# Patient Record
Sex: Female | Born: 1980
Health system: Southern US, Community
[De-identification: ages and names within clinical notes are randomized; demographics above are authoritative.]

## PROBLEM LIST (undated history)

## (undated) DIAGNOSIS — F4310 Post-traumatic stress disorder, unspecified: Secondary | ICD-10-CM

## (undated) DIAGNOSIS — K047 Periapical abscess without sinus: Secondary | ICD-10-CM

## (undated) DIAGNOSIS — R002 Palpitations: Secondary | ICD-10-CM

## (undated) DIAGNOSIS — F329 Major depressive disorder, single episode, unspecified: Secondary | ICD-10-CM

## (undated) DIAGNOSIS — F419 Anxiety disorder, unspecified: Secondary | ICD-10-CM

## (undated) DIAGNOSIS — G4700 Insomnia, unspecified: Secondary | ICD-10-CM

## (undated) DIAGNOSIS — F32A Depression, unspecified: Secondary | ICD-10-CM

## (undated) DIAGNOSIS — K219 Gastro-esophageal reflux disease without esophagitis: Secondary | ICD-10-CM

## (undated) DIAGNOSIS — D649 Anemia, unspecified: Secondary | ICD-10-CM

## (undated) HISTORY — DX: Gastro-esophageal reflux disease without esophagitis: K21.9

## (undated) HISTORY — PX: BREAST REDUCTION SURGERY: SHX8

## (undated) HISTORY — DX: Depression, unspecified: F32.A

## (undated) HISTORY — PX: CHOLECYSTECTOMY: SHX55

## (undated) HISTORY — DX: Major depressive disorder, single episode, unspecified: F32.9

## (undated) HISTORY — DX: Anxiety disorder, unspecified: F41.9

## (undated) HISTORY — PX: WISDOM TOOTH EXTRACTION: SHX21

## (undated) HISTORY — DX: Palpitations: R00.2

## (undated) HISTORY — DX: Insomnia, unspecified: G47.00

## (undated) HISTORY — PX: OTHER SURGICAL HISTORY: SHX169

---
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
## 1999-06-05 ENCOUNTER — Encounter: Admission: RE | Admit: 1999-06-05 | Discharge: 1999-09-03 | Payer: Self-pay | Admitting: Pediatrics

## 1999-09-20 ENCOUNTER — Encounter: Admission: RE | Admit: 1999-09-20 | Discharge: 1999-12-19 | Payer: Self-pay | Admitting: Pediatrics

## 2005-09-28 ENCOUNTER — Emergency Department (HOSPITAL_COMMUNITY): Admission: EM | Admit: 2005-09-28 | Discharge: 2005-09-29 | Payer: Self-pay | Admitting: Emergency Medicine

## 2005-10-27 ENCOUNTER — Emergency Department (HOSPITAL_COMMUNITY): Admission: EM | Admit: 2005-10-27 | Discharge: 2005-10-27 | Payer: Self-pay | Admitting: *Deleted

## 2013-04-01 ENCOUNTER — Emergency Department (HOSPITAL_COMMUNITY)
Admission: EM | Admit: 2013-04-01 | Discharge: 2013-04-01 | Disposition: A | Discharge status: Home / Self Care | Payer: Self-pay | Attending: Emergency Medicine | Admitting: Emergency Medicine

## 2013-04-01 ENCOUNTER — Encounter (HOSPITAL_COMMUNITY): Payer: Self-pay | Admitting: Emergency Medicine

## 2013-04-01 DIAGNOSIS — R110 Nausea: Secondary | ICD-10-CM | POA: Insufficient documentation

## 2013-04-01 DIAGNOSIS — Z862 Personal history of diseases of the blood and blood-forming organs and certain disorders involving the immune mechanism: Secondary | ICD-10-CM | POA: Insufficient documentation

## 2013-04-01 DIAGNOSIS — R51 Headache: Secondary | ICD-10-CM | POA: Insufficient documentation

## 2013-04-01 DIAGNOSIS — Z87891 Personal history of nicotine dependence: Secondary | ICD-10-CM | POA: Insufficient documentation

## 2013-04-01 DIAGNOSIS — R42 Dizziness and giddiness: Secondary | ICD-10-CM | POA: Insufficient documentation

## 2013-04-01 DIAGNOSIS — H538 Other visual disturbances: Secondary | ICD-10-CM | POA: Insufficient documentation

## 2013-04-01 DIAGNOSIS — Z8639 Personal history of other endocrine, nutritional and metabolic disease: Secondary | ICD-10-CM | POA: Insufficient documentation

## 2013-04-01 HISTORY — DX: Anemia, unspecified: D64.9

## 2013-04-01 MED ORDER — ASPIRIN-ACETAMINOPHEN-CAFFEINE 250-250-65 MG PO TABS
2.0000 | ORAL_TABLET | Freq: Once | ORAL | Status: AC
  Administered 2013-04-01: 2 via ORAL
  Filled 2013-04-01: qty 2

## 2013-04-01 MED ORDER — ASPIRIN-ACETAMINOPHEN-CAFFEINE 250-250-65 MG PO TABS: 1.0000 | ORAL_TABLET | Freq: Four times a day (QID) | ORAL | Status: DC | PRN

## 2013-04-01 NOTE — ED Notes (Signed)
Pt having a lot of "head pressure" for last couple weeks. Doc out of town, Lowella Grip be back till Wednesday. "Makes me feel faint", c/o nausea, denies vomiting. Pt states she feels off balanced sometimes and a little blurred vision. States shes had headaches before, but this headache "feels different". Pt went to the ER a few weeks ago, dx with fluid in ears. No medications PTA. Pt ambulatory with steady gait to room.

## 2013-04-01 NOTE — ED Provider Notes (Signed)
CSN: [id number]     Arrival date & time 04/01/13  4098 History   First MD Initiated Contact with Patient 04/01/13 [phone]     Chief Complaint  Patient presents with  . Headache   (Consider location/radiation/quality/duration/timing/severity/associated sxs/prior Treatment) HPI Comments: Patient is a 32 yo F PMHx significant for Anemia presenting to the ED for 3 weeks of intermittent generalized headaches that she describes as pressure. She denies having a headache currently. Patient reports intermittent associated nausea w/o vomiting, lightheadness w/o LOC, and one episode of resolving blurred vision. Patient states she feels as though the lightheadedness is related to her anemia as she sometimes has gotten that before when it hasn't been controlled. Patient states she was seen recently and diagnosed with a sinus infection and placed on an antibiotic without any improvement. Patient denies any aggravating factors. She states she tried Tylenol once last night with improvement of her headache, but denies using any other OTC medications prior.    Past Medical History  Diagnosis Date  . Anemia   . High cholesterol    Past Surgical History  Procedure Laterality Date  . Breast reduction surgery    . Gallstone removal    . Wisdom tooth extraction     No family history on file. History  Substance Use Topics  . Smoking status: Former Games developer  . Smokeless tobacco: Not on file  . Alcohol Use: No   OB History   Grav Para Term Preterm Abortions TAB SAB Ect Mult Living                 Review of Systems  Constitutional: Negative for fever.  Eyes: Negative for photophobia, pain and visual disturbance.  Respiratory: Negative for shortness of breath.   Cardiovascular: Negative for chest pain.  Gastrointestinal: Positive for nausea. Negative for vomiting and abdominal pain.  Musculoskeletal: Negative for neck pain.  Neurological: Positive for headaches. Negative for syncope, weakness and numbness.   All other systems reviewed and are negative.    Allergies  Aspirin  Home Medications   Current Outpatient Rx  Name  Route  Sig  Dispense  Refill  . Multiple Vitamins-Minerals (MULTIVITAMIN WITH MINERALS) tablet   Oral   Take 1 tablet by mouth daily.         Marland Kitchen aspirin-acetaminophen-caffeine (EXCEDRIN MIGRAINE) 250-250-65 MG per tablet   Oral   Take 1-2 tablets by mouth every 6 (six) hours as needed for pain. Take with food.   30 tablet   0    BP 115/74  Pulse 72  Temp(Src) 97.9 F (36.6 C) (Oral)  Resp 18  Ht 5\' 6"  (1.676 m)  Wt 145 lb (65.772 kg)  BMI 23.41 kg/m2  SpO2 98%  LMP 04/01/2013 Physical Exam  Constitutional: She is oriented to person, place, and time. She appears well-developed and well-nourished. No distress.  Patient in room with all lights on and on her cellphone.   HENT:  Head: Normocephalic and atraumatic.  Right Ear: Tympanic membrane and external ear normal.  Left Ear: Tympanic membrane and external ear normal.  Nose: Nose normal.  Mouth/Throat: Uvula is midline, oropharynx is clear and moist and mucous membranes are normal. No oropharyngeal exudate.  Eyes: Conjunctivae and EOM are normal. Pupils are equal, round, and reactive to light.  Neck: Normal range of motion and full passive range of motion without pain. No spinous process tenderness and no muscular tenderness present. No tracheal deviation present.  Cardiovascular: Normal rate, regular rhythm, normal heart sounds and  intact distal pulses.   Pulmonary/Chest: Effort normal and breath sounds normal. No stridor. No respiratory distress.  Abdominal: Soft. Bowel sounds are normal. She exhibits no distension. There is no tenderness. There is no rebound and no guarding.  Musculoskeletal: Normal range of motion. She exhibits no tenderness.  Lymphadenopathy:    She has no cervical adenopathy.  Neurological: She is alert and oriented to person, place, and time. She has normal strength. No cranial  nerve deficit or sensory deficit. She displays a negative Romberg sign. Coordination and gait normal. GCS eye subscore is 4. GCS verbal subscore is 5. GCS motor subscore is 6.  No pronator drift. Bilateral heel-knee-shin intact. Bilateral finger-nose-finger intact.   Skin: Skin is warm and dry. She is not diaphoretic.  Psychiatric: She has a normal mood and affect.    ED Course  Procedures (including critical care time)  Medications  aspirin-acetaminophen-caffeine (EXCEDRIN MIGRAINE) per tablet 2 tablet (2 tablets Oral Given 04/01/13 1105)    Labs Review Labs Reviewed - No data to display Imaging Review No results found.   MDM   1. Headache    Afebrile, NAD, non-toxic appearing, AAOx4. Pt HA treated and improved while in ED.  Presentation is non concerning for [facility], ICH, Meningitis, or temporal arteritis. Pt is afebrile with no focal neuro deficits, nuchal rigidity, or change in vision. Pt symptoms improved and is requesting to go home now. Pt is to obtain a PCP for follow up to discuss prophylactic medication. Pt verbalizes understanding and is agreeable with plan to dc. Patient is stable at time of discharge.       Jeannetta Ellis, PA-C 04/01/13 1538  Lise Auer Ireanna Finlayson, PA-C 04/01/13 1539

## 2013-04-01 NOTE — ED Notes (Signed)
Report received, assumed care.  

## 2013-04-03 NOTE — ED Provider Notes (Signed)
Medical screening examination/treatment/procedure(s) were performed by non-physician practitioner and as supervising physician I was immediately available for consultation/collaboration.   Candyce Churn, MD 04/03/13 1539

## 2013-04-22 ENCOUNTER — Encounter: Payer: Self-pay | Admitting: Cardiovascular Disease

## 2013-04-22 ENCOUNTER — Encounter: Payer: Self-pay | Admitting: *Deleted

## 2013-04-22 DIAGNOSIS — D649 Anemia, unspecified: Secondary | ICD-10-CM | POA: Insufficient documentation

## 2013-04-22 DIAGNOSIS — K219 Gastro-esophageal reflux disease without esophagitis: Secondary | ICD-10-CM | POA: Insufficient documentation

## 2013-04-22 DIAGNOSIS — F329 Major depressive disorder, single episode, unspecified: Secondary | ICD-10-CM | POA: Insufficient documentation

## 2013-04-22 DIAGNOSIS — R002 Palpitations: Secondary | ICD-10-CM | POA: Insufficient documentation

## 2013-04-22 DIAGNOSIS — G4700 Insomnia, unspecified: Secondary | ICD-10-CM | POA: Insufficient documentation

## 2013-04-22 DIAGNOSIS — F419 Anxiety disorder, unspecified: Secondary | ICD-10-CM | POA: Insufficient documentation

## 2013-04-22 DIAGNOSIS — E7800 Pure hypercholesterolemia, unspecified: Secondary | ICD-10-CM | POA: Insufficient documentation

## 2013-04-23 ENCOUNTER — Ambulatory Visit (INDEPENDENT_AMBULATORY_CARE_PROVIDER_SITE_OTHER): Payer: No Typology Code available for payment source | Admitting: Cardiovascular Disease

## 2013-04-23 ENCOUNTER — Encounter: Payer: Self-pay | Admitting: Cardiovascular Disease

## 2013-04-23 VITALS — BP 119/78 | HR 86 | Ht 66.0 in | Wt 143.0 lb

## 2013-04-23 DIAGNOSIS — R079 Chest pain, unspecified: Secondary | ICD-10-CM | POA: Insufficient documentation

## 2013-04-23 DIAGNOSIS — R002 Palpitations: Secondary | ICD-10-CM

## 2013-04-23 DIAGNOSIS — E7800 Pure hypercholesterolemia, unspecified: Secondary | ICD-10-CM

## 2013-04-23 NOTE — Assessment & Plan Note (Signed)
Atypical normal ECG Given mothers sudden MI at age 34 and patient anxiety over heart disease will order ETT

## 2013-04-23 NOTE — Progress Notes (Signed)
Patient ID: Patricia Wilcox, female   DOB: May 18, 1981, 32 y.o.   MRN: [id number] 32 yo referred by [place] with [facility]. Having palpitations and chest pain.  Mother died of MI at age of 40.  Known anxiety disorder.  Has numbness in arms, palpitations not related to exertion Can last hours.  Thyroid function and Hct normal on recent labs reviewed.  Has been seen by mental health but not helping ECG 10/15 at primary office normal.  Chest pain intermitant Lasts minutes. Not related to exertion.  Has been going on for over a year.    LDL 124  Seen in ER 10/9 with headache and anemia  Hct 35.8  Has some GI symptoms and GERD  Using excedrine migraine   ROS: Denies fever, malais, weight loss, blurry vision, decreased visual acuity, cough, sputum, SOB, hemoptysis, pleuritic pain, palpitaitons, heartburn, abdominal pain, melena, lower extremity edema, claudication, or rash.  All other systems reviewed and negative   General: Affect appropriate Healthy:  appears stated age HEENT: normal Neck supple with no adenopathy JVP normal no bruits no thyromegaly Lungs clear with no wheezing and good diaphragmatic motion Heart:  S1/S2 no murmur,rub, gallop or click PMI normal Abdomen: benighn, BS positve, no tenderness, no AAA no bruit.  No HSM or HJR Distal pulses intact with no bruits No edema Neuro non-focal Skin warm and dry No muscular weakness  Medications Current Outpatient Prescriptions  Medication Sig Dispense Refill  . aspirin-acetaminophen-caffeine (EXCEDRIN MIGRAINE) 250-250-65 MG per tablet Take 1-2 tablets by mouth every 6 (six) hours as needed for pain. Take with food.  30 tablet  0  . Multiple Vitamins-Minerals (MULTIVITAMIN WITH MINERALS) tablet Take 1 tablet by mouth daily.       No current facility-administered medications for this visit.    Allergies Aspirin  Family History: No family history on file.  Social History: History   Social History  .  Marital Status: Single    Spouse Name: N/A    Number of Children: N/A  . Years of Education: N/A   Occupational History  . Not on file.   Social History Main Topics  . Smoking status: Former Games developer  . Smokeless tobacco: Not on file  . Alcohol Use: No  . Drug Use: No  . Sexual Activity: Not on file   Other Topics Concern  . Not on file   Social History Narrative  . No narrative on file    Electrocardiogram:  NSR normal ECG 10/15 primary office   Assessment and Plan

## 2013-04-23 NOTE — Assessment & Plan Note (Signed)
Cholesterol is at goal.  Continue current dose of statin and diet Rx.  No myalgias or side effects.  F/U  LFT's in 6 months. No results found for this basename: LDLCALC             

## 2013-04-23 NOTE — Patient Instructions (Signed)

## 2013-04-23 NOTE — Assessment & Plan Note (Signed)
Benign related to anxiety no need for monitor

## 2013-05-07 ENCOUNTER — Ambulatory Visit (HOSPITAL_COMMUNITY)
Admission: RE | Admit: 2013-05-07 | Discharge: 2013-05-07 | Disposition: A | Discharge status: Home / Self Care | Payer: No Typology Code available for payment source | Source: Ambulatory Visit | Attending: Cardiovascular Disease | Admitting: Cardiovascular Disease

## 2013-05-07 ENCOUNTER — Encounter: Payer: Self-pay | Admitting: Physician Assistant

## 2013-05-07 DIAGNOSIS — R079 Chest pain, unspecified: Secondary | ICD-10-CM | POA: Insufficient documentation

## 2013-05-07 DIAGNOSIS — R5381 Other malaise: Secondary | ICD-10-CM | POA: Insufficient documentation

## 2013-05-07 DIAGNOSIS — R0609 Other forms of dyspnea: Secondary | ICD-10-CM | POA: Insufficient documentation

## 2013-05-07 DIAGNOSIS — F17200 Nicotine dependence, unspecified, uncomplicated: Secondary | ICD-10-CM | POA: Insufficient documentation

## 2013-05-07 DIAGNOSIS — R002 Palpitations: Secondary | ICD-10-CM | POA: Insufficient documentation

## 2013-05-07 DIAGNOSIS — R0989 Other specified symptoms and signs involving the circulatory and respiratory systems: Secondary | ICD-10-CM | POA: Insufficient documentation

## 2013-05-07 NOTE — Progress Notes (Signed)
Outpatient ETT performed in the hospital. Pt exercised 11:21 on Bruce protocol without acute EKG changes. Excellent exercise tolerance. No CP. Reached target HR (test terminated due to dyspnea). Tracings sent to EKG reader for final review and population of report in Epic. Oneka Parada PA-C

## 2013-08-03 ENCOUNTER — Other Ambulatory Visit: Payer: Self-pay | Admitting: *Deleted

## 2013-08-03 DIAGNOSIS — N949 Unspecified condition associated with female genital organs and menstrual cycle: Secondary | ICD-10-CM

## 2013-08-03 DIAGNOSIS — R1084 Generalized abdominal pain: Secondary | ICD-10-CM

## 2013-08-05 ENCOUNTER — Encounter: Payer: Self-pay | Admitting: Obstetrics & Gynecology

## 2013-08-05 ENCOUNTER — Encounter: Payer: Self-pay | Admitting: Obstetrics and Gynecology

## 2013-08-11 ENCOUNTER — Encounter: Payer: Self-pay | Admitting: *Deleted

## 2013-08-18 ENCOUNTER — Ambulatory Visit (HOSPITAL_COMMUNITY): Admission: RE | Admit: 2013-08-18 | Payer: No Typology Code available for payment source | Source: Ambulatory Visit

## 2013-08-18 ENCOUNTER — Ambulatory Visit (HOSPITAL_COMMUNITY): Payer: No Typology Code available for payment source | Attending: Obstetrics & Gynecology

## 2013-09-02 ENCOUNTER — Ambulatory Visit (HOSPITAL_COMMUNITY): Admission: RE | Admit: 2013-09-02 | Payer: No Typology Code available for payment source | Source: Ambulatory Visit

## 2013-09-15 ENCOUNTER — Encounter: Payer: No Typology Code available for payment source | Admitting: Obstetrics & Gynecology

## 2013-11-26 ENCOUNTER — Encounter (HOSPITAL_COMMUNITY): Payer: Self-pay | Admitting: Emergency Medicine

## 2013-11-26 ENCOUNTER — Emergency Department (HOSPITAL_COMMUNITY): Payer: Self-pay

## 2013-11-26 ENCOUNTER — Emergency Department (HOSPITAL_COMMUNITY)
Admission: EM | Admit: 2013-11-26 | Discharge: 2013-11-26 | Disposition: A | Discharge status: Home / Self Care | Payer: Self-pay | Attending: Emergency Medicine | Admitting: Emergency Medicine

## 2013-11-26 DIAGNOSIS — D649 Anemia, unspecified: Secondary | ICD-10-CM | POA: Insufficient documentation

## 2013-11-26 DIAGNOSIS — Z8639 Personal history of other endocrine, nutritional and metabolic disease: Secondary | ICD-10-CM | POA: Insufficient documentation

## 2013-11-26 DIAGNOSIS — R071 Chest pain on breathing: Secondary | ICD-10-CM | POA: Insufficient documentation

## 2013-11-26 DIAGNOSIS — Z8659 Personal history of other mental and behavioral disorders: Secondary | ICD-10-CM | POA: Insufficient documentation

## 2013-11-26 DIAGNOSIS — Z791 Long term (current) use of non-steroidal anti-inflammatories (NSAID): Secondary | ICD-10-CM | POA: Insufficient documentation

## 2013-11-26 DIAGNOSIS — Z8719 Personal history of other diseases of the digestive system: Secondary | ICD-10-CM | POA: Insufficient documentation

## 2013-11-26 DIAGNOSIS — Z9889 Other specified postprocedural states: Secondary | ICD-10-CM | POA: Insufficient documentation

## 2013-11-26 DIAGNOSIS — G4700 Insomnia, unspecified: Secondary | ICD-10-CM | POA: Insufficient documentation

## 2013-11-26 DIAGNOSIS — Z87891 Personal history of nicotine dependence: Secondary | ICD-10-CM | POA: Insufficient documentation

## 2013-11-26 DIAGNOSIS — Z862 Personal history of diseases of the blood and blood-forming organs and certain disorders involving the immune mechanism: Secondary | ICD-10-CM | POA: Insufficient documentation

## 2013-11-26 DIAGNOSIS — R079 Chest pain, unspecified: Secondary | ICD-10-CM

## 2013-11-26 MED ORDER — ACETAMINOPHEN 325 MG PO TABS
650.0000 mg | ORAL_TABLET | Freq: Once | ORAL | Status: AC
  Administered 2013-11-26: 650 mg via ORAL
  Filled 2013-11-26: qty 2

## 2013-11-26 MED ORDER — KETOROLAC TROMETHAMINE 30 MG/ML IJ SOLN
30.0000 mg | Freq: Once | INTRAMUSCULAR | Status: DC
  Filled 2013-11-26: qty 1

## 2013-11-26 NOTE — ED Notes (Signed)
Pt Md Nanavati, new ST depression noted. Acuity changed per MD.

## 2013-11-26 NOTE — ED Provider Notes (Signed)
CSN: [id number]     Arrival date & time 11/26/13  1736 History   First MD Initiated Contact with Patient 11/26/13 2012     Chief Complaint  Patient presents with  . Chest Pain     (Consider location/radiation/quality/duration/timing/severity/associated sxs/prior Treatment) Patient is a 33 y.o. female presenting with general illness. The history is provided by the patient.  Illness Severity:  Moderate Onset quality:  Sudden Timing:  Constant Progression:  Resolved Chronicity:  New Associated symptoms: chest pain   Associated symptoms: no abdominal pain, no congestion, no cough, no diarrhea, no fever, no headaches, no nausea, no rash, no rhinorrhea, no shortness of breath, no vomiting and no wheezing     33 yo F with difficulty taking big breath. Onset today while walking. Felt difficult to take big breath. Denies actual SOB. Also with left lateral chest pain. Intermittent. Multiple months. No change today.  Also with central chest discomfort intermittently (last time 3 weeks ago). Sometimes burning with radiation to throat. Known h/o reflux. Feels similar. Reports negative stress test few months ago. Mother died of MI in mid 52's.  Cough couple weeks ago. Was on abx for pna/bronchitis. Cough improved. No fevers.  No urinary complaints.     Past Medical History  Diagnosis Date  . Anemia   . High cholesterol   . Anxiety   . Depression   . Insomnia   . Palpitation   . GERD (gastroesophageal reflux disease)    Past Surgical History  Procedure Laterality Date  . Breast reduction surgery    . Gallstone removal    . Wisdom tooth extraction     No family history on file. History  Substance Use Topics  . Smoking status: Former Games developer  . Smokeless tobacco: Not on file  . Alcohol Use: No   OB History   Grav Para Term Preterm Abortions TAB SAB Ect Mult Living                 Review of Systems  Constitutional: Negative for fever and chills.  HENT: Negative for congestion  and rhinorrhea.   Respiratory: Negative for cough, shortness of breath and wheezing.   Cardiovascular: Positive for chest pain. Negative for palpitations and leg swelling.  Gastrointestinal: Negative for nausea, vomiting, abdominal pain and diarrhea.  Genitourinary: Negative for dysuria, hematuria, flank pain and difficulty urinating.  Musculoskeletal: Negative for back pain.  Skin: Negative for color change and rash.  Neurological: Negative for dizziness and headaches.  All other systems reviewed and are negative.     Allergies  Aspirin  Home Medications   Prior to Admission medications   Medication Sig Start Date End Date Taking? Authorizing Provider  diphenhydrAMINE (SOMINEX) 25 MG tablet Take 25 mg by mouth at bedtime as needed for sleep.   Yes Historical Provider, MD  IRON PO Take 1 tablet by mouth See admin instructions. Take three days out of the week   Yes Historical Provider, MD  naproxen sodium (ANAPROX) 220 MG tablet Take 220 mg by mouth 2 (two) times daily with a meal.   Yes Historical Provider, MD   BP 130/84  Pulse 79  Temp(Src) 97.7 F (36.5 C) (Oral)  Resp 18  Ht 5\' 5"  (1.651 m)  Wt 148 lb 6.4 oz (67.314 kg)  BMI 24.70 kg/m2  SpO2 100%  LMP 11/12/2013 Physical Exam  Nursing note and vitals reviewed. Constitutional: She is oriented to person, place, and time. She appears well-developed and well-nourished. No distress.  HENT:  Head: Normocephalic and atraumatic.  Eyes: Conjunctivae are normal. Right eye exhibits no discharge. Left eye exhibits no discharge.  Neck: No tracheal deviation present.  Cardiovascular: Normal heart sounds and intact distal pulses.   Pulmonary/Chest: Effort normal and breath sounds normal. No stridor. No respiratory distress. She has no wheezes. She has no rales. She exhibits tenderness (left lateral chest wall. no rash).  Abdominal: Soft. She exhibits no distension. There is no tenderness. There is no guarding.  Musculoskeletal: She  exhibits no edema and no tenderness.  Neurological: She is alert and oriented to person, place, and time.  Skin: Skin is warm and dry.  Psychiatric: She has a normal mood and affect. Her behavior is normal.    ED Course  Procedures (including critical care time) Labs Review Labs Reviewed - No data to display  Imaging Review Dg Chest 2 View  11/26/2013   CLINICAL DATA:  Chest pain with history of bronchitis; recently discontinued smoking  EXAM: CHEST  2 VIEW  COMPARISON:  Chest x-ray dated Nov 14, 2013  FINDINGS: The lungs are well-expanded and clear. The heart and mediastinal structures are normal. The bony thorax is unremarkable.  IMPRESSION: There is no active cardiopulmonary disease.   Electronically Signed   By: David  [place]   On: 11/26/2013 18:28     EKG Interpretation   Date/Time:  Friday November 26 2013 17:49:29 EDT Ventricular Rate:  72 PR Interval:  166 QRS Duration: 86 QT Interval:  380 QTC Calculation: 416 R Axis:   81 Text Interpretation:  Normal sinus rhythm Normal ECG Confirmed by  Rhunette CroftNANAVATI, MD, Janey GentaANKIT ([id number]) on 11/26/2013 9:36:22 PM      MDM   Final diagnoses:  Chest pain    33 yo F with difficulty taking full breath in. Resolved.  HDS, af. NAD. Speaks in full sentences. Sitting up in bed. Smiling.  CXR clear. Without evidence of pna, ptx or other emergent pathology.  PERC negative. Do not suspect PE.  EKG reassuring. Very recent negative stress test. Low risk for ACS. Do not believe troponins or further cardiac w/u indicated at this time.  Patient remained well appearing with normal hemodynamics during ED course.   Patient discharged home. Return precautions given. To follow up with pcp. patient in agreement with plan.  Labs and imaging reviewed by myself and considered in medical decision making if ordered. Imaging interpreted by radiology.   Discussed case with Dr. Rhunette CroftNanavati who is in agreement with assessment and plan.      Stevie Kernyan Lexxus Underhill,  MD 11/27/13 [phone]

## 2013-11-26 NOTE — Discharge Instructions (Signed)
Chest Pain (Nonspecific) °It is often hard to give a specific diagnosis for the cause of chest pain. There is always a chance that your pain could be related to something serious, such as a heart attack or a blood clot in the lungs. You need to follow up with your caregiver for further evaluation. °CAUSES  °· Heartburn. °· Pneumonia or bronchitis. °· Anxiety or stress. °· Inflammation around your heart (pericarditis) or lung (pleuritis or pleurisy). °· A blood clot in the lung. °· A collapsed lung (pneumothorax). It can develop suddenly on its own (spontaneous pneumothorax) or from injury (trauma) to the chest. °· Shingles infection (herpes zoster virus). °The chest wall is composed of bones, muscles, and cartilage. Any of these can be the source of the pain. °· The bones can be bruised by injury. °· The muscles or cartilage can be strained by coughing or overwork. °· The cartilage can be affected by inflammation and become sore (costochondritis). °DIAGNOSIS  °Lab tests or other studies, such as X-rays, electrocardiography, stress testing, or cardiac imaging, may be needed to find the cause of your pain.  °TREATMENT  °· Treatment depends on what may be causing your chest pain. Treatment may include: °· Acid blockers for heartburn. °· Anti-inflammatory medicine. °· Pain medicine for inflammatory conditions. °· Antibiotics if an infection is present. °· You may be advised to change lifestyle habits. This includes stopping smoking and avoiding alcohol, caffeine, and chocolate. °· You may be advised to keep your head raised (elevated) when sleeping. This reduces the chance of acid going backward from your stomach into your esophagus. °· Most of the time, nonspecific chest pain will improve within 2 to 3 days with rest and mild pain medicine. °HOME CARE INSTRUCTIONS  °· If antibiotics were prescribed, take your antibiotics as directed. Finish them even if you start to feel better. °· For the next few days, avoid physical  activities that bring on chest pain. Continue physical activities as directed. °· Do not smoke. °· Avoid drinking alcohol. °· Only take over-the-counter or prescription medicine for pain, discomfort, or fever as directed by your caregiver. °· Follow your caregiver's suggestions for further testing if your chest pain does not go away. °· Keep any follow-up appointments you made. If you do not go to an appointment, you could develop lasting (chronic) problems with pain. If there is any problem keeping an appointment, you must call to reschedule. °SEEK MEDICAL CARE IF:  °· You think you are having problems from the medicine you are taking. Read your medicine instructions carefully. °· Your chest pain does not go away, even after treatment. °· You develop a rash with blisters on your chest. °SEEK IMMEDIATE MEDICAL CARE IF:  °· You have increased chest pain or pain that spreads to your arm, neck, jaw, back, or abdomen. °· You develop shortness of breath, an increasing cough, or you are coughing up blood. °· You have severe back or abdominal pain, feel nauseous, or vomit. °· You develop severe weakness, fainting, or chills. °· You have a fever. °THIS IS AN EMERGENCY. Do not wait to see if the pain will go away. Get medical help at once. Call your local emergency services (911 in [place] Do not drive yourself to the hospital. °MAKE SURE YOU:  °· Understand these instructions. °· Will watch your condition. °· Will get help right away if you are not doing well or get worse. °Document Released: 03/20/2005 Document Revised: 09/02/2011 Document Reviewed: 01/14/2008 °ExitCare® Patient Information ©2014 [employer],   LLC. ° ° ° °Emergency Department Resource Guide °1) Find a Doctor and Pay Out of Pocket °Although you won't have to find out who is covered by your insurance plan, it is a good idea to ask around and get recommendations. You will then need to call the office and see if the doctor you have chosen will accept you as a new  patient and what types of options they offer for patients who are self-pay. Some doctors offer discounts or will set up payment plans for their patients who do not have insurance, but you will need to ask so you aren't surprised when you get to your appointment. ° °2) Contact Your Local Health Department °Not all health departments have doctors that can see patients for sick visits, but many do, so it is worth a call to see if yours does. If you don't know where your local health department is, you can check in your phone book. The CDC also has a tool to help you locate your state's health department, and many state websites also have listings of all of their local health departments. ° °3) Find a Walk-in Clinic °If your illness is not likely to be very severe or complicated, you may want to try a walk in clinic. These are popping up all over the country in pharmacies, drugstores, and shopping centers. They're usually staffed by nurse practitioners or physician assistants that have been trained to treat common illnesses and complaints. They're usually fairly quick and inexpensive. However, if you have serious medical issues or chronic medical problems, these are probably not your best option. ° °No Primary Care Doctor: °- Call [facility] at  832-8000 - they can help you locate a primary care doctor that  accepts your insurance, provides certain services, etc. °- Physician Referral Service- 1-800-533-3463 ° °Chronic Pain Problems: °Organization         Address  Phone   Notes  °[facility]  (336) 297-2271 Patients need to be referred by their primary care doctor.  ° °Medication Assistance: °Organization         Address  Phone   Notes  °[place] [facility] [address] °[place], [place] [address] (336) 641-8030 --Must be a resident of [place] °-- Must have NO insurance coverage whatsoever (no Medicaid/ Medicare, etc.) °-- The pt. MUST have a primary  care doctor that directs their care regularly and follows them in the community °  °[facility]  (866) 331-1348   °[employer]  (888) 892-1162   ° °Agencies that provide inexpensive medical care: °Organization         Address  Phone   Notes  °[facility]  (336) 832-8035   °[facility]    (336) 832-7272   °[facility] [address] °[place], [place] [address] (336) 832-4777   °[facility] [address], °[place] (336) 271-4999   °[facility]    (336) 373-0678   °[facility]    (336) 272-1050   °[facility] ° [address], [place] Phone:  (336) 832-4444, Fax:  (336) 832-4440 Hours of Operation:  9 am - 6 pm, M-F.  Also accepts Medicaid/Medicare and self-pay.  °[facility] ° [address],  Phone: (336) 832-3150, Fax: (336) 832-3151. Hours of Operation:  8:30 am - 5:30 pm, M-F.  Also accepts Medicaid and self-pay.  °  [facility] [place] [address], [place] Phone: (336) 878-6027   °[facility] [address], [place], [place] (336)723-1848, Ext. 123 Mondays & Thursdays: 7-9 AM.  First 15 patients are seen on a first come, first serve basis. °  ° °Medicaid-accepting [place] Providers: ° °Organization         Address  Phone   Notes  °[facility] [address], [place] (336) 641-2100 Also accepts self-pay patients.  °[facility] [address], [place] ° (336) 856-9996   °[facility] [address], [place] (336) 288-8857   °[facility] [address] [place] [address], [place] (336) 299-7000   °Veita Bland [address], [place]  ° (336) 373-1557 Only accepts [place] Access Medicaid patients after they have their name applied to their card.  ° °Self-Pay (no insurance) in [place]: ° °Organization         Address  Phone   Notes  °Sickle Cell Patients, [facility] [address], [place] (336) 832-1970   °[facility] [address], [place] (336) 832-4400   ° [facility] ° [address], [place] (336) 992-4800   °[facility]/Dr. Osei-Bonsu ° [address] [place] [address], [place] or [address], [place] (336) [phone] Phone number for both [place] and [place] locations is the same.  °[facility] [address], [place] (336) 299-0000   °[facility] [address] [place] [address], [place] or [address] (336) 852-7530 °(336) 878-2260   °[facility] [address], [place] (336) 350-1642, phone; (336) 294-5005, fax Sees patients 1st and 3rd Saturday of every month.  Must not qualify for public or private insurance (i.e. Medicaid, Medicare, [place] Health Choice, Veterans' Benefits) • Household income should be no more than 200% of the poverty level •The clinic cannot treat you if you are pregnant or think you are pregnant • Sexually transmitted diseases are not treated at the clinic.  ° ° °Dental Care: °Organization         Address  Phone  Notes  °[facility] [address], [place] (336) 641-6152 Accepts children up to age 21 who are enrolled in Medicaid or [place] Health Choice; pregnant women with a Medicaid card; and children who have applied for Medicaid or [place] Health Choice, but were declined, whose parents can pay a reduced fee at time of service.  °[facility] [place]  [address], [place] (336) 641-7733 Accepts children up to age 21 who are enrolled in Medicaid or [place] Health Choice; pregnant women with a Medicaid card; and children who have applied for Medicaid or [place] Health Choice, but were declined, whose parents can  pay a reduced fee at time of service.  °[facility] ° [address], [place] (336) 641-4533 Patients are seen by appointment only. Walk-ins are not accepted. [facility] will see patients 18 years of age and older. °Monday - Tuesday (8am-5pm) °Most Wednesdays (8:30-5pm) °$30 per visit, cash only  °[facility] ° [address], [place] (336) 641-4533 Patients are seen by appointment only. Walk-ins are not accepted. [facility] will see patients 18 years of   age and older. °One Wednesday Evening (Monthly: Volunteer Based).  $30 per visit, cash only  °[facility]  (919) 537-3737 for adults; Children under age 4, call Graduate Pediatric Dentistry at (919) [phone]. Children aged 4-14, please call (919) [phone] to request a pediatric application. ° Dental services are provided in all areas of dental care including fillings, crowns and bridges, complete and partial dentures, implants, gum treatment, root canals, and extractions. Preventive care is also provided. Treatment is provided to both adults and children. °Patients are selected via a lottery and there is often a waiting list. °  °[facility] [address], °[place] ° (336) 763-8833 [email or web address] °  °[facility] [address], [place], [place] (336)723-1848, Ext. 123 Second and Fourth Thursday of each month, opens at 6:30 AM; Clinic ends at 9 AM.  Patients are seen on a first-come first-served basis, and a limited number are seen during each clinic.  ° °[facility] ° [address], [place], [place] (336) 723-7904   Eligibility Requirements °You must have lived in [place], [place], or [place] counties for at least the last three months. °  You cannot be eligible for state or federal sponsored healthcare insurance, including Veterans Administration, Medicaid, or Medicare. °  You generally cannot be eligible for healthcare  insurance through your employer.  °  How to apply: °Eligibility screenings are held every Tuesday and Wednesday afternoon from 1:00 pm until 4:00 pm. You do not need an appointment for the interview!  °[facility] [address], [place], [place] 336-631-2330   °[facility]  336-342-8273   °[facility]  336-703-3100   °[facility]  336-570-6415   ° °Behavioral Health Resources in the Community: °Intensive Outpatient Programs °Organization         Address  Phone  Notes  °[facility] [address], [place], [place] 336-878-6098   °[facility] [address], [place], [place] 336-832-9800   °[facility] [address], [place], [place] ° 336-882-2125   °[facility] [address],  °[place], [place] 1-800-853-5163 or 336-641-4981   °Substance Abuse Resources °Organization         Address  Phone  Notes  °[facility]  336-882-2125   °[facility]  336-784-9470   °[facility]  336-285-9073   °[facility]  336-845-3988   °Residential & Outpatient Substance Abuse Program  1-800-659-3381   °Psychological Services °Organization         Address  Phone  Notes  ° Health  336- 832-9600   °[facility]  336- 378-7881   °[facility] [address], [place] 1-800-853-5163 or 336-641-4981   ° °Mobile Crisis Teams °Organization         Address  Phone  Notes  °[facility], Mobile Crisis Care Unit  1-877-626-1772   °Assertive °[facility] ° [address] [place], [place] 336-834-9664   °Sharon DeEsch [address] °[place] 336-554-5454   ° °Self-Help/Support Groups °Organization         Address  Phone             Notes  °[facility] - variety of support groups  336- 373-1402 Call for more information  °Narcotics Anonymous (NA),  [facility] [address], °[place]  2   meetings at this location  ° °Residential Treatment Programs °Organization         Address  Phone  Notes  °[facility] [address],    °[place]  1-866-801-8205   °[facility] ° [address], [place], [place] 704-293-8524   °[facility] [address], [place] 336-845-3988 Admissions: 8am-3pm M-F  °[facility] [address]    °[place], [place] 336-841-1104   °[facility] [address], [place], [place] 336-379-7146   °[facility] [address]  °[place], [place] 336-285-9073   °[facility] Intensive Outpatient [address], [place], [place] 336-852-3033   °[facility] [address]  °[place], [place] 1-877-615-2722 or 336-784-9470   °[facility] [address] [place], [place] 336-227-7417 Accepts Medicaid  °[facility] [address]  °[place] 1-800-659-3381 Substance Abuse/Addiction Treatment  ° °[place] Behavioral Health Resources °Organization         Address  Phone  Notes  °[facility]  (888) 581-9988   °Julie Brannon, PhD [address] [place], [place]   (336) 349-5553 or (336) 951-0000   °[facility]   [address] °[place], [place] (336) 349-4454   °[facility] [address], [place], [place] (336) 342-8316 Insurance/Medicaid/sponsorship through [facility]  °[facility] [address]                                    [place], [place] (336) 342-8316 Therapy/tele-psych/case  °[facility] [address]  ° [place], [place] (336) 349-2233    °Dr. Arfeen  (336) 349-4544   °[facility] [place]  [employer] [place] [facility] 1) [address], [place] °2) [address], [place] °3)  [address], [place] (336) 349-3220 °(336) 342-7768 ° °(336) 342-8140    °[place] [facility] (336) 342-1394 or (336) 342-3537 (After Hours)    ° ° ° °

## 2013-11-26 NOTE — ED Notes (Signed)
The pt is c/o lt sided chest pain for 2 days with sob.  No sob now.  No cardiac history.  Hx pneumonia 2 weeks ago

## 2013-11-26 NOTE — ED Notes (Signed)
Back from xray

## 2013-11-26 NOTE — ED Notes (Signed)
Pt reassessed. PT denies any chest pain but does reports some continued tightness.

## 2013-11-26 NOTE — ED Notes (Signed)
Going to xray 

## 2013-11-27 NOTE — ED Provider Notes (Signed)
I performed a history and physical examination of  Patricia Wilcox and discussed her management with Dr. Roxy Cedar. I agree with the history, physical, assessment, and plan of care, with the following exceptions: None I was present for the following procedures: None  Time Spent in Critical Care of the patient: None  Time spent in discussions with the patient and family: 5 min  Bryley Kovacevic  Pt comes in with cc of dib. Neg stress recently non focal cardio-vascular exam. Symptoms free. Discussed with resident physician, and agree with the plan. Stable for dc  Derwood Kaplan, MD 11/27/13 1021

## 2014-07-13 ENCOUNTER — Emergency Department (HOSPITAL_BASED_OUTPATIENT_CLINIC_OR_DEPARTMENT_OTHER): Payer: Self-pay

## 2014-07-13 ENCOUNTER — Emergency Department (HOSPITAL_BASED_OUTPATIENT_CLINIC_OR_DEPARTMENT_OTHER)
Admission: EM | Admit: 2014-07-13 | Discharge: 2014-07-13 | Disposition: A | Discharge status: Home / Self Care | Payer: Self-pay | Attending: Emergency Medicine | Admitting: Emergency Medicine

## 2014-07-13 ENCOUNTER — Encounter (HOSPITAL_BASED_OUTPATIENT_CLINIC_OR_DEPARTMENT_OTHER): Payer: Self-pay | Admitting: *Deleted

## 2014-07-13 DIAGNOSIS — Z8639 Personal history of other endocrine, nutritional and metabolic disease: Secondary | ICD-10-CM | POA: Insufficient documentation

## 2014-07-13 DIAGNOSIS — D649 Anemia, unspecified: Secondary | ICD-10-CM | POA: Insufficient documentation

## 2014-07-13 DIAGNOSIS — K219 Gastro-esophageal reflux disease without esophagitis: Secondary | ICD-10-CM | POA: Insufficient documentation

## 2014-07-13 DIAGNOSIS — Z8659 Personal history of other mental and behavioral disorders: Secondary | ICD-10-CM | POA: Insufficient documentation

## 2014-07-13 DIAGNOSIS — Z79899 Other long term (current) drug therapy: Secondary | ICD-10-CM | POA: Insufficient documentation

## 2014-07-13 DIAGNOSIS — Z8669 Personal history of other diseases of the nervous system and sense organs: Secondary | ICD-10-CM | POA: Insufficient documentation

## 2014-07-13 DIAGNOSIS — Z791 Long term (current) use of non-steroidal anti-inflammatories (NSAID): Secondary | ICD-10-CM | POA: Insufficient documentation

## 2014-07-13 DIAGNOSIS — Z3202 Encounter for pregnancy test, result negative: Secondary | ICD-10-CM | POA: Insufficient documentation

## 2014-07-13 DIAGNOSIS — Z87891 Personal history of nicotine dependence: Secondary | ICD-10-CM | POA: Insufficient documentation

## 2014-07-13 LAB — COMPREHENSIVE METABOLIC PANEL
ALT: 16 U/L (ref 0–35)
AST: 15 U/L (ref 0–37)
Albumin: 3.8 g/dL (ref 3.5–5.2)
Alkaline Phosphatase: 52 U/L (ref 39–117)
Anion gap: 6 (ref 5–15)
BUN: 15 mg/dL (ref 6–23)
CALCIUM: 8.7 mg/dL (ref 8.4–10.5)
CO2: 24 mmol/L (ref 19–32)
CREATININE: 0.73 mg/dL (ref 0.50–1.10)
Chloride: 105 mEq/L (ref 96–112)
GFR calc Af Amer: >90 mL/min (ref >90)
GFR calc non Af Amer: >90 mL/min (ref >90)
Glucose, Bld: 77 mg/dL (ref 70–99)
Potassium: 3.7 mmol/L (ref 3.5–5.1)
SODIUM: 135 mmol/L (ref 135–145)
TOTAL PROTEIN: 6.7 g/dL (ref 6.0–8.3)
Total Bilirubin: 0.9 mg/dL (ref 0.3–1.2)

## 2014-07-13 LAB — URINALYSIS, ROUTINE W REFLEX MICROSCOPIC
Bilirubin Urine: NEGATIVE
Glucose, UA: NEGATIVE mg/dL
HGB URINE DIPSTICK: NEGATIVE
Ketones, ur: NEGATIVE mg/dL
LEUKOCYTES UA: NEGATIVE
Nitrite: NEGATIVE
PROTEIN: NEGATIVE mg/dL
Specific Gravity, Urine: 1.027 (ref 1.005–1.030)
UROBILINOGEN UA: 1 mg/dL (ref 0.0–1.0)
pH: 6.5 (ref 5.0–8.0)

## 2014-07-13 LAB — CBC
HCT: 33.3 % — ABNORMAL LOW (ref 36.0–46.0)
Hemoglobin: 10.6 g/dL — ABNORMAL LOW (ref 12.0–15.0)
MCH: 23.9 pg — ABNORMAL LOW (ref 26.0–34.0)
MCHC: 31.8 g/dL (ref 30.0–36.0)
MCV: 75.2 fL — AB (ref 78.0–100.0)
PLATELETS: 297 10*3/uL (ref 150–400)
RBC: 4.43 MIL/uL (ref 3.87–5.11)
RDW: 15 % (ref 11.5–15.5)
WBC: 7 10*3/uL (ref 4.0–10.5)

## 2014-07-13 LAB — PREGNANCY, URINE: PREG TEST UR: NEGATIVE

## 2014-07-13 LAB — TROPONIN I: Troponin I: <0.03 ng/mL (ref <0.031)

## 2014-07-13 MED ORDER — SODIUM CHLORIDE 0.9 % IV SOLN
1000.0000 mL | INTRAVENOUS | Status: DC
  Administered 2014-07-13: 1000 mL via INTRAVENOUS

## 2014-07-13 MED ORDER — OMEPRAZOLE 20 MG PO CPDR: 20.0000 mg | DELAYED_RELEASE_CAPSULE | Freq: Every day | ORAL | Status: DC

## 2014-07-13 NOTE — ED Provider Notes (Signed)
CSN: [id number]     Arrival date & time 07/13/14  1636 History   First MD Initiated Contact with Patient 07/13/14 1714     Chief Complaint  Patient presents with  . Abdominal Pain   HPI  She developed discomfort in the epigastric area last night.  She felt a tightness in her chest.  She felt discomfort in her back as well.  That has been happening off and on today, a couple of hours at a time.  No nausea vomiting.  She has noticed some burning in her chest.  Later in the day she felt something fluttering and skipping in her chest.   [facility]  She was checked out but she wanted a second opinion.  They did not check her heart out. Family history of heart disease in mom.  Pt has had negative stress test in before.  No history of PE. Past Medical History  Diagnosis Date  . Anemia   . High cholesterol   . Anxiety   . Depression   . Insomnia   . Palpitation   . GERD (gastroesophageal reflux disease)    Past Surgical History  Procedure Laterality Date  . Breast reduction surgery    . Gallstone removal    . Wisdom tooth extraction     History reviewed. No pertinent family history. History  Substance Use Topics  . Smoking status: Former Games developermoker  . Smokeless tobacco: Not on file  . Alcohol Use: No   OB History    No data available     Review of Systems  Constitutional: Negative for fever.  Respiratory: Negative for cough.   Cardiovascular: Negative for chest pain.  Gastrointestinal: Negative for abdominal pain and abdominal distention.  Genitourinary: Positive for frequency.  Musculoskeletal: Negative for joint swelling.      Allergies  Aspirin  Home Medications   Prior to Admission medications   Medication Sig Start Date End Date Taking? Authorizing Provider  diphenhydrAMINE (SOMINEX) 25 MG tablet Take 25 mg by mouth at bedtime as needed for sleep.    Historical Provider, MD  IRON PO Take 1 tablet by mouth See admin  instructions. Take three days out of the week    Historical Provider, MD  naproxen sodium (ANAPROX) 220 MG tablet Take 220 mg by mouth 2 (two) times daily with a meal.    Historical Provider, MD  omeprazole (PRILOSEC) 20 MG capsule Take 1 capsule (20 mg total) by mouth daily. 07/13/14   Linwood DibblesJon Tarique Loveall, MD   BP 113/72 mmHg  Pulse 64  Temp(Src) 98.6 F (37 C) (Oral)  Resp 16  SpO2 100%  LMP 06/25/2014 Physical Exam  ED Course  Procedures (including critical care time) Labs Review Labs Reviewed  CBC - Abnormal; Notable for the following:    Hemoglobin 10.6 (*)    HCT 33.3 (*)    MCV 75.2 (*)    MCH 23.9 (*)    All other components within normal limits  URINALYSIS, ROUTINE W REFLEX MICROSCOPIC  PREGNANCY, URINE  COMPREHENSIVE METABOLIC PANEL  TROPONIN I    Imaging Review Dg Chest 2 View  07/13/2014   CLINICAL DATA:  Mid chest tightness. Palpitations for 1 day. Former smoker.  EXAM: CHEST  2 VIEW  COMPARISON:  05/13/2014  FINDINGS: Normal heart, mediastinum hila. Clear lungs. No pleural effusion or pneumothorax. Normal bony thorax.  IMPRESSION: Normal chest radiographs.   Electronically Signed   By: Amie Portlandavid  Ormond  M.D.   On: 07/13/2014 18:20     EKG Interpretation   Date/Time:  Wednesday July 13 2014 17:46:17 EST Ventricular Rate:  66 PR Interval:  162 QRS Duration: 88 QT Interval:  394 QTC Calculation: 413 R Axis:   64 Text Interpretation:  Normal sinus rhythm Normal ECG No significant change  since last tracing Confirmed by Saivion Goettel  MD-J, Emmalie Haigh ([id number]) on 07/13/2014  6:09:02 PM      MDM   Final diagnoses:  Gastroesophageal reflux disease without esophagitis    Patient has no abdominal tenderness on exam. She is low risk for PE. PERC negative.  I doubt acute coronary syndrome.  Symptoms may be related to GERD. Discharge home with Rx for Prilosec    Linwood Dibbles, MD 07/13/14 1905

## 2014-07-13 NOTE — Discharge Instructions (Signed)
Gastroesophageal Reflux Disease, Adult Gastroesophageal reflux disease (GERD) happens when acid from your stomach flows up into the esophagus. When acid comes in contact with the esophagus, the acid causes soreness (inflammation) in the esophagus. Over time, GERD may create small holes (ulcers) in the lining of the esophagus. CAUSES   Increased body weight. This puts pressure on the stomach, making acid rise from the stomach into the esophagus.  Smoking. This increases acid production in the stomach.  Drinking alcohol. This causes decreased pressure in the lower esophageal sphincter (valve or ring of muscle between the esophagus and stomach), allowing acid from the stomach into the esophagus.  Late evening meals and a full stomach. This increases pressure and acid production in the stomach.  A malformed lower esophageal sphincter. Sometimes, no cause is found. SYMPTOMS   Burning pain in the lower part of the mid-chest behind the breastbone and in the mid-stomach area. This may occur twice a week or more often.  Trouble swallowing.  Sore throat.  Dry cough.  Asthma-like symptoms including chest tightness, shortness of breath, or wheezing. DIAGNOSIS  Your caregiver may be able to diagnose GERD based on your symptoms. In some cases, X-rays and other tests may be done to check for complications or to check the condition of your stomach and esophagus. TREATMENT  Your caregiver may recommend over-the-counter or prescription medicines to help decrease acid production. Ask your caregiver before starting or adding any new medicines.  HOME CARE INSTRUCTIONS   Change the factors that you can control. Ask your caregiver for guidance concerning weight loss, quitting smoking, and alcohol consumption.  Avoid foods and drinks that make your symptoms worse, such as:  Caffeine or alcoholic drinks.  Chocolate.  Peppermint or mint flavorings.  Garlic and onions.  Spicy foods.  Citrus fruits,  such as oranges, lemons, or limes.  Tomato-based foods such as sauce, chili, salsa, and pizza.  Fried and fatty foods.  Avoid lying down for the 3 hours prior to your bedtime or prior to taking a nap.  Eat small, frequent meals instead of large meals.  Wear loose-fitting clothing. Do not wear anything tight around your waist that causes pressure on your stomach.  Raise the head of your bed 6 to 8 inches with wood blocks to help you sleep. Extra pillows will not help.  Only take over-the-counter or prescription medicines for pain, discomfort, or fever as directed by your caregiver.  Do not take aspirin, ibuprofen, or other nonsteroidal anti-inflammatory drugs (NSAIDs). SEEK IMMEDIATE MEDICAL CARE IF:   You have pain in your arms, neck, jaw, teeth, or back.  Your pain increases or changes in intensity or duration.  You develop nausea, vomiting, or sweating (diaphoresis).  You develop shortness of breath, or you faint.  Your vomit is green, yellow, black, or looks like coffee grounds or blood.  Your stool is red, bloody, or black. These symptoms could be signs of other problems, such as heart disease, gastric bleeding, or esophageal bleeding. MAKE SURE YOU:   Understand these instructions.  Will watch your condition.  Will get help right away if you are not doing well or get worse. Document Released: 03/20/2005 Document Revised: 09/02/2011 Document Reviewed: 12/28/2010 ExitCare Patient Information 2015 [employer]. This information is not intended to replace advice given to you by your health care provider. Make sure you discuss any questions you have with your health care provider.  

## 2014-07-13 NOTE — ED Notes (Addendum)
Pt c/o epigastric abd pain x 1 day denies n/v/d .  Pt also c/o urinary freq

## 2014-07-13 NOTE — ED Notes (Signed)
Patient transported to X-ray 

## 2014-08-18 ENCOUNTER — Encounter (HOSPITAL_BASED_OUTPATIENT_CLINIC_OR_DEPARTMENT_OTHER): Payer: Self-pay | Admitting: *Deleted

## 2014-08-18 ENCOUNTER — Emergency Department (HOSPITAL_BASED_OUTPATIENT_CLINIC_OR_DEPARTMENT_OTHER)
Admission: EM | Admit: 2014-08-18 | Discharge: 2014-08-18 | Disposition: A | Discharge status: Home / Self Care | Payer: Self-pay | Attending: Emergency Medicine | Admitting: Emergency Medicine

## 2014-08-18 DIAGNOSIS — R6883 Chills (without fever): Secondary | ICD-10-CM | POA: Insufficient documentation

## 2014-08-18 DIAGNOSIS — R112 Nausea with vomiting, unspecified: Secondary | ICD-10-CM | POA: Insufficient documentation

## 2014-08-18 DIAGNOSIS — Z3202 Encounter for pregnancy test, result negative: Secondary | ICD-10-CM | POA: Insufficient documentation

## 2014-08-18 DIAGNOSIS — R1013 Epigastric pain: Secondary | ICD-10-CM | POA: Insufficient documentation

## 2014-08-18 DIAGNOSIS — Z8639 Personal history of other endocrine, nutritional and metabolic disease: Secondary | ICD-10-CM | POA: Insufficient documentation

## 2014-08-18 DIAGNOSIS — Z79899 Other long term (current) drug therapy: Secondary | ICD-10-CM | POA: Insufficient documentation

## 2014-08-18 DIAGNOSIS — D649 Anemia, unspecified: Secondary | ICD-10-CM | POA: Insufficient documentation

## 2014-08-18 DIAGNOSIS — K219 Gastro-esophageal reflux disease without esophagitis: Secondary | ICD-10-CM | POA: Insufficient documentation

## 2014-08-18 DIAGNOSIS — Z8669 Personal history of other diseases of the nervous system and sense organs: Secondary | ICD-10-CM | POA: Insufficient documentation

## 2014-08-18 DIAGNOSIS — Z791 Long term (current) use of non-steroidal anti-inflammatories (NSAID): Secondary | ICD-10-CM | POA: Insufficient documentation

## 2014-08-18 LAB — PREGNANCY, URINE: PREG TEST UR: NEGATIVE

## 2014-08-18 LAB — COMPREHENSIVE METABOLIC PANEL
ALT: 59 U/L — ABNORMAL HIGH (ref 0–35)
ANION GAP: 2 — AB (ref 5–15)
AST: 25 U/L (ref 0–37)
Albumin: 4.1 g/dL (ref 3.5–5.2)
Alkaline Phosphatase: 85 U/L (ref 39–117)
BUN: 14 mg/dL (ref 6–23)
CALCIUM: 8.5 mg/dL (ref 8.4–10.5)
CO2: 28 mmol/L (ref 19–32)
CREATININE: 0.72 mg/dL (ref 0.50–1.10)
Chloride: 105 mmol/L (ref 96–112)
GFR calc Af Amer: >90 mL/min (ref >90)
GFR calc non Af Amer: >90 mL/min (ref >90)
Glucose, Bld: 85 mg/dL (ref 70–99)
Potassium: 3.6 mmol/L (ref 3.5–5.1)
Sodium: 135 mmol/L (ref 135–145)
TOTAL PROTEIN: 7 g/dL (ref 6.0–8.3)
Total Bilirubin: 0.6 mg/dL (ref 0.3–1.2)

## 2014-08-18 LAB — URINALYSIS, ROUTINE W REFLEX MICROSCOPIC
Bilirubin Urine: NEGATIVE
Glucose, UA: NEGATIVE mg/dL
Hgb urine dipstick: NEGATIVE
Ketones, ur: NEGATIVE mg/dL
Leukocytes, UA: NEGATIVE
NITRITE: NEGATIVE
Protein, ur: NEGATIVE mg/dL
Specific Gravity, Urine: 1.025 (ref 1.005–1.030)
UROBILINOGEN UA: 1 mg/dL (ref 0.0–1.0)
pH: 7 (ref 5.0–8.0)

## 2014-08-18 LAB — CBC
HCT: 34.3 % — ABNORMAL LOW (ref 36.0–46.0)
Hemoglobin: 11 g/dL — ABNORMAL LOW (ref 12.0–15.0)
MCH: 24.4 pg — ABNORMAL LOW (ref 26.0–34.0)
MCHC: 32.1 g/dL (ref 30.0–36.0)
MCV: 76.1 fL — ABNORMAL LOW (ref 78.0–100.0)
PLATELETS: 300 10*3/uL (ref 150–400)
RBC: 4.51 MIL/uL (ref 3.87–5.11)
RDW: 14.9 % (ref 11.5–15.5)
WBC: 6.1 10*3/uL (ref 4.0–10.5)

## 2014-08-18 LAB — LIPASE, BLOOD: Lipase: 43 U/L (ref 11–59)

## 2014-08-18 MED ORDER — ONDANSETRON HCL 4 MG/2ML IJ SOLN
4.0000 mg | Freq: Once | INTRAMUSCULAR | Status: AC
  Administered 2014-08-18: 4 mg via INTRAVENOUS
  Filled 2014-08-18: qty 2

## 2014-08-18 MED ORDER — PANTOPRAZOLE SODIUM 40 MG IV SOLR
40.0000 mg | Freq: Once | INTRAVENOUS | Status: AC
  Administered 2014-08-18: 40 mg via INTRAVENOUS
  Filled 2014-08-18: qty 40

## 2014-08-18 MED ORDER — ONDANSETRON 4 MG PO TBDP: ORAL_TABLET | ORAL | Status: DC

## 2014-08-18 MED ORDER — PANTOPRAZOLE SODIUM 20 MG PO TBEC: 20.0000 mg | DELAYED_RELEASE_TABLET | Freq: Every day | ORAL | Status: DC

## 2014-08-18 MED ORDER — SODIUM CHLORIDE 0.9 % IV BOLUS (SEPSIS)
1000.0000 mL | Freq: Once | INTRAVENOUS | Status: AC
  Administered 2014-08-18: 1000 mL via INTRAVENOUS

## 2014-08-18 NOTE — Discharge Instructions (Signed)
Take protonix as directed and zofran as needed for nausea. Follow up with your primary care doctor.  Nausea and Vomiting Nausea is a sick feeling that often comes before throwing up (vomiting). Vomiting is a reflex where stomach contents come out of your mouth. Vomiting can cause severe loss of body fluids (dehydration). Children and elderly adults can become dehydrated quickly, especially if they also have diarrhea. Nausea and vomiting are symptoms of a condition or disease. It is important to find the cause of your symptoms. CAUSES   Direct irritation of the stomach lining. This irritation can result from increased acid production (gastroesophageal reflux disease), infection, food poisoning, taking certain medicines (such as nonsteroidal anti-inflammatory drugs), alcohol use, or tobacco use.  Signals from the brain.These signals could be caused by a headache, heat exposure, an inner ear disturbance, increased pressure in the brain from injury, infection, a tumor, or a concussion, pain, emotional stimulus, or metabolic problems.  An obstruction in the gastrointestinal tract (bowel obstruction).  Illnesses such as diabetes, hepatitis, gallbladder problems, appendicitis, kidney problems, cancer, sepsis, atypical symptoms of a heart attack, or eating disorders.  Medical treatments such as chemotherapy and radiation.  Receiving medicine that makes you sleep (general anesthetic) during surgery. DIAGNOSIS Your caregiver may ask for tests to be done if the problems do not improve after a few days. Tests may also be done if symptoms are severe or if the reason for the nausea and vomiting is not clear. Tests may include:  Urine tests.  Blood tests.  Stool tests.  Cultures (to look for evidence of infection).  X-rays or other imaging studies. Test results can help your caregiver make decisions about treatment or the need for additional tests. TREATMENT You need to stay well hydrated. Drink  frequently but in small amounts.You may wish to drink water, sports drinks, clear broth, or eat frozen ice pops or gelatin dessert to help stay hydrated.When you eat, eating slowly may help prevent nausea.There are also some antinausea medicines that may help prevent nausea. HOME CARE INSTRUCTIONS   Take all medicine as directed by your caregiver.  If you do not have an appetite, do not force yourself to eat. However, you must continue to drink fluids.  If you have an appetite, eat a normal diet unless your caregiver tells you differently.  Eat a variety of complex carbohydrates (rice, wheat, potatoes, bread), lean meats, yogurt, fruits, and vegetables.  Avoid high-fat foods because they are more difficult to digest.  Drink enough water and fluids to keep your urine clear or pale yellow.  If you are dehydrated, ask your caregiver for specific rehydration instructions. Signs of dehydration may include:  Severe thirst.  Dry lips and mouth.  Dizziness.  Dark urine.  Decreasing urine frequency and amount.  Confusion.  Rapid breathing or pulse. SEEK IMMEDIATE MEDICAL CARE IF:   You have blood or brown flecks (like coffee grounds) in your vomit.  You have black or bloody stools.  You have a severe headache or stiff neck.  You are confused.  You have severe abdominal pain.  You have chest pain or trouble breathing.  You do not urinate at least once every 8 hours.  You develop cold or clammy skin.  You continue to vomit for longer than 24 to 48 hours.  You have a fever. MAKE SURE YOU:   Understand these instructions.  Will watch your condition.  Will get help right away if you are not doing well or get worse. Document Released:  06/10/2005 Document Revised: 09/02/2011 Document Reviewed: 11/07/2010 ExitCare Patient Information 2015 [employer] [place]. This information is not intended to replace advice given to you by your health care provider. Make sure you discuss  any questions you have with your health care provider. Gastritis, Adult Gastritis is soreness and swelling (inflammation) of the lining of the stomach. Gastritis can develop as a sudden onset (acute) or long-term (chronic) condition. If gastritis is not treated, it can lead to stomach bleeding and ulcers. CAUSES  Gastritis occurs when the stomach lining is weak or damaged. Digestive juices from the stomach then inflame the weakened stomach lining. The stomach lining may be weak or damaged due to viral or bacterial infections. One common bacterial infection is the Helicobacter pylori infection. Gastritis can also result from excessive alcohol consumption, taking certain medicines, or having too much acid in the stomach.  SYMPTOMS  In some cases, there are no symptoms. When symptoms are present, they may include:  Pain or a burning sensation in the upper abdomen.  Nausea.  Vomiting.  An uncomfortable feeling of fullness after eating. DIAGNOSIS  Your caregiver may suspect you have gastritis based on your symptoms and a physical exam. To determine the cause of your gastritis, your caregiver may perform the following:  Blood or stool tests to check for the H pylori bacterium.  Gastroscopy. A thin, flexible tube (endoscope) is passed down the esophagus and into the stomach. The endoscope has a light and camera on the end. Your caregiver uses the endoscope to view the inside of the stomach.  Taking a tissue sample (biopsy) from the stomach to examine under a microscope. TREATMENT  Depending on the cause of your gastritis, medicines may be prescribed. If you have a bacterial infection, such as an H pylori infection, antibiotics may be given. If your gastritis is caused by too much acid in the stomach, H2 blockers or antacids may be given. Your caregiver may recommend that you stop taking aspirin, ibuprofen, or other nonsteroidal anti-inflammatory drugs (NSAIDs). HOME CARE INSTRUCTIONS  Only take  over-the-counter or prescription medicines as directed by your caregiver.  If you were given antibiotic medicines, take them as directed. Finish them even if you start to feel better.  Drink enough fluids to keep your urine clear or pale yellow.  Avoid foods and drinks that make your symptoms worse, such as:  Caffeine or alcoholic drinks.  Chocolate.  Peppermint or mint flavorings.  Garlic and onions.  Spicy foods.  Citrus fruits, such as oranges, lemons, or limes.  Tomato-based foods such as sauce, chili, salsa, and pizza.  Fried and fatty foods.  Eat small, frequent meals instead of large meals. SEEK IMMEDIATE MEDICAL CARE IF:   You have black or dark red stools.  You vomit blood or material that looks like coffee grounds.  You are unable to keep fluids down.  Your abdominal pain gets worse.  You have a fever.  You do not feel better after 1 week.  You have any other questions or concerns. MAKE SURE YOU:  Understand these instructions.  Will watch your condition.  Will get help right away if you are not doing well or get worse. Document Released: 06/04/2001 Document Revised: 12/10/2011 Document Reviewed: 07/24/2011 [facility] Patient Information 2015 [employer], [place]. This information is not intended to replace advice given to you by your health care provider. Make sure you discuss any questions you have with your health care provider.

## 2014-08-18 NOTE — ED Provider Notes (Signed)
CSN: [id number]     Arrival date & time 08/18/14  1752 History   First MD Initiated Contact with Patient 08/18/14 1758     Chief Complaint  Patient presents with  . Emesis     (Consider location/radiation/quality/duration/timing/severity/associated sxs/prior Treatment) HPI Comments: 34 year old female presenting with nausea and vomiting 1 day. Patient reports one week ago she was seen at [facility] for nausea and vomiting, had blood work and an abdominal ultrasound and was told she was just dehydrated. After leaving the ED, she felt completely better, until this morning when she woke up she started to feel queasy. States the nausea is worse with certain smells or around certain foods. She had 2 episodes of nonbloody, nonbilious emesis earlier today, she was able to keep some crackers down around 3:00 PM. Admits to chills without fever. States her abdomen just feels "tender". She is currently on her menstrual cycle. Denies any urinary symptoms. She had an episode of diarrhea "a large episode of diarrhea" this morning. No sick contacts.  Patient is a 34 y.o. female presenting with vomiting. The history is provided by the patient.  Emesis Associated symptoms: abdominal pain and chills     Past Medical History  Diagnosis Date  . Anemia   . High cholesterol   . Anxiety   . Depression   . Insomnia   . Palpitation   . GERD (gastroesophageal reflux disease)    Past Surgical History  Procedure Laterality Date  . Breast reduction surgery    . Gallstone removal    . Wisdom tooth extraction     No family history on file. History  Substance Use Topics  . Smoking status: Former Games developer  . Smokeless tobacco: Not on file  . Alcohol Use: No   OB History    No data available     Review of Systems  Constitutional: Positive for chills.  Gastrointestinal: Positive for nausea, vomiting and abdominal pain.  All other systems reviewed and are negative.     Allergies   Aspirin  Home Medications   Prior to Admission medications   Medication Sig Start Date End Date Taking? Authorizing Provider  diphenhydrAMINE (SOMINEX) 25 MG tablet Take 25 mg by mouth at bedtime as needed for sleep.    Historical Provider, MD  IRON PO Take 1 tablet by mouth See admin instructions. Take three days out of the week    Historical Provider, MD  naproxen sodium (ANAPROX) 220 MG tablet Take 220 mg by mouth 2 (two) times daily with a meal.    Historical Provider, MD  omeprazole (PRILOSEC) 20 MG capsule Take 1 capsule (20 mg total) by mouth daily. 07/13/14   Linwood Dibbles, MD  ondansetron (ZOFRAN ODT) 4 MG disintegrating tablet  ODT q4 hours prn nausea/vomit 08/18/14   Deeandra Jerry M Sabriah Hobbins, PA-C  pantoprazole (PROTONIX) 20 MG tablet Take 1 tablet (20 mg total) by mouth daily. 08/18/14   Jalin Erpelding M Siaosi Alter, PA-C   BP 123/71 mmHg  Pulse 62  Temp(Src) 98.3 F (36.8 C) (Oral)  Resp 18  Ht  (1.651 m)  Wt 148 lb (67.132 kg)  BMI 24.63 kg/m2  SpO2 100%  LMP 08/15/2014 Physical Exam  Constitutional: She is oriented to person, place, and time. She appears well-developed and well-nourished. No distress.  HENT:  Head: Normocephalic and atraumatic.  Mouth/Throat: Oropharynx is clear and moist.  Eyes: Conjunctivae are normal.  Neck: Normal range of motion. Neck supple.  Cardiovascular: Normal rate, regular rhythm and  normal heart sounds.   Pulmonary/Chest: Effort normal and breath sounds normal.  Abdominal: Soft. Bowel sounds are normal. There is no rebound and no guarding.  Epigastric tenderness. No peritoneal signs.  Musculoskeletal: Normal range of motion. She exhibits no edema.  Neurological: She is alert and oriented to person, place, and time.  Skin: Skin is warm and dry. She is not diaphoretic.  Psychiatric: She has a normal mood and affect. Her behavior is normal.  Nursing note and vitals reviewed.   ED Course  Procedures (including critical care time) Labs Review Labs Reviewed   CBC - Abnormal; Notable for the following:    Hemoglobin 11.0 (*)    HCT 34.3 (*)    MCV 76.1 (*)    MCH 24.4 (*)    All other components within normal limits  COMPREHENSIVE METABOLIC PANEL - Abnormal; Notable for the following:    ALT 59 (*)    Anion gap 2 (*)    All other components within normal limits  URINALYSIS, ROUTINE W REFLEX MICROSCOPIC  PREGNANCY, URINE  LIPASE, BLOOD    Imaging Review No results found.   EKG Interpretation None      MDM   Final diagnoses:  Non-intractable vomiting with nausea, vomiting of unspecified type  Epigastric abdominal pain   Nontoxic appearing and in no apparent distress. AFVSS. Abdomen soft with epigastric tenderness, no peritoneal signs. Records obtained from [facility] ED visit, no acute findings. Negative abdominal ultrasound at that time. Labs here without any acute finding. Patient reports she is feeling completely better after receiving IV fluids, protonic since Zofran. Tolerating PO. Abdomen soft and non-tender on repeat exam. Stable for d/c. Will d/c with zofran and protonix. Return precautions given. Patient states understanding of treatment care plan and is agreeable.   Kathrynn SpeedRobyn M Audine Mangione, PA-C 08/18/14 1946  Ethelda ChickMartha K Linker, MD 08/18/14 [phone]

## 2014-08-18 NOTE — ED Notes (Signed)
Nausea for a week. Vomiting.

## 2014-08-26 ENCOUNTER — Emergency Department (HOSPITAL_BASED_OUTPATIENT_CLINIC_OR_DEPARTMENT_OTHER): Payer: Self-pay

## 2014-08-26 ENCOUNTER — Emergency Department (HOSPITAL_BASED_OUTPATIENT_CLINIC_OR_DEPARTMENT_OTHER)
Admission: EM | Admit: 2014-08-26 | Discharge: 2014-08-26 | Disposition: A | Discharge status: Home / Self Care | Payer: Self-pay | Attending: Emergency Medicine | Admitting: Emergency Medicine

## 2014-08-26 ENCOUNTER — Encounter (HOSPITAL_BASED_OUTPATIENT_CLINIC_OR_DEPARTMENT_OTHER): Payer: Self-pay | Admitting: *Deleted

## 2014-08-26 DIAGNOSIS — D649 Anemia, unspecified: Secondary | ICD-10-CM | POA: Insufficient documentation

## 2014-08-26 DIAGNOSIS — W2102XA Struck by soccer ball, initial encounter: Secondary | ICD-10-CM | POA: Insufficient documentation

## 2014-08-26 DIAGNOSIS — R0789 Other chest pain: Secondary | ICD-10-CM

## 2014-08-26 DIAGNOSIS — Z8639 Personal history of other endocrine, nutritional and metabolic disease: Secondary | ICD-10-CM | POA: Insufficient documentation

## 2014-08-26 DIAGNOSIS — Y998 Other external cause status: Secondary | ICD-10-CM | POA: Insufficient documentation

## 2014-08-26 DIAGNOSIS — Z3202 Encounter for pregnancy test, result negative: Secondary | ICD-10-CM | POA: Insufficient documentation

## 2014-08-26 DIAGNOSIS — Z791 Long term (current) use of non-steroidal anti-inflammatories (NSAID): Secondary | ICD-10-CM | POA: Insufficient documentation

## 2014-08-26 DIAGNOSIS — Z8669 Personal history of other diseases of the nervous system and sense organs: Secondary | ICD-10-CM | POA: Insufficient documentation

## 2014-08-26 DIAGNOSIS — Z720 Tobacco use: Secondary | ICD-10-CM | POA: Insufficient documentation

## 2014-08-26 DIAGNOSIS — Y92322 Soccer field as the place of occurrence of the external cause: Secondary | ICD-10-CM | POA: Insufficient documentation

## 2014-08-26 DIAGNOSIS — S29001A Unspecified injury of muscle and tendon of front wall of thorax, initial encounter: Secondary | ICD-10-CM | POA: Insufficient documentation

## 2014-08-26 DIAGNOSIS — Y9366 Activity, soccer: Secondary | ICD-10-CM | POA: Insufficient documentation

## 2014-08-26 DIAGNOSIS — Z79899 Other long term (current) drug therapy: Secondary | ICD-10-CM | POA: Insufficient documentation

## 2014-08-26 DIAGNOSIS — K219 Gastro-esophageal reflux disease without esophagitis: Secondary | ICD-10-CM | POA: Insufficient documentation

## 2014-08-26 DIAGNOSIS — Z8659 Personal history of other mental and behavioral disorders: Secondary | ICD-10-CM | POA: Insufficient documentation

## 2014-08-26 DIAGNOSIS — M940 Chondrocostal junction syndrome [Tietze]: Secondary | ICD-10-CM

## 2014-08-26 HISTORY — DX: Post-traumatic stress disorder, unspecified: F43.10

## 2014-08-26 LAB — URINALYSIS, ROUTINE W REFLEX MICROSCOPIC
Bilirubin Urine: NEGATIVE
Glucose, UA: NEGATIVE mg/dL
Hgb urine dipstick: NEGATIVE
Ketones, ur: NEGATIVE mg/dL
LEUKOCYTES UA: NEGATIVE
NITRITE: NEGATIVE
Protein, ur: NEGATIVE mg/dL
SPECIFIC GRAVITY, URINE: 1.027 (ref 1.005–1.030)
Urobilinogen, UA: 0.2 mg/dL (ref 0.0–1.0)
pH: 6.5 (ref 5.0–8.0)

## 2014-08-26 LAB — BASIC METABOLIC PANEL
ANION GAP: 0 — AB (ref 5–15)
BUN: 10 mg/dL (ref 6–23)
CO2: 28 mmol/L (ref 19–32)
Calcium: 8.5 mg/dL (ref 8.4–10.5)
Chloride: 105 mmol/L (ref 96–112)
Creatinine, Ser: 0.69 mg/dL (ref 0.50–1.10)
GFR calc Af Amer: >90 mL/min (ref >90)
Glucose, Bld: 88 mg/dL (ref 70–99)
POTASSIUM: 3.7 mmol/L (ref 3.5–5.1)
Sodium: 133 mmol/L — ABNORMAL LOW (ref 135–145)

## 2014-08-26 LAB — CBC WITH DIFFERENTIAL/PLATELET
BASOS ABS: 0 10*3/uL (ref 0.0–0.1)
BASOS PCT: 0 % (ref 0–1)
Eosinophils Absolute: 0.1 10*3/uL (ref 0.0–0.7)
Eosinophils Relative: 1 % (ref 0–5)
HCT: 35.1 % — ABNORMAL LOW (ref 36.0–46.0)
HEMOGLOBIN: 11.4 g/dL — AB (ref 12.0–15.0)
LYMPHS PCT: 38 % (ref 12–46)
Lymphs Abs: 2.6 10*3/uL (ref 0.7–4.0)
MCH: 24.4 pg — ABNORMAL LOW (ref 26.0–34.0)
MCHC: 32.5 g/dL (ref 30.0–36.0)
MCV: 75.2 fL — ABNORMAL LOW (ref 78.0–100.0)
MONOS PCT: 12 % (ref 3–12)
Monocytes Absolute: 0.8 10*3/uL (ref 0.1–1.0)
NEUTROS ABS: 3.3 10*3/uL (ref 1.7–7.7)
NEUTROS PCT: 49 % (ref 43–77)
Platelets: 275 10*3/uL (ref 150–400)
RBC: 4.67 MIL/uL (ref 3.87–5.11)
RDW: 14.6 % (ref 11.5–15.5)
WBC: 6.9 10*3/uL (ref 4.0–10.5)

## 2014-08-26 LAB — TROPONIN I

## 2014-08-26 LAB — PREGNANCY, URINE: Preg Test, Ur: NEGATIVE

## 2014-08-26 MED ORDER — ACETAMINOPHEN 500 MG PO TABS
1000.0000 mg | ORAL_TABLET | Freq: Once | ORAL | Status: AC
  Administered 2014-08-26: 1000 mg via ORAL
  Filled 2014-08-26: qty 2

## 2014-08-26 NOTE — ED Notes (Signed)
Pt texting on cell phone, refuses to put her phone away for initial triage, states "I have got to finish texting with my daughter first!"

## 2014-08-26 NOTE — ED Notes (Signed)
Pt states has had cough, chest tightness worse w inspiration dn palpation chills, nausea, fatigue, onset this am  Worse after getting hit in chest w soccer ball this pm

## 2014-08-26 NOTE — ED Provider Notes (Signed)
CSN: [id number]     Arrival date & time 08/26/14  1844 History   First MD Initiated Contact with Patient 08/26/14 1943     Chief Complaint  Patient presents with  . hit in chest with soccer ball      (Consider location/radiation/quality/duration/timing/severity/associated sxs/prior Treatment) HPI Patricia Wilcox is a 34 year old female with past medical history of anemia, anxiety, depression, insomnia, GERD who presents the ER complaining of chest discomfort since this morning. Patient states having gradual onset of midsternal chest discomfort this morning, which has been intermittent throughout the day. Patient states the pain is worse with inspiration, worse with coughing. Patient reports having a mild, nonproductive cough for the past several days. Patient states this evening she was hit in the chest with a soccer ball, and noticed her pain was worse after this episode. Patient denies shortness of breath, sore throat, palpitations, nausea, vomiting, abdominal pain, headache, blurred vision, dizziness.  Past Medical History  Diagnosis Date  . Anemia   . High cholesterol   . Anxiety   . Depression   . Insomnia   . Palpitation   . GERD (gastroesophageal reflux disease)   . PTSD (post-traumatic stress disorder)   . Anxiety    Past Surgical History  Procedure Laterality Date  . Breast reduction surgery    . Gallstone removal    . Wisdom tooth extraction     History reviewed. No pertinent family history. History  Substance Use Topics  . Smoking status: Current Every Day Smoker  . Smokeless tobacco: Not on file  . Alcohol Use: No   OB History    No data available     Review of Systems  Constitutional: Negative for fever.  HENT: Negative for trouble swallowing.   Eyes: Negative for visual disturbance.  Respiratory: Negative for shortness of breath.   Cardiovascular: Negative for chest pain.  Gastrointestinal: Negative for nausea, vomiting and abdominal pain.  Genitourinary:  Negative for dysuria.  Musculoskeletal: Negative for neck pain.       Chest wall pain  Skin: Negative for rash.  Neurological: Negative for dizziness, weakness and numbness.  Psychiatric/Behavioral: Negative.       Allergies  Aspirin  Home Medications   Prior to Admission medications   Medication Sig Start Date End Date Taking? Authorizing Provider  diphenhydrAMINE (SOMINEX) 25 MG tablet Take 25 mg by mouth at bedtime as needed for sleep.    Historical Provider, MD  IRON PO Take 1 tablet by mouth See admin instructions. Take three days out of the week    Historical Provider, MD  naproxen sodium (ANAPROX) 220 MG tablet Take 220 mg by mouth 2 (two) times daily with a meal.    Historical Provider, MD  omeprazole (PRILOSEC) 20 MG capsule Take 1 capsule (20 mg total) by mouth daily. 07/13/14   Linwood DibblesJon Knapp, MD  ondansetron (ZOFRAN ODT) 4 MG disintegrating tablet 4mg  ODT q4 hours prn nausea/vomit 08/18/14   Robyn M Hess, PA-C  pantoprazole (PROTONIX) 20 MG tablet Take 1 tablet (20 mg total) by mouth daily. 08/18/14   Robyn M Hess, PA-C   BP 131/75 mmHg  Pulse 62  Resp 16  Ht 5\' 5"  (1.651 m)  Wt 148 lb (67.132 kg)  BMI 24.63 kg/m2  SpO2 100%  LMP 08/15/2014 Physical Exam  Constitutional: She is oriented to person, place, and time. She appears well-developed and well-nourished. No distress.  HENT:  Head: Normocephalic and atraumatic.  Mouth/Throat: Oropharynx is clear and moist. No oropharyngeal exudate.  Eyes: Right eye exhibits no discharge. Left eye exhibits no discharge. No scleral icterus.  Neck: Normal range of motion.  Cardiovascular: Normal rate, regular rhythm and normal heart sounds.   No murmur heard. Pulmonary/Chest: Effort normal and breath sounds normal. No accessory muscle usage. No tachypnea. No respiratory distress.    Abdominal: Soft. There is no tenderness.  Musculoskeletal: Normal range of motion. She exhibits no edema or tenderness.  Neurological: She is alert and  oriented to person, place, and time. No cranial nerve deficit. Coordination normal.  Skin: Skin is warm and dry. No rash noted. She is not diaphoretic.  Psychiatric: She has a normal mood and affect.  Nursing note and vitals reviewed.   ED Course  Procedures (including critical care time) Labs Review Labs Reviewed  CBC WITH DIFFERENTIAL/PLATELET - Abnormal; Notable for the following:    Hemoglobin 11.4 (*)    HCT 35.1 (*)    MCV 75.2 (*)    MCH 24.4 (*)    All other components within normal limits  BASIC METABOLIC PANEL - Abnormal; Notable for the following:    Sodium 133 (*)    Anion gap 0 (*)    All other components within normal limits  TROPONIN I  URINALYSIS, ROUTINE W REFLEX MICROSCOPIC  PREGNANCY, URINE    Imaging Review Dg Chest 2 View  08/26/2014   CLINICAL DATA:  Acute onset of cough and chest tightness, worse with inspiration. Palpitations, chills, nausea and fatigue. Initial encounter.  EXAM: CHEST  2 VIEW  COMPARISON:  Chest radiograph performed 07/13/2014  FINDINGS: The lungs are well-aerated and clear. There is no evidence of focal opacification, pleural effusion or pneumothorax.  The heart is normal in size; the mediastinal contour is within normal limits. No acute osseous abnormalities are seen.  IMPRESSION: No acute cardiopulmonary process seen.   Electronically Signed   By: Roanna Raider M.D.   On: 08/26/2014 20:41     EKG Interpretation   Date/Time:  Friday August 26 2014 19:00:12 EST Ventricular Rate:  71 PR Interval:  174 QRS Duration: 88 QT Interval:  374 QTC Calculation: 406 R Axis:   65 Text Interpretation:  Normal sinus rhythm Normal ECG since last tracing no  significant change Confirmed by BELFI  MD, MELANIE ([id number]) on 08/26/2014  8:22:39 PM      MDM   Final diagnoses:  Costochondritis  Chest wall pain    Chest pain is not likely of cardiac or pulmonary etiology d/t presentation, perc negative.  Throughout ER stay, patient is afebrile,  non-tachycardic, nontachypneic, non-hypoxic, well-appearing and in no acute distress. No tracheal deviation, no JVD or new murmur, RRR, breath sounds equal bilaterally, EKG without acute abnormalities, negative troponin, and negative CXR. Patient's pain pleuritic in nature, no concern for ACS, no concern for pneumonia, no concern for PE. Likely chest wall pain, may possibly be costochondritis in the setting of recent viral illness. Pt has been advised return to the ED is CP becomes exertional, associated with diaphoresis or nausea, radiates to left jaw/arm, worsens or becomes concerning in any way.  I discussed return precautions with patient, and strongly encouraged patient to follow with her primary care provider. Patient verbalizes understanding and agreement this plan. I encouraged patient to call or to the ER for any worsening of symptoms or should she have any questions or concerns.  BP 131/75 mmHg  Pulse 62  Resp 16  Ht  (1.651 m)  Wt 148 lb (67.132 kg)  BMI 24.63 kg/m2  SpO2 100%  LMP 08/15/2014  Signed,  Ladona Mow, PA-C 10:22 PM  Patient discussed with Dr. Rolan Bucco, MD.        Monte Fantasia, PA-C 08/26/14 [id number]  Rolan Bucco, MD 08/27/14 [phone]

## 2014-08-26 NOTE — ED Notes (Signed)
Pt reports being hit in mid chest with soccer ball at niece's soccer game at 1630. Pt states "it still hurts really bad." mid chest is tender to touch. ekg performed while pt being triaged.

## 2014-08-26 NOTE — Discharge Instructions (Signed)
Chest Wall Pain °Chest wall pain is pain in or around the bones and muscles of your chest. It may take up to 6 weeks to get better. It may take longer if you must stay physically active in your work and activities.  °CAUSES  °Chest wall pain may happen on its own. However, it may be caused by: °· A viral illness like the flu. °· Injury. °· Coughing. °· Exercise. °· Arthritis. °· Fibromyalgia. °· Shingles. °HOME CARE INSTRUCTIONS  °· Avoid overtiring physical activity. Try not to strain or perform activities that cause pain. This includes any activities using your chest or your abdominal and side muscles, especially if heavy weights are used. °· Put ice on the sore area. °¨ Put ice in a plastic bag. °¨ Place a towel between your skin and the bag. °¨ Leave the ice on for 15-20 minutes per hour while awake for the first 2 days. °· Only take over-the-counter or prescription medicines for pain, discomfort, or fever as directed by your caregiver. °SEEK IMMEDIATE MEDICAL CARE IF:  °· Your pain increases, or you are very uncomfortable. °· You have a fever. °· Your chest pain becomes worse. °· You have new, unexplained symptoms. °· You have nausea or vomiting. °· You feel sweaty or lightheaded. °· You have a cough with phlegm (sputum), or you cough up blood. °MAKE SURE YOU:  °· Understand these instructions. °· Will watch your condition. °· Will get help right away if you are not doing well or get worse. °Document Released: 06/10/2005 Document Revised: 09/02/2011 Document Reviewed: 02/04/2011 °ExitCare® Patient Information ©2015 [employer]. This information is not intended to replace advice given to you by your health care provider. Make sure you discuss any questions you have with your health care provider. ° °Costochondritis °Costochondritis, sometimes called Tietze syndrome, is a swelling and irritation (inflammation) of the tissue (cartilage) that connects your ribs with your breastbone (sternum). It causes pain in  the chest and rib area. Costochondritis usually goes away on its own over time. It can take up to 6 weeks or longer to get better, especially if you are unable to limit your activities. °CAUSES  °Some cases of costochondritis have no known cause. Possible causes include: °· Injury (trauma). °· Exercise or activity such as lifting. °· Severe coughing. °SIGNS AND SYMPTOMS °· Pain and tenderness in the chest and rib area. °· Pain that gets worse when coughing or taking deep breaths. °· Pain that gets worse with specific movements. °DIAGNOSIS  °Your health care provider will do a physical exam and ask about your symptoms. Chest X-rays or other tests may be done to rule out other problems. °TREATMENT  °Costochondritis usually goes away on its own over time. Your health care provider may prescribe medicine to help relieve pain. °HOME CARE INSTRUCTIONS  °· Avoid exhausting physical activity. Try not to strain your ribs during normal activity. This would include any activities using chest, abdominal, and side muscles, especially if heavy weights are used. °· Apply ice to the affected area for the first 2 days after the pain begins. °¨ Put ice in a plastic bag. °¨ Place a towel between your skin and the bag. °¨ Leave the ice on for 20 minutes, 2-3 times a day. °· Only take over-the-counter or prescription medicines as directed by your health care provider. °SEEK MEDICAL CARE IF: °· You have redness or swelling at the rib joints. These are signs of infection. °· Your pain does not go away despite rest   or medicine. °SEEK IMMEDIATE MEDICAL CARE IF:  °· Your pain increases or you are very uncomfortable. °· You have shortness of breath or difficulty breathing. °· You cough up blood. °· You have worse chest pains, sweating, or vomiting. °· You have a fever or persistent symptoms for more than 2-3 days. °· You have a fever and your symptoms suddenly get worse. °MAKE SURE YOU:  °· Understand these instructions. °· Will watch your  condition. °· Will get help right away if you are not doing well or get worse. °Document Released: 03/20/2005 Document Revised: 03/31/2013 Document Reviewed: 01/12/2013 °ExitCare® Patient Information ©2015 [employer]. This information is not intended to replace advice given to you by your health care provider. Make sure you discuss any questions you have with your health care provider. ° °

## 2014-12-13 ENCOUNTER — Encounter (HOSPITAL_BASED_OUTPATIENT_CLINIC_OR_DEPARTMENT_OTHER): Payer: Self-pay | Admitting: Emergency Medicine

## 2014-12-13 ENCOUNTER — Emergency Department (HOSPITAL_BASED_OUTPATIENT_CLINIC_OR_DEPARTMENT_OTHER)
Admission: EM | Admit: 2014-12-13 | Discharge: 2014-12-13 | Disposition: A | Discharge status: Home / Self Care | Payer: Self-pay | Attending: Emergency Medicine | Admitting: Emergency Medicine

## 2014-12-13 DIAGNOSIS — G4700 Insomnia, unspecified: Secondary | ICD-10-CM | POA: Insufficient documentation

## 2014-12-13 DIAGNOSIS — Z0000 Encounter for general adult medical examination without abnormal findings: Secondary | ICD-10-CM | POA: Insufficient documentation

## 2014-12-13 DIAGNOSIS — D649 Anemia, unspecified: Secondary | ICD-10-CM | POA: Insufficient documentation

## 2014-12-13 DIAGNOSIS — Z79899 Other long term (current) drug therapy: Secondary | ICD-10-CM | POA: Insufficient documentation

## 2014-12-13 DIAGNOSIS — Z720 Tobacco use: Secondary | ICD-10-CM | POA: Insufficient documentation

## 2014-12-13 DIAGNOSIS — K219 Gastro-esophageal reflux disease without esophagitis: Secondary | ICD-10-CM | POA: Insufficient documentation

## 2014-12-13 DIAGNOSIS — Z8659 Personal history of other mental and behavioral disorders: Secondary | ICD-10-CM | POA: Insufficient documentation

## 2014-12-13 NOTE — Discharge Instructions (Signed)
If you feel chest pain, shortness of breath, lightheaded or dizzy with exercise, then stop and be evaluated by physician.

## 2014-12-13 NOTE — ED Notes (Signed)
Pt in after reported episode of tachycardia during exercise on treadmill. Pt reported to rate monitor read 192 and her trainer was concerned and had her stop and come in for evaluation. Pt denies feeling palpitations, heart pounding, nausea, chest pain, SOB, or any abnormal sensations during episode. Pulse was not taken manually at the gyn to confirm.

## 2014-12-13 NOTE — ED Notes (Signed)
Teaching done with Pt. On self pulse checks and normal heart rates.  Teaching done with Pt. On cardiac care and exercise.

## 2014-12-13 NOTE — ED Provider Notes (Signed)
CSN: [id number]     Arrival date & time 12/13/14  1651 History   First MD Initiated Contact with Patient 12/13/14 1712     Chief Complaint  Patient presents with  . Tachycardia     HPI  She presents for evaluation asymptomatic. She was working out at [employer]. She was on the treadmill. She states that one of the "trainers" came by and noted that her heart rate on the treadmill machine was 192. She states that she felt fine. She states that he told her that this was "way too high". He first asked her to drink some water. Then she states that he  "talked to his Louann Sjogren" and that he told her "you better go to the doctor".  She denies palpitations dyspnea shortness of breath, chest pain, or any limitation of her exertional capacity. She's been going the gym and doing cardio for the last 3 months.  Past Medical History  Diagnosis Date  . Anemia   . High cholesterol   . Anxiety   . Depression   . Insomnia   . Palpitation   . GERD (gastroesophageal reflux disease)   . PTSD (post-traumatic stress disorder)   . Anxiety    Past Surgical History  Procedure Laterality Date  . Breast reduction surgery    . Gallstone removal    . Wisdom tooth extraction     History reviewed. No pertinent family history. History  Substance Use Topics  . Smoking status: Current Every Day Smoker  . Smokeless tobacco: Not on file  . Alcohol Use: No   OB History    No data available     Review of Systems  Constitutional: Negative for fever, chills, diaphoresis, appetite change and fatigue.  HENT: Negative for mouth sores, sore throat and trouble swallowing.   Eyes: Negative for visual disturbance.  Respiratory: Negative for cough, chest tightness, shortness of breath and wheezing.   Cardiovascular: Negative for chest pain.  Gastrointestinal: Negative for nausea, vomiting, abdominal pain, diarrhea and abdominal distention.  Endocrine: Negative for polydipsia, polyphagia and polyuria.   Genitourinary: Negative for dysuria, frequency and hematuria.  Musculoskeletal: Negative for gait problem.  Skin: Negative for color change, pallor and rash.  Neurological: Negative for dizziness, syncope, light-headedness and headaches.  Hematological: Does not bruise/bleed easily.  Psychiatric/Behavioral: Negative for behavioral problems and confusion.      Allergies  Aspirin  Home Medications   Prior to Admission medications   Medication Sig Start Date End Date Taking? Authorizing Provider  diphenhydrAMINE (SOMINEX) 25 MG tablet Take 25 mg by mouth at bedtime as needed for sleep.    Historical Provider, MD  IRON PO Take 1 tablet by mouth See admin instructions. Take three days out of the week    Historical Provider, MD  naproxen sodium (ANAPROX) 220 MG tablet Take 220 mg by mouth 2 (two) times daily with a meal.    Historical Provider, MD  omeprazole (PRILOSEC) 20 MG capsule Take 1 capsule (20 mg total) by mouth daily. 07/13/14   Linwood Dibbles, MD  ondansetron (ZOFRAN ODT) 4 MG disintegrating tablet  ODT q4 hours prn nausea/vomit 08/18/14   Robyn M Hess, PA-C  pantoprazole (PROTONIX) 20 MG tablet Take 1 tablet (20 mg total) by mouth daily. 08/18/14   Robyn M Hess, PA-C   BP 122/75 mmHg  Pulse 76  Temp(Src) 98.1 F (36.7 C) (Oral)  Resp 18  SpO2 100%  LMP 11/29/2014 Physical Exam  Constitutional: She is oriented to person,  place, and time. She appears well-developed and well-nourished. No distress.  HENT:  Head: Normocephalic.  Eyes: Conjunctivae are normal. Pupils are equal, round, and reactive to light. No scleral icterus.  Neck: Normal range of motion. Neck supple. No thyromegaly present.  Cardiovascular: Normal rate and regular rhythm.  Exam reveals no gallop and no friction rub.   No murmur heard. Pulmonary/Chest: Effort normal and breath sounds normal. No respiratory distress. She has no wheezes. She has no rales.  Abdominal: Soft. Bowel sounds are normal. She exhibits  no distension. There is no tenderness. There is no rebound.  Musculoskeletal: Normal range of motion.  Neurological: She is alert and oriented to person, place, and time.  Skin: Skin is warm and dry. No rash noted.  Psychiatric: She has a normal mood and affect. Her behavior is normal.    ED Course  Procedures (including critical care time) Labs Review Labs Reviewed - No data to display  Imaging Review No results found.   EKG Interpretation   Date/Time:  Tuesday December 13 2014 17:03:46 EDT Ventricular Rate:  82 PR Interval:  166 QRS Duration: 88 QT Interval:  376 QTC Calculation: 439 R Axis:   75 Text Interpretation:  Normal sinus rhythm Normal ECG Confirmed by Fayrene Fearing   MD, Foye Damron ([id number]) on 12/13/2014 5:46:07 PM      MDM   Final diagnoses:  Normal physical exam    She remains asymptomatic here. She is ambulatory around the department with no appreciable rise in her pulse. She was essentially asymptomatic during her exercise other than "what I normally feel". With only 1-2 minutes of rest she was asymptomatic and really not symptomatic at any point. Denies chest pain lightheadedness or palpitations. I do not think she needs studies or testing in the emergency room. She has a controlled heart rate and a normal resting EKG.    Rolland Porter, MD 12/13/14 [phone]

## 2015-03-23 ENCOUNTER — Emergency Department (HOSPITAL_BASED_OUTPATIENT_CLINIC_OR_DEPARTMENT_OTHER): Payer: Self-pay

## 2015-03-23 ENCOUNTER — Emergency Department (HOSPITAL_BASED_OUTPATIENT_CLINIC_OR_DEPARTMENT_OTHER)
Admission: EM | Admit: 2015-03-23 | Discharge: 2015-03-23 | Disposition: A | Discharge status: Home / Self Care | Payer: Self-pay | Attending: Emergency Medicine | Admitting: Emergency Medicine

## 2015-03-23 ENCOUNTER — Encounter (HOSPITAL_BASED_OUTPATIENT_CLINIC_OR_DEPARTMENT_OTHER): Payer: Self-pay | Admitting: Emergency Medicine

## 2015-03-23 DIAGNOSIS — Z8669 Personal history of other diseases of the nervous system and sense organs: Secondary | ICD-10-CM | POA: Insufficient documentation

## 2015-03-23 DIAGNOSIS — Z8719 Personal history of other diseases of the digestive system: Secondary | ICD-10-CM | POA: Insufficient documentation

## 2015-03-23 DIAGNOSIS — F419 Anxiety disorder, unspecified: Secondary | ICD-10-CM | POA: Insufficient documentation

## 2015-03-23 DIAGNOSIS — Z8639 Personal history of other endocrine, nutritional and metabolic disease: Secondary | ICD-10-CM | POA: Insufficient documentation

## 2015-03-23 DIAGNOSIS — Z8659 Personal history of other mental and behavioral disorders: Secondary | ICD-10-CM | POA: Insufficient documentation

## 2015-03-23 DIAGNOSIS — R0602 Shortness of breath: Secondary | ICD-10-CM | POA: Insufficient documentation

## 2015-03-23 DIAGNOSIS — Z862 Personal history of diseases of the blood and blood-forming organs and certain disorders involving the immune mechanism: Secondary | ICD-10-CM | POA: Insufficient documentation

## 2015-03-23 DIAGNOSIS — R079 Chest pain, unspecified: Secondary | ICD-10-CM | POA: Insufficient documentation

## 2015-03-23 DIAGNOSIS — Z720 Tobacco use: Secondary | ICD-10-CM | POA: Insufficient documentation

## 2015-03-23 LAB — CBC
HCT: 35 % — ABNORMAL LOW (ref 36.0–46.0)
HEMOGLOBIN: 11.4 g/dL — AB (ref 12.0–15.0)
MCH: 24.3 pg — ABNORMAL LOW (ref 26.0–34.0)
MCHC: 32.6 g/dL (ref 30.0–36.0)
MCV: 74.5 fL — ABNORMAL LOW (ref 78.0–100.0)
Platelets: 284 10*3/uL (ref 150–400)
RBC: 4.7 MIL/uL (ref 3.87–5.11)
RDW: 15.2 % (ref 11.5–15.5)
WBC: 5.2 10*3/uL (ref 4.0–10.5)

## 2015-03-23 LAB — BASIC METABOLIC PANEL
Anion gap: 6 (ref 5–15)
BUN: 12 mg/dL (ref 6–20)
CO2: 27 mmol/L (ref 22–32)
CREATININE: 0.81 mg/dL (ref 0.44–1.00)
Calcium: 9.2 mg/dL (ref 8.9–10.3)
Chloride: 106 mmol/L (ref 101–111)
GFR calc non Af Amer: >60 mL/min (ref >60)
Glucose, Bld: 84 mg/dL (ref 65–99)
POTASSIUM: 3.9 mmol/L (ref 3.5–5.1)
SODIUM: 139 mmol/L (ref 135–145)

## 2015-03-23 LAB — TROPONIN I

## 2015-03-23 NOTE — ED Notes (Addendum)
Pt seen and evaluated at [facility] yesterday per care everywhere, pt was astonished that we knew she at another ER day before, however per care everywhere chart pt left AMA  Pt in nad in triage

## 2015-03-23 NOTE — ED Provider Notes (Signed)
CSN: [id number]     Arrival date & time 03/23/15  1507 History   First MD Initiated Contact with Patient 03/23/15 1513     Chief Complaint  Patient presents with  . Chest Pain     (Consider location/radiation/quality/duration/timing/severity/associated sxs/prior Treatment) HPI Comments: Patient with h/o anxiety, negative exercise treadmill stress test 11/14 -- presents with complaint of mid chest dullness and shortness of breath experienced today while exercising. Patient states that when symptoms occurred she was very anxious. She slowed to a walk and symptoms improved however returned when she tried to run again. She states that it felt like she cannot get a deep breath in. She did not have palpitations or syncopize. No nausea or vomiting. She denies fever, cough. No other treatments prior to arrival. Cardiac risk factors include: Smoking, history of MI in mother at age 82, cholesterol. Patient denies risk factors for pulmonary embolism including: unilateral leg swelling, history of DVT/PE/other blood clots, use of estrogens, recent immobilizations, recent surgery, recent travel (>4hr segment), malignancy, hemoptysis. The onset of this condition was acute. The course is resolved. Aggravating factors: none. Alleviating factors: none.    The history is provided by the patient and medical records.    Past Medical History  Diagnosis Date  . Anemia   . High cholesterol   . Anxiety   . Depression   . Insomnia   . Palpitation   . GERD (gastroesophageal reflux disease)   . PTSD (post-traumatic stress disorder)   . Anxiety    Past Surgical History  Procedure Laterality Date  . Breast reduction surgery    . Gallstone removal    . Wisdom tooth extraction     History reviewed. No pertinent family history. Social History  Substance Use Topics  . Smoking status: Current Every Day Smoker -- 0.50 packs/day    Types: Cigarettes  . Smokeless tobacco: None  . Alcohol Use: No   OB History     No data available     Review of Systems  Constitutional: Negative for fever and diaphoresis.  Eyes: Negative for redness.  Respiratory: Positive for shortness of breath. Negative for cough.   Cardiovascular: Positive for chest pain. Negative for palpitations and leg swelling.  Gastrointestinal: Negative for nausea, vomiting and abdominal pain.  Genitourinary: Negative for dysuria.  Musculoskeletal: Negative for back pain and neck pain.  Skin: Negative for rash.  Neurological: Negative for syncope and light-headedness.  Psychiatric/Behavioral: The patient is nervous/anxious.     Allergies  Aspirin  Home Medications   Prior to Admission medications   Medication Sig Start Date End Date Taking? Authorizing Provider  acetaminophen-codeine (TYLENOL #3) 300-30 MG tablet Take by mouth every 4 (four) hours as needed for moderate pain.   Yes Historical Provider, MD   BP 116/79 mmHg  Pulse 71  Temp(Src) 98.1 F (36.7 C) (Oral)  Resp 20  Ht  (1.651 m)  Wt 140 lb (63.504 kg)  BMI 23.30 kg/m2  SpO2 100%  LMP 03/16/2015   Physical Exam  Constitutional: She appears well-developed and well-nourished.  HENT:  Head: Normocephalic and atraumatic.  Eyes: Conjunctivae are normal. Right eye exhibits no discharge. Left eye exhibits no discharge.  Neck: Normal range of motion. Neck supple.  Cardiovascular: Normal rate, regular rhythm and normal heart sounds.   Pulmonary/Chest: Effort normal and breath sounds normal.  Abdominal: Soft. There is no tenderness.  Neurological: She is alert.  Skin: Skin is warm and dry.  Psychiatric: She has a normal mood  and affect.  Nursing note and vitals reviewed.   ED Course  Procedures (including critical care time) Labs Review Labs Reviewed  CBC - Abnormal; Notable for the following:    Hemoglobin 11.4 (*)    HCT 35.0 (*)    MCV 74.5 (*)    MCH 24.3 (*)    All other components within normal limits  BASIC METABOLIC PANEL  TROPONIN I     Imaging Review Dg Chest 2 View  03/23/2015   CLINICAL DATA:  Midportion chest pain and shortness of breath  EXAM: CHEST  2 VIEW  COMPARISON:  March 14, 2015  FINDINGS: Lungs are clear. Heart size and pulmonary vascularity are normal. No pneumothorax. No adenopathy. No bone lesions.  IMPRESSION: No abnormality noted.   Electronically Signed   By: Bretta Bang III M.D.   On: 03/23/2015 16:41   I have personally reviewed and evaluated these images and lab results as part of my medical decision-making.   3:20 PM Patient seen and examined. EKG reviewed and is normal. No change since 6/16. Work-up initiated. Medications ordered.   Vital signs reviewed and are as follows: BP 116/79 mmHg  Pulse 71  Temp(Src) 98.1 F (36.7 C) (Oral)  Resp 20  Ht  (1.651 m)  Wt 140 lb (63.504 kg)  BMI 23.30 kg/m2  SpO2 100%  LMP 03/16/2015  ED ECG REPORT   Date: 03/23/2015  Rate: 69  Rhythm: normal sinus rhythm  QRS Axis: normal  Intervals: normal  ST/T Wave abnormalities: normal  Conduction Disutrbances:none  Narrative Interpretation:   Old EKG Reviewed: unchanged  I have personally reviewed the EKG tracing and agree with the computerized printout as noted.  5:21 PM patient and friend at bedside updated on results. Results are reassuring and normal. Discussed that ideally we would check another troponin in one hour to ensure that this is not becoming elevated. Patient states that she cannot wait that she needs to go home care for her children.  Patient strongly encouraged to follow-up with her PCP or cardiologist (referral given) for further evaluation. She should not return to exercise until cleared. She should cease any activity which causes recurrent chest pain or shortness of breath. Patient encouraged to return to the emergency department with any worsening or additional symptoms.  Patient was counseled to return with severe chest pain, especially if the pain is crushing or  pressure-like and spreads to the arms, back, neck, or jaw, or if they have sweating, nausea, or shortness of breath with the pain. They were encouraged to call 911 with these symptoms.   They were also told to return if their chest pain gets worse and does not go away with rest, they have an attack of chest pain lasting longer than usual despite rest and treatment with the medications their caregiver has prescribed, if they wake from sleep with chest pain or shortness of breath, if they feel dizzy or faint, if they have chest pain not typical of their usual pain, or if they have any other emergent concerns regarding their health.  The patient verbalized understanding and agreed.      MDM   Final diagnoses:  Chest pain, unspecified chest pain type   Patient with HEART = 1, PERC neg. CP/SOB with exercise today only. Previous neg stress echo. She has a lot of anxiety 2/2 family history. EKG is neg, trop neg x 1 she will not stay for 2nd. Low suspicion for ACS. Follow-up as above.  Renne Crigler, PA-C 03/23/15 1726  Rolan Bucco, MD 03/24/15 [phone]

## 2015-03-23 NOTE — Discharge Instructions (Signed)
Please read and follow all provided instructions.  Your diagnoses today include:  1. Chest pain, unspecified chest pain type     Tests performed today include:  An EKG of your heart  A chest x-ray  Cardiac enzymes - a blood test for heart muscle damage  Blood counts and electrolytes  Vital signs. See below for your results today.   Medications prescribed:   None  Take any prescribed medications only as directed.  Follow-up instructions: Please follow-up with your primary care provider or the heart doctor group listed as soon as you can for further evaluation of your symptoms.   Return instructions:  SEEK IMMEDIATE MEDICAL ATTENTION IF:  You have severe chest pain, especially if the pain is crushing or pressure-like and spreads to the arms, back, neck, or jaw, or if you have sweating, nausea (feeling sick to your stomach), or shortness of breath. THIS IS AN EMERGENCY. Don't wait to see if the pain will go away. Get medical help at once. Call 911 or [phone] (operator). DO NOT drive yourself to the hospital.   Your chest pain gets worse and does not go away with rest.   You have an attack of chest pain lasting longer than usual, despite rest and treatment with the medications your caregiver has prescribed.   You wake from sleep with chest pain or shortness of breath.  You feel dizzy or faint.  You have chest pain not typical of your usual pain for which you originally saw your caregiver.   You have any other emergent concerns regarding your health.  Additional Information: Chest pain comes from many different causes. Your caregiver has diagnosed you as having chest pain that is not specific for one problem, but does not require admission.  You are at low risk for an acute heart condition or other serious illness.   Do not return to full exercise until you are cleared by your doctor or cardiologist. Stop activity immediately if you experience return of chest pain or shortness of  breath.   Your vital signs today were: BP 116/79 mmHg   Pulse 71   Temp(Src) 98.1 F (36.7 C) (Oral)   Resp 20   Ht  (1.651 m)   Wt 140 lb (63.504 kg)   BMI 23.30 kg/m2   SpO2 100%   LMP 03/16/2015 If your blood pressure (BP) was elevated above 135/85 this visit, please have this repeated by your doctor within one month. --------------

## 2015-03-23 NOTE — ED Notes (Signed)
Lungs clear x 2 and pulse ox 100%. No resp distress noted.

## 2015-03-23 NOTE — ED Notes (Signed)
Pt reports that she has been working out for 4 1/2 monthes, today she developed soreness in center of sternum with dizziness and sob, pt was drinking and eating food in triage in nad

## 2015-03-26 ENCOUNTER — Encounter (HOSPITAL_BASED_OUTPATIENT_CLINIC_OR_DEPARTMENT_OTHER): Payer: Self-pay | Admitting: *Deleted

## 2015-03-26 ENCOUNTER — Emergency Department (HOSPITAL_BASED_OUTPATIENT_CLINIC_OR_DEPARTMENT_OTHER)
Admission: EM | Admit: 2015-03-26 | Discharge: 2015-03-26 | Disposition: A | Discharge status: Home / Self Care | Payer: Self-pay | Attending: Physician Assistant | Admitting: Physician Assistant

## 2015-03-26 ENCOUNTER — Emergency Department (HOSPITAL_BASED_OUTPATIENT_CLINIC_OR_DEPARTMENT_OTHER): Payer: Self-pay

## 2015-03-26 DIAGNOSIS — R51 Headache: Secondary | ICD-10-CM | POA: Insufficient documentation

## 2015-03-26 DIAGNOSIS — R519 Headache, unspecified: Secondary | ICD-10-CM

## 2015-03-26 DIAGNOSIS — Z720 Tobacco use: Secondary | ICD-10-CM | POA: Insufficient documentation

## 2015-03-26 DIAGNOSIS — Z8659 Personal history of other mental and behavioral disorders: Secondary | ICD-10-CM | POA: Insufficient documentation

## 2015-03-26 DIAGNOSIS — Z8669 Personal history of other diseases of the nervous system and sense organs: Secondary | ICD-10-CM | POA: Insufficient documentation

## 2015-03-26 DIAGNOSIS — Z8639 Personal history of other endocrine, nutritional and metabolic disease: Secondary | ICD-10-CM | POA: Insufficient documentation

## 2015-03-26 DIAGNOSIS — Z862 Personal history of diseases of the blood and blood-forming organs and certain disorders involving the immune mechanism: Secondary | ICD-10-CM | POA: Insufficient documentation

## 2015-03-26 LAB — BASIC METABOLIC PANEL
ANION GAP: 4 — AB (ref 5–15)
BUN: 12 mg/dL (ref 6–20)
CALCIUM: 8.9 mg/dL (ref 8.9–10.3)
CO2: 29 mmol/L (ref 22–32)
Chloride: 107 mmol/L (ref 101–111)
Creatinine, Ser: 0.84 mg/dL (ref 0.44–1.00)
GFR calc non Af Amer: >60 mL/min (ref >60)
GLUCOSE: 83 mg/dL (ref 65–99)
Potassium: 4.1 mmol/L (ref 3.5–5.1)
SODIUM: 140 mmol/L (ref 135–145)

## 2015-03-26 LAB — CBC WITH DIFFERENTIAL/PLATELET
BASOS ABS: 0 10*3/uL (ref 0.0–0.1)
BASOS PCT: 0 %
EOS ABS: 0.1 10*3/uL (ref 0.0–0.7)
EOS PCT: 3 %
HCT: 32.1 % — ABNORMAL LOW (ref 36.0–46.0)
Hemoglobin: 10.3 g/dL — ABNORMAL LOW (ref 12.0–15.0)
Lymphocytes Relative: 48 %
Lymphs Abs: 2.7 10*3/uL (ref 0.7–4.0)
MCH: 24.2 pg — ABNORMAL LOW (ref 26.0–34.0)
MCHC: 32.1 g/dL (ref 30.0–36.0)
MCV: 75.5 fL — AB (ref 78.0–100.0)
Monocytes Absolute: 0.5 10*3/uL (ref 0.1–1.0)
Monocytes Relative: 9 %
Neutro Abs: 2.2 10*3/uL (ref 1.7–7.7)
Neutrophils Relative %: 40 %
PLATELETS: 255 10*3/uL (ref 150–400)
RBC: 4.25 MIL/uL (ref 3.87–5.11)
RDW: 15.4 % (ref 11.5–15.5)
WBC: 5.5 10*3/uL (ref 4.0–10.5)

## 2015-03-26 MED ORDER — PROCHLORPERAZINE EDISYLATE 5 MG/ML IJ SOLN
10.0000 mg | Freq: Once | INTRAMUSCULAR | Status: AC
  Administered 2015-03-26: 10 mg via INTRAMUSCULAR
  Filled 2015-03-26: qty 2

## 2015-03-26 MED ORDER — KETOROLAC TROMETHAMINE 30 MG/ML IJ SOLN: 15.0000 mg | Freq: Once | INTRAMUSCULAR | Status: DC

## 2015-03-26 MED ORDER — DIPHENHYDRAMINE HCL 50 MG/ML IJ SOLN
25.0000 mg | Freq: Once | INTRAMUSCULAR | Status: AC
  Administered 2015-03-26: 25 mg via INTRAMUSCULAR
  Filled 2015-03-26: qty 1

## 2015-03-26 MED ORDER — DIPHENHYDRAMINE HCL 50 MG/ML IJ SOLN: 25.0000 mg | Freq: Once | INTRAMUSCULAR | Status: DC

## 2015-03-26 MED ORDER — PROCHLORPERAZINE EDISYLATE 5 MG/ML IJ SOLN: 10.0000 mg | Freq: Once | INTRAMUSCULAR | Status: DC

## 2015-03-26 MED ORDER — KETOROLAC TROMETHAMINE 60 MG/2ML IM SOLN
30.0000 mg | Freq: Once | INTRAMUSCULAR | Status: AC
  Administered 2015-03-26: 30 mg via INTRAMUSCULAR
  Filled 2015-03-26: qty 2

## 2015-03-26 NOTE — Discharge Instructions (Signed)
You may continue taking Ibuprofen as prescribed for pain relief.  Please follow up with a primary care provider from the Resource Guide provided below in 3-4 days. Please return to the Emergency Department if symptoms worsen or new onset of fever, neck stiffness, visual changes, abdominal pain, nausea, vomiting, urinary symptoms, numbness, tingling, weakness, seizures, syncope.     Emergency Department Resource Guide 1) Find a Doctor and Pay Out of Pocket Although you won't have to find out who is covered by your insurance plan, it is a good idea to ask around and get recommendations. You will then need to call the office and see if the doctor you have chosen will accept you as a new patient and what types of options they offer for patients who are self-pay. Some doctors offer discounts or will set up payment plans for their patients who do not have insurance, but you will need to ask so you aren't surprised when you get to your appointment.  2) Contact Your Local Health Department Not all health departments have doctors that can see patients for sick visits, but many do, so it is worth a call to see if yours does. If you don't know where your local health department is, you can check in your phone book. The CDC also has a tool to help you locate your state's health department, and many state websites also have listings of all of their local health departments.  3) Find a Walk-in Clinic If your illness is not likely to be very severe or complicated, you may want to try a walk in clinic. These are popping up all over the country in pharmacies, drugstores, and shopping centers. They're usually staffed by nurse practitioners or physician assistants that have been trained to treat common illnesses and complaints. They're usually fairly quick and inexpensive. However, if you have serious medical issues or chronic medical problems, these are probably not your best option.  No Primary Care Doctor: - Call  Health Connect at  [phone] - they can help you locate a primary care doctor that  accepts your insurance, provides certain services, etc. - Physician Referral Service- [phone]  Chronic Pain Problems: Organization         Address  Phone   Notes  [facility]  [phone] Patients need to be referred by their primary care doctor.   Medication Assistance: Organization         Address  Phone   Notes  [facility] Medication [facility] [address] [place] [place], [place] [address] [phone] --Must be a resident of [facility] -- Must have NO insurance coverage whatsoever (no Medicaid/ Medicare, etc.) -- The pt. MUST have a primary care doctor that directs their care regularly and follows them in the community   [facility]  [phone]   [employer]  [phone]    Agencies that provide inexpensive medical care: Organization         Address  Phone   Notes  [facility]  [phone]   [facility]    [phone]   [facility] [address] [place], [place] [address] [phone]   [facility] [address] [place] [place] [phone]   Planned Parenthood    [phone]   [facility]    [phone]   [facility]  [address], [place] Phone:  [phone],  Fax:  [phone] Hours of Operation:  9 am - 6 pm, M-F.  Also accepts Medicaid/Medicare and self-pay.  [facility]  [address], [place] Phone: [phone], Fax: [phone]. Hours of Operation:  8:30 am - 5:30 pm, M-F.  Also accepts Medicaid and self-pay.  [facility] [place] [address] [place] Phone: [phone]   [facility] [address] Natasha Bence [place], [place] [phone], Ext. [phone] Mondays & Thursdays: 7-9 AM.  First 15 patients are seen on a first come, first serve  basis.    Medicaid-accepting [facility] Providers:  Organization         Address  Phone   Notes  [facility] [address], [place] [phone] Also accepts self-pay patients.  [facility] [address] Laurell Josephs [place], [place]  [phone]   [facility] [address], [place] [phone]   [facility] [address], [place] [phone]   Renaye Rakers [address], [place]   [phone] Only accepts [place] Access [place] patients after they have their name applied to their card.   Self-Pay (no insurance) in [facility]:  Organization         Address  Phone   Notes  Sickle Cell Patients, [facility] [address] [place], [place] [phone]   [facility] [address] [place], [place] [phone]   [facility]  [address], [place] [phone]   [facility]/Dr. Osei-Bonsu  [address] [place] or [address], [place] [phone] Phone number for both [employer] and [place] locations is the same.  [facility] [address] [place] [phone]   [facility] [address] [place] or [address] [phone]   [facility] [address] [place] [phone], phone; [phone], fax Sees patients 1st and 3rd Saturday of every month.  Must not qualify for public or private insurance (i.e. Medicaid, Medicare, [place] Health Choice, Veterans' Benefits)  Household income should be no more than 200% of the poverty level The clinic cannot treat you if you are pregnant or think you are pregnant  Sexually transmitted diseases are not treated at the clinic.    Dental Care: Organization         Address  Phone  Notes  [facility] Department of [facility] [address] [place], [place] [phone] Accepts children up to age 36 who are enrolled in [place] or [place] Health Choice; pregnant women with a Medicaid card; and children who have applied for Medicaid or [place] Health Choice, but were declined, whose parents can pay a reduced fee at time of service.  [facility] of [facility]  [address], [place] [phone] Accepts children up to age 16 who are enrolled in [place] or  Health Choice; pregnant women with a Medicaid card; and children who have applied for Medicaid or  Health Choice, but were declined, whose parents can pay a reduced fee at time of service.  [facility]  [address] [place], [place] [phone] Patients are seen by appointment only. Walk-ins are not  accepted. [place] will see patients 36 years of age and older. Monday - Tuesday (8am-5pm) Most Wednesdays (8:30-5pm) $30 per visit, cash only  [facility]  [address], [facility] [phone] Patients are seen by appointment only. Walk-ins are not accepted. [place] will see patients 36 years of age and older. One Wednesday Evening (Monthly: Volunteer Based).  $30 per visit, cash only  [place]  [phone] for adults; Children under age 81, call Graduate Pediatric Dentistry at [phone]. Children aged 62-14, please call [phone] to request a pediatric application.  Dental services are provided in all areas of dental care including fillings, crowns and bridges, complete and partial dentures, implants, gum treatment, root canals, and extractions. Preventive care is also provided. Treatment is provided to both adults and children. Patients are selected via a lottery and there is often a waiting list.   [facility] [address] [place]  [phone]  [email or web address]   [facility] [address] [place], [place] [phone], Ext. [phone] Second and Fourth Thursday of each month, opens at 6:30 AM; Clinic ends at 9 AM.  Patients are seen on a first-come first-served basis, and a limited number are seen during each clinic.   [facility]  [address] Hillard Danker [place], [place] [phone]   Eligibility Requirements You must have lived in [place], [place], or [place] counties for at least the last three months.   You cannot be eligible for state or federal sponsored [employer], including [employer], [place], or [employer].   You generally cannot be eligible for healthcare insurance through your employer.    How to apply: Eligibility screenings are held every Tuesday and Wednesday afternoon from 1:00 pm until 4:00 pm. You do not need an appointment for the interview!  [facility] [address] [place], [place]   [place]  [place] Department  [place]  [phone]    Behavioral Health Resources in the Community: Intensive Outpatient Programs Organization         Address  Phone  Notes  [facility] [place]. [address] [place], [place] [phone]   [facility] Outpatient [address], [place], [place]   [facility]: [facility] [address], [place], [place]   [place] [address]  [place], [place] or [phone]   Substance Abuse Resources Organization         Address  Phone  Notes  [facility] Drug Services  [phone]   [place]  [phone]   [facility] [place]   Chinita Pester  [phone]   Residential & Outpatient Substance Abuse Program  [phone]   Psychological Services Organization          Address  Phone  Notes  [facility] [place]  [place]  [phone]   [place] [address], [place] or [phone]    Mobile Crisis Teams Organization         Address  Phone  Notes  [facility], Mobile Crisis Care Unit  [phone]   [facility]  [address] [place], [place]   [place] [address], [place] [phone]    Self-Help/Support Groups  Organization         Address  Phone             Notes  [facility] [place] - variety of support groups  [phone]- [phone] Call for more information  Narcotics Anonymous (NA), [facility] [address], [employer] [place]  2 meetings at this location   Statistician         Address  Phone  Notes  [facility] [address],    [place]  [phone]   [facility]  [address] [place] [address], [place], [place] [phone]   [facility] [address] [place] [address] [place], [place] [phone] Admissions: 8am-3pm M-F  [facility] [address],    [place], [place] [phone]   [facility] [address] [place], [place], [place] [phone]   [facility] [place] [facility] [address]  [place], [place] [phone]   [facility] - Intensive Outpatient [address] Laurell Josephs [address], [place], [place] [phone]   [facility] ([facility] [address] [place]  [place], [place] [phone] or [phone]   [facility] ([facility]) [address], [place], [place] [phone] Accepts Medicaid  [facility] [address],  [place] [phone] Substance Abuse/Addiction Treatment   [facility] Organization         Address  Phone  Notes  [facility]  [phone]   Angie Fava, PhD [address] Ervin Knack [place], [place]   [phone] or [phone]   [facility]   [address] [place], [place] [phone]   [facility] [address] [place], [place] [phone] Insurance/Medicaid/sponsorship through [facility] and Families [address]                                    [place], [place] [phone] Therapy/tele-psych/case  [facility] [address] [place], [place] [phone]    Dr. Lolly Mustache  [phone]   [facility]  [employer] [facility] Dept. 1) [address], [place] 2) [address] [place] 3)  [address], [place] [phone]  [phone]   [facility] Child Abuse Hotline [phone] or [phone] (After Hours)

## 2015-03-26 NOTE — ED Provider Notes (Signed)
CSN: [id number]     Arrival date & time 03/26/15  1157 History   First MD Initiated Contact with Patient 03/26/15 1427     Chief Complaint  Patient presents with  . Headache     (Consider location/radiation/quality/duration/timing/severity/associated sxs/prior Treatment) HPI Comments: Pt is a 34 yo female who presents to the ED with complaint of HA, onset yesterday morning. Pt reports having right sided HA that she describes as "feeling like inflammation" and also reports having a "draining" sensation and intermittent sharp pain. Denies any aggravating or alleviating factors. Pt denies fever, neck stiffness, visual changes, photophobia, abdominal pain, N/V, urinary symptoms, numbness, tingling, weakness, seizures, syncope.  She reports having hx of migraines but notes that this HA is different from he typical migraines. She states she has been taking Excedrin at home without relief.    Past Medical History  Diagnosis Date  . Anemia   . High cholesterol   . Anxiety   . Depression   . Insomnia   . Palpitation   . GERD (gastroesophageal reflux disease)   . PTSD (post-traumatic stress disorder)   . Anxiety    Past Surgical History  Procedure Laterality Date  . Breast reduction surgery    . Gallstone removal    . Wisdom tooth extraction     No family history on file. Social History  Substance Use Topics  . Smoking status: Current Every Day Smoker -- 0.50 packs/day    Types: Cigarettes  . Smokeless tobacco: Never Used  . Alcohol Use: No   OB History    No data available     Review of Systems  Neurological: Positive for headaches.  All other systems reviewed and are negative.     Allergies  Aspirin  Home Medications   Prior to Admission medications   Medication Sig Start Date End Date Taking? Authorizing Provider  diphenhydramine-acetaminophen (TYLENOL PM) 25-500 MG TABS tablet Take 1 tablet by mouth at bedtime as needed.   Yes Historical Provider, MD   acetaminophen-codeine (TYLENOL #3) 300-30 MG tablet Take by mouth every 4 (four) hours as needed for moderate pain.    Historical Provider, MD   BP 130/88 mmHg  Pulse 68  Temp(Src) 98.3 F (36.8 C) (Oral)  Resp 18  Ht  (1.651 m)  Wt 145 lb (65.772 kg)  BMI 24.13 kg/m2  SpO2 100%  LMP 03/16/2015 Physical Exam  Constitutional: She is oriented to person, place, and time. She appears well-developed and well-nourished. She appears distressed.  HENT:  Head: Normocephalic and atraumatic. Head is without abrasion, without contusion and without laceration. Hair is normal.  Right Ear: Tympanic membrane normal.  Left Ear: Tympanic membrane normal.  Nose: Nose normal. Right sinus exhibits no maxillary sinus tenderness and no frontal sinus tenderness. Left sinus exhibits no maxillary sinus tenderness and no frontal sinus tenderness.  Mouth/Throat: Uvula is midline, oropharynx is clear and moist and mucous membranes are normal. No oropharyngeal exudate.  Eyes: Conjunctivae and EOM are normal. Pupils are equal, round, and reactive to light. Right eye exhibits no discharge. Left eye exhibits no discharge. No scleral icterus.  Fundoscopic exam:      The right eye shows no AV nicking, no exudate, no hemorrhage and no papilledema.       The left eye shows no AV nicking, no exudate, no hemorrhage and no papilledema.  Neck: Normal range of motion. Neck supple.  Cardiovascular: Normal rate, regular rhythm, normal heart sounds and intact distal pulses.   No  murmur heard. Pulmonary/Chest: Effort normal and breath sounds normal. No respiratory distress. She has no wheezes. She has no rales. She exhibits no tenderness.  Abdominal: Soft. Bowel sounds are normal. She exhibits no distension and no mass. There is no tenderness. There is no rebound and no guarding.  Musculoskeletal: Normal range of motion. She exhibits no edema or tenderness.  Lymphadenopathy:    She has no cervical adenopathy.   Neurological: She is alert and oriented to person, place, and time. She has normal strength and normal reflexes. No cranial nerve deficit or sensory deficit. She displays a negative Romberg sign. Coordination and gait normal.  Skin: Skin is warm and dry. No rash noted. She is not diaphoretic.  Nursing note and vitals reviewed.   ED Course  Procedures (including critical care time) Labs Review Labs Reviewed  CBC WITH DIFFERENTIAL/PLATELET - Abnormal; Notable for the following:    Hemoglobin 10.3 (*)    HCT 32.1 (*)    MCV 75.5 (*)    MCH 24.2 (*)    All other components within normal limits  BASIC METABOLIC PANEL - Abnormal; Notable for the following:    Anion gap 4 (*)    All other components within normal limits    Imaging Review No results found. I have personally reviewed and evaluated these images and lab results as part of my medical decision-making.  Filed Vitals:   03/26/15 1717  BP: 133/86  Pulse: 52  Temp:   Resp: 16   Meds given in ED:  Medications  prochlorperazine (COMPAZINE) injection 10 mg (10 mg Intramuscular Given 03/26/15 1650)  diphenhydrAMINE (BENADRYL) injection 25 mg (25 mg Intramuscular Given 03/26/15 1651)  ketorolac (TORADOL) injection 30 mg (30 mg Intramuscular Given 03/26/15 1651)    Discharge Medication List as of 03/26/2015  6:01 PM       MDM   Final diagnoses:  Nonintractable headache, unspecified chronicity pattern, unspecified headache type    Pt presents to the ED with complaint of HA. States she has migraines but this HA is different than her typical migraines. Denies any other associated sxs. VSS. Exam unremarkable, no neuro deficits, pt able to stand and ambulate in room without difficulty. Pt given migraine cocktail in ED. Labs unremarkable.   I do not suspect CVA, intracranial hemorrhage, intracranial lesion, meningitis, encephalopathy or encephalitis and do not think that cranial imaging is needed at this time. I discussed this  with pt, however the pt was adamant about having imaging done. I discussed potential  side effects associated with radiation exposure however she still was requesting imaging. CT head w/o contrast ordered. Ct negative. Plan to d/c pt home. Advised pt to continue taking the Ibuprofen  she has at home and to f/u with her PCP.  Evaluation does not show pathology requring ongoing emergent intervention or admission. Pt is hemodynamically stable and mentating appropriately. Discussed findings/results and plan with patient/guardian, who agrees with plan. All questions answered. Return precautions discussed and outpatient follow up given.    Satira Sark [place], [place] 03/27/15 1143  Courteney Randall An, MD 03/30/15 1116

## 2015-03-26 NOTE — ED Notes (Signed)
Pt reports right side headache since yesterday morning. Reports "woke me from sleep"

## 2015-07-26 ENCOUNTER — Encounter (HOSPITAL_BASED_OUTPATIENT_CLINIC_OR_DEPARTMENT_OTHER): Payer: Self-pay

## 2015-07-26 ENCOUNTER — Emergency Department (HOSPITAL_BASED_OUTPATIENT_CLINIC_OR_DEPARTMENT_OTHER)
Admission: EM | Admit: 2015-07-26 | Discharge: 2015-07-26 | Disposition: A | Discharge status: Home / Self Care | Payer: Self-pay | Attending: Emergency Medicine | Admitting: Emergency Medicine

## 2015-07-26 DIAGNOSIS — Z862 Personal history of diseases of the blood and blood-forming organs and certain disorders involving the immune mechanism: Secondary | ICD-10-CM | POA: Insufficient documentation

## 2015-07-26 DIAGNOSIS — B9689 Other specified bacterial agents as the cause of diseases classified elsewhere: Secondary | ICD-10-CM

## 2015-07-26 DIAGNOSIS — Z8639 Personal history of other endocrine, nutritional and metabolic disease: Secondary | ICD-10-CM | POA: Insufficient documentation

## 2015-07-26 DIAGNOSIS — F17210 Nicotine dependence, cigarettes, uncomplicated: Secondary | ICD-10-CM | POA: Insufficient documentation

## 2015-07-26 DIAGNOSIS — Z8719 Personal history of other diseases of the digestive system: Secondary | ICD-10-CM | POA: Insufficient documentation

## 2015-07-26 DIAGNOSIS — G4700 Insomnia, unspecified: Secondary | ICD-10-CM | POA: Insufficient documentation

## 2015-07-26 DIAGNOSIS — Z8659 Personal history of other mental and behavioral disorders: Secondary | ICD-10-CM | POA: Insufficient documentation

## 2015-07-26 DIAGNOSIS — Z3202 Encounter for pregnancy test, result negative: Secondary | ICD-10-CM | POA: Insufficient documentation

## 2015-07-26 DIAGNOSIS — N898 Other specified noninflammatory disorders of vagina: Secondary | ICD-10-CM

## 2015-07-26 DIAGNOSIS — N760 Acute vaginitis: Secondary | ICD-10-CM | POA: Insufficient documentation

## 2015-07-26 LAB — URINALYSIS, ROUTINE W REFLEX MICROSCOPIC
Bilirubin Urine: NEGATIVE
Glucose, UA: NEGATIVE mg/dL
Hgb urine dipstick: NEGATIVE
Ketones, ur: NEGATIVE mg/dL
Leukocytes, UA: NEGATIVE
Nitrite: NEGATIVE
Protein, ur: NEGATIVE mg/dL
Specific Gravity, Urine: 1.024 (ref 1.005–1.030)
pH: 6.5 (ref 5.0–8.0)

## 2015-07-26 LAB — WET PREP, GENITAL
Sperm: NONE SEEN
Trich, Wet Prep: NONE SEEN
Yeast Wet Prep HPF POC: NONE SEEN

## 2015-07-26 LAB — PREGNANCY, URINE: Preg Test, Ur: NEGATIVE

## 2015-07-26 MED ORDER — TINIDAZOLE 500 MG PO TABS: 1000.0000 mg | ORAL_TABLET | Freq: Every day | ORAL | Status: DC

## 2015-07-26 NOTE — ED Notes (Signed)
Pelvic pain x 5 days-self treated yeast infection with OTC meds x 2 days

## 2015-07-26 NOTE — ED Notes (Signed)
Pa  at bedside. 

## 2015-07-26 NOTE — Discharge Instructions (Signed)

## 2015-07-26 NOTE — ED Provider Notes (Signed)
CSN: [id number]     Arrival date & time 07/26/15  1509 History   None    Chief Complaint  Patient presents with  . Pelvic Pain    HPI   35 year old female presents today with complaints of vaginal discharge and pelvic pain. She reports symptoms started approximately 5 days ago with a nurse vaginal discharge, has been using over-the-counter yeast infection medication for the last 2 days. She reports suprapubic pain, intermittent, sharp in nature, with resolution in between episodes. She denies any fever, chills, nausea, vomiting, upper abdominal pain, vaginal bleeding, changes in bowel or bladder habits. Patient reports that symptoms are improved after urination. Patient has not tried any over-the-counter medications prior to arrival.  Past Medical History  Diagnosis Date  . Anemia   . High cholesterol   . Anxiety   . Depression   . Insomnia   . Palpitation   . GERD (gastroesophageal reflux disease)   . PTSD (post-traumatic stress disorder)   . Anxiety    Past Surgical History  Procedure Laterality Date  . Breast reduction surgery    . Gallstone removal    . Wisdom tooth extraction     No family history on file. Social History  Substance Use Topics  . Smoking status: Current Every Day Smoker -- 0.50 packs/day    Types: Cigarettes  . Smokeless tobacco: Never Used  . Alcohol Use: No   OB History    No data available     Review of Systems  All other systems reviewed and are negative.     Allergies  Aspirin  Home Medications   Prior to Admission medications   Medication Sig Start Date End Date Taking? Authorizing Provider  diphenhydramine-acetaminophen (TYLENOL PM) 25-500 MG TABS tablet Take 1 tablet by mouth at bedtime as needed.    Historical Provider, MD  tinidazole (TINDAMAX) 500 MG tablet Take 2 tablets (1,000 mg total) by mouth daily with breakfast. 07/26/15   Eyvonne Mechanic, PA-C   BP 111/73 mmHg  Pulse 72  Temp(Src) 98.4 F (36.9 C) (Oral)  Resp 16  Wt  67.949 kg  SpO2 100%  LMP 07/14/2015   Physical Exam  Constitutional: She is oriented to person, place, and time. She appears well-developed and well-nourished.  HENT:  Head: Normocephalic and atraumatic.  Eyes: Conjunctivae are normal. Pupils are equal, round, and reactive to light. Right eye exhibits no discharge. Left eye exhibits no discharge. No scleral icterus.  Neck: Normal range of motion. No JVD present. No tracheal deviation present.  Pulmonary/Chest: Effort normal. No stridor.  Abdominal: She exhibits no distension and no mass. There is tenderness. There is no rebound and no guarding. Hernia confirmed negative in the right inguinal area and confirmed negative in the left inguinal area.  Very minor tenderness to palpation of the suprapubic region  Genitourinary: Uterus normal. Cervix exhibits no motion tenderness, no discharge and no friability. Right adnexum displays no mass, no tenderness and no fullness. Left adnexum displays no mass, no tenderness and no fullness. No erythema, tenderness or bleeding in the vagina. No foreign body around the vagina. No signs of injury around the vagina. Vaginal discharge found.  White discharge  Lymphadenopathy:       Right: No inguinal adenopathy present.       Left: No inguinal adenopathy present.  Neurological: She is alert and oriented to person, place, and time. Coordination normal.  Psychiatric: She has a normal mood and affect. Her behavior is normal. Judgment and thought content  normal.  Nursing note and vitals reviewed.   ED Course  Procedures (including critical care time) Labs Review Labs Reviewed  WET PREP, GENITAL - Abnormal; Notable for the following:    Clue Cells Wet Prep HPF POC PRESENT (*)    WBC, Wet Prep HPF POC FEW (*)    All other components within normal limits  URINALYSIS, ROUTINE W REFLEX MICROSCOPIC (NOT AT [facility])  PREGNANCY, URINE  GC/CHLAMYDIA PROBE AMP ([place]) NOT AT [facility]    Imaging Review No results  found. I have personally reviewed and evaluated these images and lab results as part of my medical decision-making.   EKG Interpretation None      MDM   Final diagnoses:  Vaginal discharge  BV (bacterial vaginosis)    Labs: Urinalysis, wet prep, pregnancy  Imaging:  Consults:  Therapeutics:  Discharge Meds: tinidazole   Assessment/Plan: Patient's presentation is most consistent with bacterial vaginosis. She has no signs of urinary tract infection, she is very minimally tender to palpation of the suprapubic region, no pain on the lateral pelvic regions or her upper abdomen. She has no vaginal bleeding or discharge that would indicate significant infection. She has no cervical motion tenderness, or masses felt on exam. Low suspicion for any significant infectious, torsion, or any other emergent GYN related etiology. Patient will be treated for bacterial vaginosis, instructed to use ibuprofen and Tylenol, and follow-up with primary care with health department in 3 days for reevaluation. She is instructed to return to the emergency room if any new or worsening signs or symptoms presented. Patient verbalized understanding and agreement to this plan and had no further questions or concerns discharge        Eyvonne Mechanic, PA-C 07/26/15 1708  Rolland Porter, MD 07/29/15 [phone]

## 2015-07-27 LAB — GC/CHLAMYDIA PROBE AMP (~~LOC~~) NOT AT ARMC
Chlamydia: NEGATIVE
Neisseria Gonorrhea: NEGATIVE

## 2015-08-16 ENCOUNTER — Emergency Department (HOSPITAL_BASED_OUTPATIENT_CLINIC_OR_DEPARTMENT_OTHER): Payer: Self-pay

## 2015-08-16 ENCOUNTER — Emergency Department (HOSPITAL_BASED_OUTPATIENT_CLINIC_OR_DEPARTMENT_OTHER)
Admission: EM | Admit: 2015-08-16 | Discharge: 2015-08-16 | Disposition: A | Discharge status: Home / Self Care | Payer: Self-pay | Attending: Emergency Medicine | Admitting: Emergency Medicine

## 2015-08-16 ENCOUNTER — Encounter (HOSPITAL_BASED_OUTPATIENT_CLINIC_OR_DEPARTMENT_OTHER): Payer: Self-pay | Admitting: *Deleted

## 2015-08-16 DIAGNOSIS — Z8659 Personal history of other mental and behavioral disorders: Secondary | ICD-10-CM | POA: Insufficient documentation

## 2015-08-16 DIAGNOSIS — Z862 Personal history of diseases of the blood and blood-forming organs and certain disorders involving the immune mechanism: Secondary | ICD-10-CM | POA: Insufficient documentation

## 2015-08-16 DIAGNOSIS — Z79899 Other long term (current) drug therapy: Secondary | ICD-10-CM | POA: Insufficient documentation

## 2015-08-16 DIAGNOSIS — R110 Nausea: Secondary | ICD-10-CM | POA: Insufficient documentation

## 2015-08-16 DIAGNOSIS — R079 Chest pain, unspecified: Secondary | ICD-10-CM | POA: Insufficient documentation

## 2015-08-16 DIAGNOSIS — M549 Dorsalgia, unspecified: Secondary | ICD-10-CM | POA: Insufficient documentation

## 2015-08-16 DIAGNOSIS — Z3202 Encounter for pregnancy test, result negative: Secondary | ICD-10-CM | POA: Insufficient documentation

## 2015-08-16 DIAGNOSIS — R109 Unspecified abdominal pain: Secondary | ICD-10-CM | POA: Insufficient documentation

## 2015-08-16 DIAGNOSIS — Z8639 Personal history of other endocrine, nutritional and metabolic disease: Secondary | ICD-10-CM | POA: Insufficient documentation

## 2015-08-16 DIAGNOSIS — Z792 Long term (current) use of antibiotics: Secondary | ICD-10-CM | POA: Insufficient documentation

## 2015-08-16 DIAGNOSIS — F17210 Nicotine dependence, cigarettes, uncomplicated: Secondary | ICD-10-CM | POA: Insufficient documentation

## 2015-08-16 DIAGNOSIS — Z8719 Personal history of other diseases of the digestive system: Secondary | ICD-10-CM | POA: Insufficient documentation

## 2015-08-16 DIAGNOSIS — Z8669 Personal history of other diseases of the nervous system and sense organs: Secondary | ICD-10-CM | POA: Insufficient documentation

## 2015-08-16 LAB — CBC WITH DIFFERENTIAL/PLATELET
Basophils Absolute: 0 10*3/uL (ref 0.0–0.1)
Basophils Relative: 0 %
EOS PCT: 3 %
Eosinophils Absolute: 0.1 10*3/uL (ref 0.0–0.7)
HCT: 31.8 % — ABNORMAL LOW (ref 36.0–46.0)
Hemoglobin: 10.3 g/dL — ABNORMAL LOW (ref 12.0–15.0)
Lymphocytes Relative: 55 %
Lymphs Abs: 2.8 10*3/uL (ref 0.7–4.0)
MCH: 24.3 pg — AB (ref 26.0–34.0)
MCHC: 32.4 g/dL (ref 30.0–36.0)
MCV: 75 fL — ABNORMAL LOW (ref 78.0–100.0)
MONOS PCT: 10 %
Monocytes Absolute: 0.5 10*3/uL (ref 0.1–1.0)
Neutro Abs: 1.6 10*3/uL — ABNORMAL LOW (ref 1.7–7.7)
Neutrophils Relative %: 32 %
Platelets: 247 10*3/uL (ref 150–400)
RBC: 4.24 MIL/uL (ref 3.87–5.11)
RDW: 14.7 % (ref 11.5–15.5)
WBC: 5 10*3/uL (ref 4.0–10.5)

## 2015-08-16 LAB — COMPREHENSIVE METABOLIC PANEL
ALT: 13 U/L — AB (ref 14–54)
ANION GAP: 5 (ref 5–15)
AST: 22 U/L (ref 15–41)
Albumin: 3.7 g/dL (ref 3.5–5.0)
Alkaline Phosphatase: 46 U/L (ref 38–126)
BUN: 13 mg/dL (ref 6–20)
CHLORIDE: 104 mmol/L (ref 101–111)
CO2: 26 mmol/L (ref 22–32)
CREATININE: 0.76 mg/dL (ref 0.44–1.00)
Calcium: 8.4 mg/dL — ABNORMAL LOW (ref 8.9–10.3)
Glucose, Bld: 83 mg/dL (ref 65–99)
Potassium: 5 mmol/L (ref 3.5–5.1)
SODIUM: 135 mmol/L (ref 135–145)
Total Bilirubin: 1.5 mg/dL — ABNORMAL HIGH (ref 0.3–1.2)
Total Protein: 6.7 g/dL (ref 6.5–8.1)

## 2015-08-16 LAB — URINALYSIS, ROUTINE W REFLEX MICROSCOPIC
Bilirubin Urine: NEGATIVE
GLUCOSE, UA: NEGATIVE mg/dL
Hgb urine dipstick: NEGATIVE
KETONES UR: NEGATIVE mg/dL
LEUKOCYTES UA: NEGATIVE
NITRITE: NEGATIVE
PROTEIN: NEGATIVE mg/dL
Specific Gravity, Urine: 1.024 (ref 1.005–1.030)
pH: 7 (ref 5.0–8.0)

## 2015-08-16 LAB — PREGNANCY, URINE: PREG TEST UR: NEGATIVE

## 2015-08-16 LAB — TROPONIN I

## 2015-08-16 LAB — LIPASE, BLOOD: LIPASE: 51 U/L (ref 11–51)

## 2015-08-16 MED ORDER — ONDANSETRON HCL 4 MG/2ML IJ SOLN
4.0000 mg | Freq: Once | INTRAMUSCULAR | Status: DC
Start: 2015-08-16 — End: 2015-08-17

## 2015-08-16 MED ORDER — HYDROCODONE-ACETAMINOPHEN 5-325 MG PO TABS: 1.0000 | ORAL_TABLET | Freq: Four times a day (QID) | ORAL | Status: DC | PRN

## 2015-08-16 MED ORDER — SODIUM CHLORIDE 0.9 % IV BOLUS (SEPSIS)
500.0000 mL | Freq: Once | INTRAVENOUS | Status: AC
  Administered 2015-08-16: 500 mL via INTRAVENOUS

## 2015-08-16 MED ORDER — SODIUM CHLORIDE 0.9 % IV SOLN: INTRAVENOUS | Status: DC

## 2015-08-16 MED ORDER — PROMETHAZINE HCL 25 MG PO TABS: 25.0000 mg | ORAL_TABLET | Freq: Four times a day (QID) | ORAL | Status: DC | PRN

## 2015-08-16 MED ORDER — ONDANSETRON 4 MG PO TBDP
4.0000 mg | ORAL_TABLET | Freq: Once | ORAL | Status: AC
  Administered 2015-08-16: 4 mg via ORAL
  Filled 2015-08-16: qty 1

## 2015-08-16 MED ORDER — IOHEXOL 300 MG/ML  SOLN
25.0000 mL | Freq: Once | INTRAMUSCULAR | Status: AC | PRN
  Administered 2015-08-16: 25 mL via ORAL

## 2015-08-16 NOTE — Discharge Instructions (Signed)
Workup for the left flank pain without any abnormal findings. Ultrasound showed no evidence of any kidney stones. A very well be muscular in nature. Take hydrocodone as directed. Return for any new or worse symptoms. Follow-up for the right-sided ovarian cyst is recommended.

## 2015-08-16 NOTE — ED Notes (Signed)
Per pt report lt side pain radiated below breast and to shoulder blade. Feeling nauseated. When she eats the pain increases.

## 2015-08-16 NOTE — ED Provider Notes (Signed)
CSN: [id number]     Arrival date & time 08/16/15  1459 History   First MD Initiated Contact with Patient 08/16/15 1551     Chief Complaint  Patient presents with  . Flank Pain     (Consider location/radiation/quality/duration/timing/severity/associated sxs/prior Treatment) Patient is a 35 y.o. female presenting with flank pain. The history is provided by the patient.  Flank Pain Associated symptoms include chest pain and abdominal pain. Pertinent negatives include no headaches and no shortness of breath.   patient with left-sided flank pain and left lower chest pain started 2 days ago. Made slightly worse with eating. Rare vomiting and some nausea no diarrhea no dysuria. Pain is intermittent. On the first day it lasted for about an hour then went away. Here more recently it's been lasting only about 5 minutes. Not occurring that frequently. No history of similar pain. No fevers.  Past Medical History  Diagnosis Date  . Anemia   . High cholesterol   . Anxiety   . Depression   . Insomnia   . Palpitation   . GERD (gastroesophageal reflux disease)   . PTSD (post-traumatic stress disorder)   . Anxiety    Past Surgical History  Procedure Laterality Date  . Breast reduction surgery    . Gallstone removal    . Wisdom tooth extraction     History reviewed. No pertinent family history. Social History  Substance Use Topics  . Smoking status: Current Every Day Smoker -- 0.50 packs/day    Types: Cigarettes  . Smokeless tobacco: Never Used  . Alcohol Use: No   OB History    No data available     Review of Systems  Constitutional: Negative for fever.  HENT: Negative for congestion.   Eyes: Negative for visual disturbance.  Respiratory: Negative for shortness of breath.   Cardiovascular: Positive for chest pain.  Gastrointestinal: Positive for nausea, vomiting and abdominal pain. Negative for diarrhea.  Genitourinary: Positive for flank pain. Negative for dysuria.   Musculoskeletal: Positive for back pain.  Skin: Negative for rash.  Neurological: Negative for headaches.  Hematological: Does not bruise/bleed easily.  Psychiatric/Behavioral: Negative for confusion.      Allergies  Aspirin and Contrast media  Home Medications   Prior to Admission medications   Medication Sig Start Date End Date Taking? Authorizing Provider  cholecalciferol (VITAMIN D) 1000 units tablet Take 1,000 Units by mouth daily.   Yes Historical Provider, MD  diphenhydramine-acetaminophen (TYLENOL PM) 25-500 MG TABS tablet Take 1 tablet by mouth at bedtime as needed.   Yes Historical Provider, MD  HYDROcodone-acetaminophen (NORCO/VICODIN) 5-325 MG tablet Take 1-2 tablets by mouth every 6 (six) hours as needed for moderate pain. 08/16/15   Vanetta Mulders, MD  tinidazole (TINDAMAX) 500 MG tablet Take 2 tablets (1,000 mg total) by mouth daily with breakfast. 07/26/15   Eyvonne Mechanic, PA-C   BP 109/66 mmHg  Pulse 55  Temp(Src) 98.9 F (37.2 C) (Oral)  Resp 18  Ht  (1.651 m)  Wt 65.772 kg  BMI 24.13 kg/m2  SpO2 100%  LMP 08/10/2015 (Approximate) Physical Exam  Constitutional: She is oriented to person, place, and time. She appears well-developed and well-nourished. No distress.  HENT:  Head: Normocephalic and atraumatic.  Mouth/Throat: Oropharynx is clear and moist.  Eyes: EOM are normal. Pupils are equal, round, and reactive to light.  Neck: Normal range of motion. Neck supple.  Cardiovascular: Normal rate, regular rhythm and normal heart sounds.   No murmur heard. Pulmonary/Chest: Effort  normal and breath sounds normal. No respiratory distress.  Abdominal: Soft. Bowel sounds are normal. There is no tenderness.  Musculoskeletal: Normal range of motion.  Neurological: She is alert and oriented to person, place, and time. No cranial nerve deficit. She exhibits normal muscle tone. Coordination normal.  Skin: Skin is warm. No rash noted.  Nursing note and vitals  reviewed.   ED Course  Procedures (including critical care time) Labs Review Labs Reviewed  COMPREHENSIVE METABOLIC PANEL - Abnormal; Notable for the following:    Calcium 8.4 (*)    ALT 13 (*)    Total Bilirubin 1.5 (*)    All other components within normal limits  CBC WITH DIFFERENTIAL/PLATELET - Abnormal; Notable for the following:    Hemoglobin 10.3 (*)    HCT 31.8 (*)    MCV 75.0 (*)    MCH 24.3 (*)    Neutro Abs 1.6 (*)    All other components within normal limits  URINALYSIS, ROUTINE W REFLEX MICROSCOPIC (NOT AT [facility])  PREGNANCY, URINE  TROPONIN I  LIPASE, BLOOD    Imaging Review Ct Abdomen Pelvis Wo Contrast  08/16/2015  CLINICAL DATA:  Left flank pain with nausea. EXAM: CT ABDOMEN AND PELVIS WITHOUT CONTRAST TECHNIQUE: Multidetector CT imaging of the abdomen and pelvis was performed following the standard protocol without IV contrast. COMPARISON:  07/16/2013 FINDINGS: Lower chest:  Unremarkable. Hepatobiliary: No focal abnormality in the liver on this study without intravenous contrast. No evidence of hepatomegaly. Gallbladder surgically absent. No intrahepatic or extrahepatic biliary dilation. Pancreas: No focal mass lesion. No dilatation of the main duct. No intraparenchymal cyst. No peripancreatic edema. Spleen: No splenomegaly. No focal mass lesion. Adrenals/Urinary Tract: No adrenal nodule or mass. No renal stones. No hydronephrosis in either kidney. No evidence for hydroureter. No definite ureteral stones. Almost all of the phleboliths in the anatomic pelvis are present on the previous study except for 1 left-sided calcifications seen on image 64 of series 2. This could potentially be a stone in the distal left ureter but there no substantial secondary changes in the left ureter or kidney. No evidence for bladder stones. Stomach/Bowel: Stomach is nondistended. No gastric wall thickening. No evidence of outlet obstruction. Duodenum is normally positioned as is the ligament of  Treitz. No small bowel wall thickening. No small bowel dilatation. The terminal ileum is normal. The appendix is normal. No gross colonic mass. No colonic wall thickening. No substantial diverticular change. Vascular/Lymphatic: No abdominal aortic aneurysm. No abdominal atherosclerotic calcification. There is no gastrohepatic or hepatoduodenal ligament lymphadenopathy. No intraperitoneal or retroperitoneal lymphadenopathy. No pelvic sidewall lymphadenopathy. Reproductive: The uterus has normal CT imaging appearance. 5.0 x 5.6 cm cystic lesion identified in the right ovary. Dominant follicle noted in the left ovary. Other: Trace free fluid noted in the cul-de-sac. Musculoskeletal: Bone windows reveal no worrisome lytic or sclerotic osseous lesions. IMPRESSION: 1. No definite urinary stone disease. The patient has multiple phleboliths in the anatomic pelvis in the distal ureters are not well seen. There is a single calcification in the left inferior pelvis that was not present on the previous study and while likely a phlebolith given the lack of secondary changes in the left kidney and ureter, a distal left ureteral stone is not entirely excluded. 2. 5.6 cm cystic lesion in the right ovary. At this size threshold, follow-up ultrasound in 6-12 weeks is recommended to ensure resolution. This recommendation follows ACR consensus guidelines: White Paper of the ACR Incidental Findings Committee II on Adnexal Findings. J  Am Coll Radiol [phone]. Electronically Signed   By: Kennith Center M.D.   On: 08/16/2015 17:58   Dg Chest 2 View  08/16/2015  CLINICAL DATA:  Left chest pain, initial encounter EXAM: CHEST  2 VIEW COMPARISON:  06/06/2015 FINDINGS: The heart size and mediastinal contours are within normal limits. Both lungs are clear. The visualized skeletal structures are unremarkable. IMPRESSION: No active cardiopulmonary disease. Electronically Signed   By: Alcide Clever M.D.   On: 08/16/2015 18:16   US  Renal  08/16/2015  CLINICAL DATA:  Left flank pain for 3 days.  Question kidney stone. EXAM: RENAL / URINARY TRACT ULTRASOUND COMPLETE COMPARISON:  Abdominal CT earlier this day. FINDINGS: Right Kidney: Length: 10.8 cm. Echogenicity within normal limits. No mass or hydronephrosis visualized. No shadowing stone. Left Kidney: Length: 10.6 cm. Echogenicity within normal limits. Mild fullness of the renal pelvis without frank hydronephrosis. No shadowing stone. Bladder: Only minimally distended, not well evaluated. The right ovarian cyst measuring 5.4 cm is incidentally noted. IMPRESSION: 1. Mild fullness of the left renal pelvis without frank hydronephrosis. No renal calculi. 2. Right ovarian cyst measuring 5.5 cm, incidentally noted. Electronically Signed   By: Rubye Oaks M.D.   On: 08/16/2015 22:34   I have personally reviewed and evaluated these images and lab results as part of my medical decision-making.   EKG Interpretation   Date/Time:  Wednesday August 16 2015 16:19:50 EST Ventricular Rate:  53 PR Interval:  159 QRS Duration: 99 QT Interval:  412 QTC Calculation: 387 R Axis:   76 Text Interpretation:  Sinus rhythm Confirmed by Mathayus Stanbery  MD, Norvell Ureste  ([id number]) on 08/16/2015 4:28:12 PM      MDM   Final diagnoses:  Flank pain    Workup for the left-sided flank pain without any evidence of a kidney stone or any acute findings on CT scan. Labs without significant abnormalities no leukocytosis no significant anemia no significant collection site abnormalities other than elevation in the bilirubin at 1.5.  Patient also with a negative cardiac workup EKG without acute changes. Chest x-ray negative troponin was negative. Patient's pain has been intermittent about 5 minutes at the longus here today. Did have about an hour worth of pain in the left flank and left lower chest yesterday. No evidence of acute cardiac Event  Suspect symptoms may be could be musculoskeletal in nature. Patient  developed some problems with nausea and vomiting following the oral contrast.  We'll treat patient symptomatically and have her return for any new or worse symptoms.    Vanetta Mulders, MD 08/16/15 2250

## 2015-08-24 ENCOUNTER — Encounter (HOSPITAL_BASED_OUTPATIENT_CLINIC_OR_DEPARTMENT_OTHER): Payer: Self-pay | Admitting: Emergency Medicine

## 2015-08-24 ENCOUNTER — Emergency Department (HOSPITAL_BASED_OUTPATIENT_CLINIC_OR_DEPARTMENT_OTHER): Payer: Self-pay

## 2015-08-24 ENCOUNTER — Emergency Department (HOSPITAL_BASED_OUTPATIENT_CLINIC_OR_DEPARTMENT_OTHER)
Admission: EM | Admit: 2015-08-24 | Discharge: 2015-08-25 | Disposition: A | Discharge status: Home / Self Care | Payer: Self-pay | Attending: Emergency Medicine | Admitting: Emergency Medicine

## 2015-08-24 DIAGNOSIS — Z8659 Personal history of other mental and behavioral disorders: Secondary | ICD-10-CM | POA: Insufficient documentation

## 2015-08-24 DIAGNOSIS — Z862 Personal history of diseases of the blood and blood-forming organs and certain disorders involving the immune mechanism: Secondary | ICD-10-CM | POA: Insufficient documentation

## 2015-08-24 DIAGNOSIS — Z79899 Other long term (current) drug therapy: Secondary | ICD-10-CM | POA: Insufficient documentation

## 2015-08-24 DIAGNOSIS — Z8669 Personal history of other diseases of the nervous system and sense organs: Secondary | ICD-10-CM | POA: Insufficient documentation

## 2015-08-24 DIAGNOSIS — N83202 Unspecified ovarian cyst, left side: Secondary | ICD-10-CM | POA: Insufficient documentation

## 2015-08-24 DIAGNOSIS — R102 Pelvic and perineal pain: Secondary | ICD-10-CM

## 2015-08-24 DIAGNOSIS — R1032 Left lower quadrant pain: Secondary | ICD-10-CM

## 2015-08-24 DIAGNOSIS — Z3202 Encounter for pregnancy test, result negative: Secondary | ICD-10-CM | POA: Insufficient documentation

## 2015-08-24 DIAGNOSIS — Z8719 Personal history of other diseases of the digestive system: Secondary | ICD-10-CM | POA: Insufficient documentation

## 2015-08-24 DIAGNOSIS — F17210 Nicotine dependence, cigarettes, uncomplicated: Secondary | ICD-10-CM | POA: Insufficient documentation

## 2015-08-24 DIAGNOSIS — Z8639 Personal history of other endocrine, nutritional and metabolic disease: Secondary | ICD-10-CM | POA: Insufficient documentation

## 2015-08-24 DIAGNOSIS — B9689 Other specified bacterial agents as the cause of diseases classified elsewhere: Secondary | ICD-10-CM

## 2015-08-24 DIAGNOSIS — Z792 Long term (current) use of antibiotics: Secondary | ICD-10-CM | POA: Insufficient documentation

## 2015-08-24 DIAGNOSIS — N760 Acute vaginitis: Secondary | ICD-10-CM | POA: Insufficient documentation

## 2015-08-24 LAB — URINALYSIS, ROUTINE W REFLEX MICROSCOPIC
BILIRUBIN URINE: NEGATIVE
Glucose, UA: NEGATIVE mg/dL
Hgb urine dipstick: NEGATIVE
Ketones, ur: NEGATIVE mg/dL
Leukocytes, UA: NEGATIVE
NITRITE: NEGATIVE
Protein, ur: NEGATIVE mg/dL
SPECIFIC GRAVITY, URINE: 1.015 (ref 1.005–1.030)
pH: 6 (ref 5.0–8.0)

## 2015-08-24 LAB — PREGNANCY, URINE: PREG TEST UR: NEGATIVE

## 2015-08-24 MED ORDER — MORPHINE SULFATE (PF) 4 MG/ML IV SOLN: 4.0000 mg | Freq: Once | INTRAVENOUS | Status: DC

## 2015-08-24 NOTE — ED Notes (Signed)
Left flank pain and LLQ pain x2 hours.

## 2015-08-24 NOTE — ED Provider Notes (Signed)
CSN: [id number]     Arrival date & time 08/24/15  1848 History   First MD Initiated Contact with Patient 08/24/15 2157     Chief Complaint  Patient presents with  . Flank Pain     (Consider location/radiation/quality/duration/timing/severity/associated sxs/prior Treatment) HPI Comments: Patient presents to the emergency department with chief complaint of left-sided pelvic pain. She states that she has been having symptoms for the past 2 weeks. She states that she has been seen previously, but no diagnosis was found. She states that she has had some crampy abdominal pain on the left lower side. She denies any diarrhea. She states that she has had some vaginal discharge. Additionally, she has a history of yeast infections and bacterial vaginosis. She denies any associated fevers or chills. She has not been taking any antibiotics. Her symptoms are worsened with palpation.  The history is provided by the patient. No language interpreter was used.    Past Medical History  Diagnosis Date  . Anemia   . High cholesterol   . Anxiety   . Depression   . Insomnia   . Palpitation   . GERD (gastroesophageal reflux disease)   . PTSD (post-traumatic stress disorder)   . Anxiety    Past Surgical History  Procedure Laterality Date  . Breast reduction surgery    . Gallstone removal    . Wisdom tooth extraction     No family history on file. Social History  Substance Use Topics  . Smoking status: Current Every Day Smoker -- 0.50 packs/day    Types: Cigarettes  . Smokeless tobacco: Never Used  . Alcohol Use: No   OB History    No data available     Review of Systems  Constitutional: Negative for fever and chills.  Respiratory: Negative for shortness of breath.   Cardiovascular: Negative for chest pain.  Gastrointestinal: Positive for abdominal pain. Negative for nausea, vomiting, diarrhea and constipation.  Genitourinary: Negative for dysuria.  All other systems reviewed and are  negative.     Allergies  Aspirin and Contrast media  Home Medications   Prior to Admission medications   Medication Sig Start Date End Date Taking? Authorizing Provider  cholecalciferol (VITAMIN D) 1000 units tablet Take 1,000 Units by mouth daily.    Historical Provider, MD  diphenhydramine-acetaminophen (TYLENOL PM) 25-500 MG TABS tablet Take 1 tablet by mouth at bedtime as needed.    Historical Provider, MD  HYDROcodone-acetaminophen (NORCO/VICODIN) 5-325 MG tablet Take 1-2 tablets by mouth every 6 (six) hours as needed for moderate pain. 08/16/15   Vanetta Mulders, MD  promethazine (PHENERGAN) 25 MG tablet Take 1 tablet (25 mg total) by mouth every 6 (six) hours as needed for nausea or vomiting. 08/16/15   Vanetta Mulders, MD  tinidazole (TINDAMAX) 500 MG tablet Take 2 tablets (1,000 mg total) by mouth daily with breakfast. 07/26/15   Eyvonne Mechanic, PA-C   BP 148/97 mmHg  Pulse 58  Temp(Src) 98.1 F (36.7 C) (Oral)  Resp 18  Ht  (1.651 m)  Wt 65.772 kg  BMI 24.13 kg/m2  SpO2 100%  LMP 08/10/2015 (Approximate) Physical Exam  Constitutional: She is oriented to person, place, and time. She appears well-developed and well-nourished.  HENT:  Head: Normocephalic and atraumatic.  Eyes: Conjunctivae and EOM are normal. Pupils are equal, round, and reactive to light.  Neck: Normal range of motion. Neck supple.  Cardiovascular: Normal rate and regular rhythm.  Exam reveals no gallop and no friction rub.  No murmur heard. Pulmonary/Chest: Effort normal and breath sounds normal. No respiratory distress. She has no wheezes. She has no rales. She exhibits no tenderness.  Abdominal: Soft. Bowel sounds are normal. She exhibits no distension and no mass. There is tenderness. There is no rebound and no guarding.  Left lower quadrant tender to palpation  Genitourinary:  Pelvic exam chaperoned by female ER tech, no right adnexal tenderness, mild left adnexal tenderness, no uterine  tenderness, moderate vaginal discharge, no bleeding, no CMT or friability, no foreign body, no injury to the external genitalia, no other significant findings   Musculoskeletal: Normal range of motion. She exhibits no edema or tenderness.  Neurological: She is alert and oriented to person, place, and time.  Skin: Skin is warm and dry.  Psychiatric: She has a normal mood and affect. Her behavior is normal. Judgment and thought content normal.  Nursing note and vitals reviewed.   ED Course  Procedures (including critical care time) Results for orders placed or performed during the hospital encounter of 08/24/15  Wet prep, genital  Result Value Ref Range   Yeast Wet Prep HPF POC NONE SEEN NONE SEEN   Trich, Wet Prep NONE SEEN NONE SEEN   Clue Cells Wet Prep HPF POC PRESENT (A) NONE SEEN   WBC, Wet Prep HPF POC MANY (A) NONE SEEN   Sperm NONE SEEN   Urinalysis, Routine w reflex microscopic (not at [facility])  Result Value Ref Range   Color, Urine YELLOW YELLOW   APPearance CLEAR CLEAR   Specific Gravity, Urine 1.015 1.005 - 1.030   pH 6.0 5.0 - 8.0   Glucose, UA NEGATIVE NEGATIVE mg/dL   Hgb urine dipstick NEGATIVE NEGATIVE   Bilirubin Urine NEGATIVE NEGATIVE   Ketones, ur NEGATIVE NEGATIVE mg/dL   Protein, ur NEGATIVE NEGATIVE mg/dL   Nitrite NEGATIVE NEGATIVE   Leukocytes, UA NEGATIVE NEGATIVE  Pregnancy, urine  Result Value Ref Range   Preg Test, Ur NEGATIVE NEGATIVE   Ct Abdomen Pelvis Wo Contrast  08/16/2015  CLINICAL DATA:  Left flank pain with nausea. EXAM: CT ABDOMEN AND PELVIS WITHOUT CONTRAST TECHNIQUE: Multidetector CT imaging of the abdomen and pelvis was performed following the standard protocol without IV contrast. COMPARISON:  07/16/2013 FINDINGS: Lower chest:  Unremarkable. Hepatobiliary: No focal abnormality in the liver on this study without intravenous contrast. No evidence of hepatomegaly. Gallbladder surgically absent. No intrahepatic or extrahepatic biliary dilation.  Pancreas: No focal mass lesion. No dilatation of the main duct. No intraparenchymal cyst. No peripancreatic edema. Spleen: No splenomegaly. No focal mass lesion. Adrenals/Urinary Tract: No adrenal nodule or mass. No renal stones. No hydronephrosis in either kidney. No evidence for hydroureter. No definite ureteral stones. Almost all of the phleboliths in the anatomic pelvis are present on the previous study except for 1 left-sided calcifications seen on image 64 of series 2. This could potentially be a stone in the distal left ureter but there no substantial secondary changes in the left ureter or kidney. No evidence for bladder stones. Stomach/Bowel: Stomach is nondistended. No gastric wall thickening. No evidence of outlet obstruction. Duodenum is normally positioned as is the ligament of Treitz. No small bowel wall thickening. No small bowel dilatation. The terminal ileum is normal. The appendix is normal. No gross colonic mass. No colonic wall thickening. No substantial diverticular change. Vascular/Lymphatic: No abdominal aortic aneurysm. No abdominal atherosclerotic calcification. There is no gastrohepatic or hepatoduodenal ligament lymphadenopathy. No intraperitoneal or retroperitoneal lymphadenopathy. No pelvic sidewall lymphadenopathy. Reproductive: The uterus has  normal CT imaging appearance. 5.0 x 5.6 cm cystic lesion identified in the right ovary. Dominant follicle noted in the left ovary. Other: Trace free fluid noted in the cul-de-sac. Musculoskeletal: Bone windows reveal no worrisome lytic or sclerotic osseous lesions. IMPRESSION: 1. No definite urinary stone disease. The patient has multiple phleboliths in the anatomic pelvis in the distal ureters are not well seen. There is a single calcification in the left inferior pelvis that was not present on the previous study and while likely a phlebolith given the lack of secondary changes in the left kidney and ureter, a distal left ureteral stone is not  entirely excluded. 2. 5.6 cm cystic lesion in the right ovary. At this size threshold, follow-up ultrasound in 6-12 weeks is recommended to ensure resolution. This recommendation follows ACR consensus guidelines: White Paper of the ACR Incidental Findings Committee II on Adnexal Findings. J Am Coll Radiol [phone]. Electronically Signed   By: Kennith Center M.D.   On: 08/16/2015 17:58   Dg Chest 2 View  08/16/2015  CLINICAL DATA:  Left chest pain, initial encounter EXAM: CHEST  2 VIEW COMPARISON:  06/06/2015 FINDINGS: The heart size and mediastinal contours are within normal limits. Both lungs are clear. The visualized skeletal structures are unremarkable. IMPRESSION: No active cardiopulmonary disease. Electronically Signed   By: Alcide Clever M.D.   On: 08/16/2015 18:16   US Transvaginal Non-ob  08/25/2015  CLINICAL DATA:  LEFT pelvic pain for 4 hours. Diagnosed with ovarian cyst August 16, 2015. EXAM: TRANSABDOMINAL AND TRANSVAGINAL ULTRASOUND OF PELVIS DOPPLER ULTRASOUND OF OVARIES TECHNIQUE: Both transabdominal and transvaginal ultrasound examinations of the pelvis were performed. Transabdominal technique was performed for global imaging of the pelvis including uterus, ovaries, adnexal regions, and pelvic cul-de-sac. It was necessary to proceed with endovaginal exam following the transabdominal exam to visualize the ovaries. Color and duplex Doppler ultrasound was utilized to evaluate blood flow to the ovaries. COMPARISON:  Ultrasound of the abdomen August 16, 2015 and CT abdomen and pelvis August 16, 2015 FINDINGS: Uterus Measurements: 7.1 x 3.6 x 4.5 cm. 9 x 7 x 8 mm RIGHT mid uterine body intramural leiomyoma with additional suspected posterior uterine wall leiomyomas versus artifact due to heterogeneous myometrium. Endometrium Thickness: 8 mm.  No focal abnormality visualized. Right ovary Measurements: 5.2 x 3.4 x 5.4 cm. 4.2 x 3 x 4.6 cm anechoic cyst corresponding to recent CT finding. Left  ovary Measurements: 4.1 x 2.3 x 3 cm. 1.5 x 0.9 x 1.4 cm hypoechoic probable hemorrhagic cyst, without significant acoustic enhancement, no superimposed vascularity. Pulsed Doppler evaluation of both ovaries demonstrates normal low-resistance arterial and venous waveforms. Other findings No abnormal free fluid. IMPRESSION: Probable 1.5 cm LEFT ovarian hemorrhagic cyst; recommend follow-up pelvic ultrasound in 6-12 weeks. 4.2 x 3 x 4.6 cm RIGHT adnexal cyst. At least 1 subcentimeter intramural leiomyoma. Electronically Signed   By: Awilda Metro M.D.   On: 08/25/2015 01:09   US Pelvis Complete  08/25/2015  CLINICAL DATA:  LEFT pelvic pain for 4 hours. Diagnosed with ovarian cyst August 16, 2015. EXAM: TRANSABDOMINAL AND TRANSVAGINAL ULTRASOUND OF PELVIS DOPPLER ULTRASOUND OF OVARIES TECHNIQUE: Both transabdominal and transvaginal ultrasound examinations of the pelvis were performed. Transabdominal technique was performed for global imaging of the pelvis including uterus, ovaries, adnexal regions, and pelvic cul-de-sac. It was necessary to proceed with endovaginal exam following the transabdominal exam to visualize the ovaries. Color and duplex Doppler ultrasound was utilized to evaluate blood flow to the ovaries. COMPARISON:  Ultrasound  of the abdomen August 16, 2015 and CT abdomen and pelvis August 16, 2015 FINDINGS: Uterus Measurements: 7.1 x 3.6 x 4.5 cm. 9 x 7 x 8 mm RIGHT mid uterine body intramural leiomyoma with additional suspected posterior uterine wall leiomyomas versus artifact due to heterogeneous myometrium. Endometrium Thickness: 8 mm.  No focal abnormality visualized. Right ovary Measurements: 5.2 x 3.4 x 5.4 cm. 4.2 x 3 x 4.6 cm anechoic cyst corresponding to recent CT finding. Left ovary Measurements: 4.1 x 2.3 x 3 cm. 1.5 x 0.9 x 1.4 cm hypoechoic probable hemorrhagic cyst, without significant acoustic enhancement, no superimposed vascularity. Pulsed Doppler evaluation of both ovaries  demonstrates normal low-resistance arterial and venous waveforms. Other findings No abnormal free fluid. IMPRESSION: Probable 1.5 cm LEFT ovarian hemorrhagic cyst; recommend follow-up pelvic ultrasound in 6-12 weeks. 4.2 x 3 x 4.6 cm RIGHT adnexal cyst. At least 1 subcentimeter intramural leiomyoma. Electronically Signed   By: Awilda Metro M.D.   On: 08/25/2015 01:09   US Renal  08/16/2015  CLINICAL DATA:  Left flank pain for 3 days.  Question kidney stone. EXAM: RENAL / URINARY TRACT ULTRASOUND COMPLETE COMPARISON:  Abdominal CT earlier this day. FINDINGS: Right Kidney: Length: 10.8 cm. Echogenicity within normal limits. No mass or hydronephrosis visualized. No shadowing stone. Left Kidney: Length: 10.6 cm. Echogenicity within normal limits. Mild fullness of the renal pelvis without frank hydronephrosis. No shadowing stone. Bladder: Only minimally distended, not well evaluated. The right ovarian cyst measuring 5.4 cm is incidentally noted. IMPRESSION: 1. Mild fullness of the left renal pelvis without frank hydronephrosis. No renal calculi. 2. Right ovarian cyst measuring 5.5 cm, incidentally noted. Electronically Signed   By: Rubye Oaks M.D.   On: 08/16/2015 22:34   [place] Art/ven Flow Abd Pelv Doppler  08/25/2015  CLINICAL DATA:  LEFT pelvic pain for 4 hours. Diagnosed with ovarian cyst August 16, 2015. EXAM: TRANSABDOMINAL AND TRANSVAGINAL ULTRASOUND OF PELVIS DOPPLER ULTRASOUND OF OVARIES TECHNIQUE: Both transabdominal and transvaginal ultrasound examinations of the pelvis were performed. Transabdominal technique was performed for global imaging of the pelvis including uterus, ovaries, adnexal regions, and pelvic cul-de-sac. It was necessary to proceed with endovaginal exam following the transabdominal exam to visualize the ovaries. Color and duplex Doppler ultrasound was utilized to evaluate blood flow to the ovaries. COMPARISON:  Ultrasound of the abdomen August 16, 2015 and CT abdomen and  pelvis August 16, 2015 FINDINGS: Uterus Measurements: 7.1 x 3.6 x 4.5 cm. 9 x 7 x 8 mm RIGHT mid uterine body intramural leiomyoma with additional suspected posterior uterine wall leiomyomas versus artifact due to heterogeneous myometrium. Endometrium Thickness: 8 mm.  No focal abnormality visualized. Right ovary Measurements: 5.2 x 3.4 x 5.4 cm. 4.2 x 3 x 4.6 cm anechoic cyst corresponding to recent CT finding. Left ovary Measurements: 4.1 x 2.3 x 3 cm. 1.5 x 0.9 x 1.4 cm hypoechoic probable hemorrhagic cyst, without significant acoustic enhancement, no superimposed vascularity. Pulsed Doppler evaluation of both ovaries demonstrates normal low-resistance arterial and venous waveforms. Other findings No abnormal free fluid. IMPRESSION: Probable 1.5 cm LEFT ovarian hemorrhagic cyst; recommend follow-up pelvic ultrasound in 6-12 weeks. 4.2 x 3 x 4.6 cm RIGHT adnexal cyst. At least 1 subcentimeter intramural leiomyoma. Electronically Signed   By: Awilda Metro M.D.   On: 08/25/2015 01:09    I have personally reviewed and evaluated these images and lab results as part of my medical decision-making.    MDM   Final diagnoses:  BV (bacterial vaginosis)  Left lower quadrant pain  Left ovarian cyst    Patient with left-sided abdominal pain, mostly in the lower abdomen/adnexa. Will check ultrasound, and pelvic exam. Will check urinalysis. Will reassess. Patient is afebrile. Vital signs are stable. Doubt acute abdomen.  Pelvic exam remarkable for mild adnexal tenderness on the left. Ultrasound pending. Urinalysis negative. Segments test negative. Wet prep remarkable for clue cells, will treat for bacterial vaginosis. Will also give Diflucan for after patient finishes Flagyl treatment. Recent gonorrhea and Chlamydia tests were negative, I did collect these again today for repeat testing, but will not treat tonight based on recent negative results. Patient understands that she needs to follow-up with  OB/GYN. She is stable and ready for discharge.  Ultrasound remarkable for as above. Follow-up per radiology recommendations. Patient given these instructions.  Roxy Horseman, PA-C 08/25/15 [id number]  Eber Hong, MD 08/25/15 [phone]

## 2015-08-24 NOTE — ED Notes (Signed)
Pt states she was here 2 weeks ago and "they did a lot of tests, but didn't find anything." Today had similar crampy pain that began in the left flank area and radiated to the LLQ. Denies urinary s/s. Thick, white vaginal d/c. Hx yeast infections and BV.

## 2015-08-25 ENCOUNTER — Emergency Department (HOSPITAL_BASED_OUTPATIENT_CLINIC_OR_DEPARTMENT_OTHER): Payer: Self-pay

## 2015-08-25 LAB — GC/CHLAMYDIA PROBE AMP (~~LOC~~) NOT AT ARMC
Chlamydia: NEGATIVE
NEISSERIA GONORRHEA: NEGATIVE

## 2015-08-25 LAB — WET PREP, GENITAL
Sperm: NONE SEEN
Trich, Wet Prep: NONE SEEN
Yeast Wet Prep HPF POC: NONE SEEN

## 2015-08-25 MED ORDER — FLUCONAZOLE 150 MG PO TABS: 150.0000 mg | ORAL_TABLET | Freq: Once | ORAL | Status: DC

## 2015-08-25 MED ORDER — METRONIDAZOLE 500 MG PO TABS: 500.0000 mg | ORAL_TABLET | Freq: Two times a day (BID) | ORAL | Status: DC

## 2015-08-25 MED ORDER — HYDROCODONE-ACETAMINOPHEN 5-325 MG PO TABS: 2.0000 | ORAL_TABLET | ORAL | Status: DC | PRN

## 2015-08-25 NOTE — ED Notes (Signed)
Pt given d/c instructions as per chart. Verbalizes understanding. No questions. Rx x 3 

## 2015-08-25 NOTE — Discharge Instructions (Signed)
Abdominal Pain, Adult °Many things can cause abdominal pain. Usually, abdominal pain is not caused by a disease and will improve without treatment. It can often be observed and treated at home. Your health care provider will do a physical exam and possibly order blood tests and X-rays to help determine the seriousness of your pain. However, in many cases, more time must pass before a clear cause of the pain can be found. Before that point, your health care provider may not know if you need more testing or further treatment. °HOME CARE INSTRUCTIONS °Monitor your abdominal pain for any changes. The following actions may help to alleviate any discomfort you are experiencing: °· Only take over-the-counter or prescription medicines as directed by your health care provider. °· Do not take laxatives unless directed to do so by your health care provider. °· Try a clear liquid diet (broth, tea, or water) as directed by your health care provider. Slowly move to a bland diet as tolerated. °SEEK MEDICAL CARE IF: °· You have unexplained abdominal pain. °· You have abdominal pain associated with nausea or diarrhea. °· You have pain when you urinate or have a bowel movement. °· You experience abdominal pain that wakes you in the night. °· You have abdominal pain that is worsened or improved by eating food. °· You have abdominal pain that is worsened with eating fatty foods. °· You have a fever. °SEEK IMMEDIATE MEDICAL CARE IF: °· Your pain does not go away within 2 hours. °· You keep throwing up (vomiting). °· Your pain is felt only in portions of the abdomen, such as the right side or the left lower portion of the abdomen. °· You pass bloody or black tarry stools. °MAKE SURE YOU: °· Understand these instructions. °· Will watch your condition. °· Will get help right away if you are not doing well or get worse. °  °This information is not intended to replace advice given to you by your health care provider. Make sure you discuss  any questions you have with your health care provider. °  °Document Released: 03/20/2005 Document Revised: 03/01/2015 Document Reviewed: 02/17/2013 °Elsevier Interactive Patient Education ©2016 [employer] ° °Bacterial Vaginosis °Bacterial vaginosis is a vaginal infection that occurs when the normal balance of bacteria in the vagina is disrupted. It results from an overgrowth of certain bacteria. This is the most common vaginal infection in women of childbearing age. Treatment is important to prevent complications, especially in pregnant women, as it can cause a premature delivery. °CAUSES  °Bacterial vaginosis is caused by an increase in harmful bacteria that are normally present in smaller amounts in the vagina. Several different kinds of bacteria can cause bacterial vaginosis. However, the reason that the condition develops is not fully understood. °RISK FACTORS °Certain activities or behaviors can put you at an increased risk of developing bacterial vaginosis, including: °· Having a new sex partner or multiple sex partners. °· Douching. °· Using an intrauterine device (IUD) for contraception. °Women do not get bacterial vaginosis from toilet seats, bedding, swimming pools, or contact with objects around them. °SIGNS AND SYMPTOMS  °Some women with bacterial vaginosis have no signs or symptoms. Common symptoms include: °· Grey vaginal discharge. °· A fishlike odor with discharge, especially after sexual intercourse. °· Itching or burning of the vagina and vulva. °· Burning or pain with urination. °DIAGNOSIS  °Your health care provider will take a medical history and examine the vagina for signs of bacterial vaginosis. A sample of vaginal fluid may   be taken. Your health care provider will look at this sample under a microscope to check for bacteria and abnormal cells. A vaginal pH test may also be done.  °TREATMENT  °Bacterial vaginosis may be treated with antibiotic medicines. These may be given in the form of a  pill or a vaginal cream. A second round of antibiotics may be prescribed if the condition comes back after treatment. Because bacterial vaginosis increases your risk for sexually transmitted diseases, getting treated can help reduce your risk for chlamydia, gonorrhea, HIV, and herpes. °HOME CARE INSTRUCTIONS  °· Only take over-the-counter or prescription medicines as directed by your health care provider. °· If antibiotic medicine was prescribed, take it as directed. Make sure you finish it even if you start to feel better. °· Tell all sexual partners that you have a vaginal infection. They should see their health care provider and be treated if they have problems, such as a mild rash or itching. °· During treatment, it is important that you follow these instructions: °¨ Avoid sexual activity or use condoms correctly. °¨ Do not douche. °¨ Avoid alcohol as directed by your health care provider. °¨ Avoid breastfeeding as directed by your health care provider. °SEEK MEDICAL CARE IF:  °· Your symptoms are not improving after 3 days of treatment. °· You have increased discharge or pain. °· You have a fever. °MAKE SURE YOU:  °· Understand these instructions. °· Will watch your condition. °· Will get help right away if you are not doing well or get worse. °FOR MORE INFORMATION  °Centers for Disease Control and Prevention, Division of STD Prevention: www.cdc.gov/std °American Sexual Health Association (ASHA): www.ashastd.org  °  °This information is not intended to replace advice given to you by your health care provider. Make sure you discuss any questions you have with your health care provider. °  °Document Released: 06/10/2005 Document Revised: 07/01/2014 Document Reviewed: 01/20/2013 °Elsevier Interactive Patient Education ©2016 [employer] ° ° °

## 2015-08-28 ENCOUNTER — Emergency Department (HOSPITAL_BASED_OUTPATIENT_CLINIC_OR_DEPARTMENT_OTHER): Payer: Self-pay

## 2015-08-28 ENCOUNTER — Emergency Department (HOSPITAL_BASED_OUTPATIENT_CLINIC_OR_DEPARTMENT_OTHER)
Admission: EM | Admit: 2015-08-28 | Discharge: 2015-08-29 | Disposition: A | Discharge status: Home / Self Care | Payer: Self-pay | Attending: Emergency Medicine | Admitting: Emergency Medicine

## 2015-08-28 ENCOUNTER — Encounter (HOSPITAL_BASED_OUTPATIENT_CLINIC_OR_DEPARTMENT_OTHER): Payer: Self-pay

## 2015-08-28 DIAGNOSIS — Z3202 Encounter for pregnancy test, result negative: Secondary | ICD-10-CM | POA: Insufficient documentation

## 2015-08-28 DIAGNOSIS — Z792 Long term (current) use of antibiotics: Secondary | ICD-10-CM | POA: Insufficient documentation

## 2015-08-28 DIAGNOSIS — Z8719 Personal history of other diseases of the digestive system: Secondary | ICD-10-CM | POA: Insufficient documentation

## 2015-08-28 DIAGNOSIS — Z8659 Personal history of other mental and behavioral disorders: Secondary | ICD-10-CM | POA: Insufficient documentation

## 2015-08-28 DIAGNOSIS — Z862 Personal history of diseases of the blood and blood-forming organs and certain disorders involving the immune mechanism: Secondary | ICD-10-CM | POA: Insufficient documentation

## 2015-08-28 DIAGNOSIS — Z8669 Personal history of other diseases of the nervous system and sense organs: Secondary | ICD-10-CM | POA: Insufficient documentation

## 2015-08-28 DIAGNOSIS — F17210 Nicotine dependence, cigarettes, uncomplicated: Secondary | ICD-10-CM | POA: Insufficient documentation

## 2015-08-28 DIAGNOSIS — Z79899 Other long term (current) drug therapy: Secondary | ICD-10-CM | POA: Insufficient documentation

## 2015-08-28 DIAGNOSIS — R079 Chest pain, unspecified: Secondary | ICD-10-CM | POA: Insufficient documentation

## 2015-08-28 DIAGNOSIS — Z8639 Personal history of other endocrine, nutritional and metabolic disease: Secondary | ICD-10-CM | POA: Insufficient documentation

## 2015-08-28 LAB — DIFFERENTIAL
Basophils Absolute: 0 10*3/uL (ref 0.0–0.1)
Basophils Relative: 0 %
EOS ABS: 0.3 10*3/uL (ref 0.0–0.7)
Eosinophils Relative: 5 %
LYMPHS ABS: 2.9 10*3/uL (ref 0.7–4.0)
LYMPHS PCT: 52 %
MONOS PCT: 10 %
Monocytes Absolute: 0.6 10*3/uL (ref 0.1–1.0)
NEUTROS ABS: 1.8 10*3/uL (ref 1.7–7.7)
NEUTROS PCT: 33 %

## 2015-08-28 LAB — BASIC METABOLIC PANEL
ANION GAP: 5 (ref 5–15)
BUN: 16 mg/dL (ref 6–20)
CALCIUM: 8.4 mg/dL — AB (ref 8.9–10.3)
CO2: 26 mmol/L (ref 22–32)
CREATININE: 0.81 mg/dL (ref 0.44–1.00)
Chloride: 106 mmol/L (ref 101–111)
Glucose, Bld: 90 mg/dL (ref 65–99)
Potassium: 3.5 mmol/L (ref 3.5–5.1)
Sodium: 137 mmol/L (ref 135–145)

## 2015-08-28 LAB — CBC
HCT: 29.2 % — ABNORMAL LOW (ref 36.0–46.0)
Hemoglobin: 9.5 g/dL — ABNORMAL LOW (ref 12.0–15.0)
MCH: 24.4 pg — AB (ref 26.0–34.0)
MCHC: 32.5 g/dL (ref 30.0–36.0)
MCV: 74.9 fL — ABNORMAL LOW (ref 78.0–100.0)
PLATELETS: 228 10*3/uL (ref 150–400)
RBC: 3.9 MIL/uL (ref 3.87–5.11)
RDW: 14.8 % (ref 11.5–15.5)
WBC: 5.6 10*3/uL (ref 4.0–10.5)

## 2015-08-28 LAB — URINALYSIS, ROUTINE W REFLEX MICROSCOPIC
Bilirubin Urine: NEGATIVE
Glucose, UA: NEGATIVE mg/dL
Hgb urine dipstick: NEGATIVE
KETONES UR: 15 mg/dL — AB
LEUKOCYTES UA: NEGATIVE
NITRITE: NEGATIVE
PROTEIN: NEGATIVE mg/dL
Specific Gravity, Urine: 1.026 (ref 1.005–1.030)
pH: 6 (ref 5.0–8.0)

## 2015-08-28 LAB — PREGNANCY, URINE: PREG TEST UR: NEGATIVE

## 2015-08-28 LAB — TROPONIN I: Troponin I: <0.03 ng/mL (ref <0.031)

## 2015-08-28 NOTE — ED Provider Notes (Signed)
CSN: [id number]     Arrival date & time 08/28/15  1923 History   First MD Initiated Contact with Patient 08/28/15 2306     Chief Complaint  Patient presents with  . Chest Pain     (Consider location/radiation/quality/duration/timing/severity/associated sxs/prior Treatment) HPI Comments: Patient with a history of Anxiety, Depression, PTSD, and GERD presents today with a chief complaint of chest pain.  She report onset of pain around 5 PM today approximately 30 minutes after eating.  Pain located right anterior chest and did not radiate.  She states that the pain lasted approximately 1.5 minutes and then resolved without intervention.  She states that the pain felt like a "burning" pain.  Pain not associated with exertion.  She denies chest pain at this time.  Denies SOB, cough, hemoptysis, LE edema, fever, chills, nausea, vomiting, or abdominal pain. She denies any cardiac history.  Denies history of PE or DVT.  The history is provided by the patient.    Past Medical History  Diagnosis Date  . Anemia   . High cholesterol   . Anxiety   . Depression   . Insomnia   . Palpitation   . GERD (gastroesophageal reflux disease)   . PTSD (post-traumatic stress disorder)   . Anxiety    Past Surgical History  Procedure Laterality Date  . Breast reduction surgery    . Gallstone removal    . Wisdom tooth extraction     No family history on file. Social History  Substance Use Topics  . Smoking status: Current Every Day Smoker -- 0.25 packs/day    Types: Cigarettes  . Smokeless tobacco: Never Used  . Alcohol Use: No   OB History    No data available     Review of Systems  All other systems reviewed and are negative.     Allergies  Aspirin and Contrast media  Home Medications   Prior to Admission medications   Medication Sig Start Date End Date Taking? Authorizing Provider  cholecalciferol (VITAMIN D) 1000 units tablet Take 1,000 Units by mouth daily.    Historical Provider, MD   diphenhydramine-acetaminophen (TYLENOL PM) 25-500 MG TABS tablet Take 1 tablet by mouth at bedtime as needed.    Historical Provider, MD  fluconazole (DIFLUCAN) 150 MG tablet Take 1 tablet (150 mg total) by mouth once. Take after you finish your antibiotics. 08/25/15   Roxy Horsemanobert Browning, PA-C  HYDROcodone-acetaminophen (NORCO/VICODIN) 5-325 MG tablet Take 2 tablets by mouth every 4 (four) hours as needed. 08/25/15   Roxy Horsemanobert Browning, PA-C  metroNIDAZOLE (FLAGYL) 500 MG tablet Take 1 tablet (500 mg total) by mouth 2 (two) times daily. 08/25/15   Roxy Horsemanobert Browning, PA-C  promethazine (PHENERGAN) 25 MG tablet Take 1 tablet (25 mg total) by mouth every 6 (six) hours as needed for nausea or vomiting. 08/16/15   Vanetta MuldersScott Zackowski, MD  tinidazole (TINDAMAX) 500 MG tablet Take 2 tablets (1,000 mg total) by mouth daily with breakfast. 07/26/15   Eyvonne MechanicJeffrey Hedges, PA-C   BP 113/69 mmHg  Pulse 68  Temp(Src) 97.9 F (36.6 C) (Oral)  Resp 18  Ht 5\' 3"  (1.6 m)  Wt 68.04 kg  BMI 26.58 kg/m2  SpO2 100%  LMP 08/10/2015 (Approximate) Physical Exam  Constitutional: She appears well-developed and well-nourished.  HENT:  Head: Normocephalic and atraumatic.  Mouth/Throat: Oropharynx is clear and moist.  Neck: Normal range of motion. Neck supple.  Cardiovascular: Normal rate, regular rhythm and normal heart sounds.   Pulmonary/Chest: Effort normal and breath sounds  normal.  Musculoskeletal: Normal range of motion.  Neurological: She is alert.  Skin: Skin is warm and dry.  Psychiatric: She has a normal mood and affect.  Nursing note and vitals reviewed.   ED Course  Procedures (including critical care time) Labs Review Labs Reviewed  URINALYSIS, ROUTINE W REFLEX MICROSCOPIC (NOT AT [facility]) - Abnormal; Notable for the following:    APPearance CLOUDY (*)    Ketones, ur 15 (*)    All other components within normal limits  PREGNANCY, URINE  BASIC METABOLIC PANEL  TROPONIN I  CBC  DIFFERENTIAL    Imaging Review Dg  Chest 2 View  08/28/2015  CLINICAL DATA:  Right-sided chest pain beginning this evening. Tingling in the left arm. EXAM: CHEST  2 VIEW COMPARISON:  08/16/2015 FINDINGS: The heart size and mediastinal contours are within normal limits. Both lungs are clear. The visualized skeletal structures are unremarkable. IMPRESSION: No active cardiopulmonary disease. Electronically Signed   By: Burman Nieves M.D.   On: 08/28/2015 22:22   I have personally reviewed and evaluated these images and lab results as part of my medical decision-making.   EKG Interpretation None      MDM   Final diagnoses:  None   Patient presents today with chest pain.  Onset of pain around 5 PM today approximately 30 minutes after eating.  Pain lasting 1.5 minutes and then resolved.  No pain at this time.  No ischemic changes on EKG.  Troponin negative.  CXR is negative.  Patient is PERC negative.  Feel that the patient is stable for discharge.  Return precautions given.    Santiago Glad, PA-C 08/29/15 0123  [name], MD 08/29/15 1610

## 2015-08-28 NOTE — ED Notes (Signed)
Pt reports today while sitting started having "prickling" in L arm and then with pain in right side of chest.

## 2015-08-29 MED ORDER — OMEPRAZOLE 20 MG PO CPDR: 20.0000 mg | DELAYED_RELEASE_CAPSULE | Freq: Every day | ORAL | Status: DC

## 2015-09-07 ENCOUNTER — Emergency Department (HOSPITAL_BASED_OUTPATIENT_CLINIC_OR_DEPARTMENT_OTHER): Payer: Self-pay

## 2015-09-07 ENCOUNTER — Encounter (HOSPITAL_BASED_OUTPATIENT_CLINIC_OR_DEPARTMENT_OTHER): Payer: Self-pay | Admitting: Emergency Medicine

## 2015-09-07 ENCOUNTER — Emergency Department (HOSPITAL_BASED_OUTPATIENT_CLINIC_OR_DEPARTMENT_OTHER)
Admission: EM | Admit: 2015-09-07 | Discharge: 2015-09-07 | Disposition: A | Discharge status: Home / Self Care | Payer: Self-pay | Attending: Emergency Medicine | Admitting: Emergency Medicine

## 2015-09-07 DIAGNOSIS — M549 Dorsalgia, unspecified: Secondary | ICD-10-CM

## 2015-09-07 DIAGNOSIS — D649 Anemia, unspecified: Secondary | ICD-10-CM | POA: Insufficient documentation

## 2015-09-07 DIAGNOSIS — Z79899 Other long term (current) drug therapy: Secondary | ICD-10-CM | POA: Insufficient documentation

## 2015-09-07 DIAGNOSIS — Z8669 Personal history of other diseases of the nervous system and sense organs: Secondary | ICD-10-CM | POA: Insufficient documentation

## 2015-09-07 DIAGNOSIS — R1011 Right upper quadrant pain: Secondary | ICD-10-CM | POA: Insufficient documentation

## 2015-09-07 DIAGNOSIS — M546 Pain in thoracic spine: Secondary | ICD-10-CM | POA: Insufficient documentation

## 2015-09-07 DIAGNOSIS — E7800 Pure hypercholesterolemia, unspecified: Secondary | ICD-10-CM | POA: Insufficient documentation

## 2015-09-07 DIAGNOSIS — Z8659 Personal history of other mental and behavioral disorders: Secondary | ICD-10-CM | POA: Insufficient documentation

## 2015-09-07 DIAGNOSIS — Z792 Long term (current) use of antibiotics: Secondary | ICD-10-CM | POA: Insufficient documentation

## 2015-09-07 DIAGNOSIS — K219 Gastro-esophageal reflux disease without esophagitis: Secondary | ICD-10-CM | POA: Insufficient documentation

## 2015-09-07 DIAGNOSIS — F17210 Nicotine dependence, cigarettes, uncomplicated: Secondary | ICD-10-CM | POA: Insufficient documentation

## 2015-09-07 LAB — CBC WITH DIFFERENTIAL/PLATELET
BASOS ABS: 0 10*3/uL (ref 0.0–0.1)
Basophils Relative: 0 %
Eosinophils Absolute: 0.3 10*3/uL (ref 0.0–0.7)
Eosinophils Relative: 5 %
HEMATOCRIT: 34.8 % — AB (ref 36.0–46.0)
Hemoglobin: 11.1 g/dL — ABNORMAL LOW (ref 12.0–15.0)
LYMPHS PCT: 53 %
Lymphs Abs: 2.8 10*3/uL (ref 0.7–4.0)
MCH: 24 pg — ABNORMAL LOW (ref 26.0–34.0)
MCHC: 31.9 g/dL (ref 30.0–36.0)
MCV: 75.3 fL — AB (ref 78.0–100.0)
Monocytes Absolute: 0.5 10*3/uL (ref 0.1–1.0)
Monocytes Relative: 8 %
NEUTROS ABS: 1.8 10*3/uL (ref 1.7–7.7)
Neutrophils Relative %: 34 %
PLATELETS: 257 10*3/uL (ref 150–400)
RBC: 4.62 MIL/uL (ref 3.87–5.11)
RDW: 14.9 % (ref 11.5–15.5)
WBC: 5.3 10*3/uL (ref 4.0–10.5)

## 2015-09-07 LAB — URINALYSIS, ROUTINE W REFLEX MICROSCOPIC
BILIRUBIN URINE: NEGATIVE
GLUCOSE, UA: NEGATIVE mg/dL
HGB URINE DIPSTICK: NEGATIVE
Ketones, ur: 15 mg/dL — AB
Leukocytes, UA: NEGATIVE
Nitrite: NEGATIVE
Protein, ur: NEGATIVE mg/dL
SPECIFIC GRAVITY, URINE: 1.028 (ref 1.005–1.030)
pH: 5.5 (ref 5.0–8.0)

## 2015-09-07 LAB — COMPREHENSIVE METABOLIC PANEL
ALBUMIN: 4.3 g/dL (ref 3.5–5.0)
ALT: 13 U/L — AB (ref 14–54)
AST: 15 U/L (ref 15–41)
Alkaline Phosphatase: 43 U/L (ref 38–126)
Anion gap: 8 (ref 5–15)
BILIRUBIN TOTAL: 1.3 mg/dL — AB (ref 0.3–1.2)
BUN: 10 mg/dL (ref 6–20)
CHLORIDE: 104 mmol/L (ref 101–111)
CO2: 25 mmol/L (ref 22–32)
CREATININE: 0.64 mg/dL (ref 0.44–1.00)
Calcium: 9 mg/dL (ref 8.9–10.3)
GFR calc Af Amer: >60 mL/min (ref >60)
GLUCOSE: 91 mg/dL (ref 65–99)
Potassium: 3.5 mmol/L (ref 3.5–5.1)
Sodium: 137 mmol/L (ref 135–145)
Total Protein: 7.4 g/dL (ref 6.5–8.1)

## 2015-09-07 LAB — LIPASE, BLOOD: Lipase: 28 U/L (ref 11–51)

## 2015-09-07 LAB — TROPONIN I: Troponin I: <0.03 ng/mL (ref <0.031)

## 2015-09-07 MED ORDER — METHOCARBAMOL 500 MG PO TABS: 500.0000 mg | ORAL_TABLET | Freq: Three times a day (TID) | ORAL | Status: DC | PRN

## 2015-09-07 NOTE — ED Notes (Signed)
Pt also states increased urinary frequency and constipation for past week.  Last BM today but states "it was really small".  Pt recently started on iron supplements.

## 2015-09-07 NOTE — ED Notes (Signed)
C/o mid to lower back pain x3 days denies injury or trauma. ambulatory at triage with steady gait.

## 2015-09-07 NOTE — ED Provider Notes (Signed)
CSN: [id number]     Arrival date & time 09/07/15  1523 History   First MD Initiated Contact with Patient 09/07/15 1618     Chief Complaint  Patient presents with  . Back Pain     Patient is a 35 y.o. female presenting with back pain. The history is provided by the patient. No language interpreter was used.  Back Pain  Patricia Wilcox is a 35 y.o. female who presents to the Emergency Department complaining of back pain.  She reports 2.5 days of mid thoracic back pain that is waxing and waning in nature.  It migrates to different regions including left axilla, right upper back, left leg.  The pain is described as burning in nature with no clear triggers.  When the pain is present she is unable to lay on her back or side.  She has associated intermittent abdominal pain (central, RUQ).  She denies fever, sob, nausea, vomiting, dysuria, leg swelling.   Past Medical History  Diagnosis Date  . Anemia   . High cholesterol   . Anxiety   . Depression   . Insomnia   . Palpitation   . GERD (gastroesophageal reflux disease)   . PTSD (post-traumatic stress disorder)   . Anxiety    Past Surgical History  Procedure Laterality Date  . Breast reduction surgery    . Gallstone removal    . Wisdom tooth extraction     No family history on file. Social History  Substance Use Topics  . Smoking status: Current Every Day Smoker -- 0.25 packs/day    Types: Cigarettes  . Smokeless tobacco: Never Used  . Alcohol Use: No   OB History    No data available     Review of Systems  Musculoskeletal: Positive for back pain.  All other systems reviewed and are negative.     Allergies  Aspirin and Contrast media  Home Medications   Prior to Admission medications   Medication Sig Start Date End Date Taking? Authorizing Provider  ferrous sulfate 325 (65 FE) MG tablet Take 325 mg by mouth daily with breakfast.   Yes Historical Provider, MD  cholecalciferol (VITAMIN D) 1000 units tablet Take 1,000  Units by mouth daily.    Historical Provider, MD  diphenhydramine-acetaminophen (TYLENOL PM) 25-500 MG TABS tablet Take 1 tablet by mouth at bedtime as needed.    Historical Provider, MD  fluconazole (DIFLUCAN) 150 MG tablet Take 1 tablet (150 mg total) by mouth once. Take after you finish your antibiotics. 08/25/15   Roxy Horsemanobert Browning, PA-C  HYDROcodone-acetaminophen (NORCO/VICODIN) 5-325 MG tablet Take 2 tablets by mouth every 4 (four) hours as needed. 08/25/15   Roxy Horsemanobert Browning, PA-C  methocarbamol (ROBAXIN) 500 MG tablet Take 1 tablet (500 mg total) by mouth every 8 (eight) hours as needed for muscle spasms. 09/07/15   Patricia FossaElizabeth Rambo Sarafian, MD  metroNIDAZOLE (FLAGYL) 500 MG tablet Take 1 tablet (500 mg total) by mouth 2 (two) times daily. 08/25/15   Roxy Horsemanobert Browning, PA-C  omeprazole (PRILOSEC) 20 MG capsule Take 1 capsule (20 mg total) by mouth daily. 08/29/15   Heather Laisure, PA-C  promethazine (PHENERGAN) 25 MG tablet Take 1 tablet (25 mg total) by mouth every 6 (six) hours as needed for nausea or vomiting. 08/16/15   Vanetta MuldersScott Zackowski, MD  tinidazole (TINDAMAX) 500 MG tablet Take 2 tablets (1,000 mg total) by mouth daily with breakfast. 07/26/15   Eyvonne MechanicJeffrey Hedges, PA-C   BP 137/77 mmHg  Pulse 57  Temp(Src) 98.1 F (  36.7 C) (Oral)  Resp 18  Ht  (1.6 m)  Wt 145 lb (65.772 kg)  BMI 25.69 kg/m2  SpO2 100%  LMP 09/06/2015 Physical Exam  Constitutional: She is oriented to person, place, and time. She appears well-developed and well-nourished.  HENT:  Head: Normocephalic and atraumatic.  Cardiovascular: Normal rate and regular rhythm.   No murmur heard. Pulmonary/Chest: Effort normal and breath sounds normal. No respiratory distress.  Abdominal: Soft. There is no tenderness. There is no rebound and no guarding.  Musculoskeletal: She exhibits no edema or tenderness.  5/5 strength in BLE.  No C/T/L spine tenderness to palpation.  No cva tenderness.   Neurological: She is alert and oriented to person,  place, and time.  Skin: Skin is warm and dry. No rash noted.  Psychiatric: She has a normal mood and affect. Her behavior is normal.  Nursing note and vitals reviewed.   ED Course  Procedures (including critical care time) Labs Review Labs Reviewed  CBC WITH DIFFERENTIAL/PLATELET - Abnormal; Notable for the following:    Hemoglobin 11.1 (*)    HCT 34.8 (*)    MCV 75.3 (*)    MCH 24.0 (*)    All other components within normal limits  COMPREHENSIVE METABOLIC PANEL - Abnormal; Notable for the following:    ALT 13 (*)    Total Bilirubin 1.3 (*)    All other components within normal limits  URINALYSIS, ROUTINE W REFLEX MICROSCOPIC (NOT AT [facility]) - Abnormal; Notable for the following:    Ketones, ur 15 (*)    All other components within normal limits  LIPASE, BLOOD  TROPONIN I    Imaging Review Dg Chest 2 View  09/07/2015  CLINICAL DATA:  Chest pain for 2 days.  Palpitations. EXAM: CHEST  2 VIEW COMPARISON:  08/28/2015 FINDINGS: The heart size and mediastinal contours are within normal limits. Both lungs are clear. The visualized skeletal structures are unremarkable. IMPRESSION: Normal chest x-ray. Electronically Signed   By: Rudie Meyer M.D.   On: 09/07/2015 17:56   I have personally reviewed and evaluated these images and lab results as part of my medical decision-making.   EKG Interpretation   Date/Time:  Thursday September 07 2015 16:16:52 EDT Ventricular Rate:  55 PR Interval:  166 QRS Duration: 96 QT Interval:  403 QTC Calculation: 385 R Axis:   74 Text Interpretation:  Sinus rhythm Confirmed by Lincoln Brigham [phone]) on  09/07/2015 4:21:46 PM      MDM   Final diagnoses:  Acute back pain   Patient here for evaluation of back pain. She is neurovascularly intact on exam with benign abdominal examination. Presentation is not consistent with ACS, PE, dissection. Offerred patient reassurance. Discussed outpatient follow-up, home care, return precautions.    Patricia Fossa,  MD 09/07/15 2356

## 2015-09-07 NOTE — ED Notes (Signed)
Pt states midd/upper back pain, central, but started initially on left side radiating to back.  Pain not reciprocated with palpation.  Pt c/o left arm and leg pain at time of initial back pain and states today and since start of pain 3 days ago, feels chest "pressure."

## 2015-09-07 NOTE — Discharge Instructions (Signed)

## 2015-09-16 ENCOUNTER — Encounter (HOSPITAL_BASED_OUTPATIENT_CLINIC_OR_DEPARTMENT_OTHER): Payer: Self-pay

## 2015-09-16 ENCOUNTER — Emergency Department (HOSPITAL_BASED_OUTPATIENT_CLINIC_OR_DEPARTMENT_OTHER)
Admission: EM | Admit: 2015-09-16 | Discharge: 2015-09-16 | Disposition: A | Discharge status: Home / Self Care | Payer: Self-pay | Attending: Emergency Medicine | Admitting: Emergency Medicine

## 2015-09-16 DIAGNOSIS — R29898 Other symptoms and signs involving the musculoskeletal system: Secondary | ICD-10-CM

## 2015-09-16 DIAGNOSIS — Z87891 Personal history of nicotine dependence: Secondary | ICD-10-CM | POA: Insufficient documentation

## 2015-09-16 DIAGNOSIS — D649 Anemia, unspecified: Secondary | ICD-10-CM | POA: Insufficient documentation

## 2015-09-16 DIAGNOSIS — Z8659 Personal history of other mental and behavioral disorders: Secondary | ICD-10-CM | POA: Insufficient documentation

## 2015-09-16 DIAGNOSIS — Z8639 Personal history of other endocrine, nutritional and metabolic disease: Secondary | ICD-10-CM | POA: Insufficient documentation

## 2015-09-16 DIAGNOSIS — K219 Gastro-esophageal reflux disease without esophagitis: Secondary | ICD-10-CM | POA: Insufficient documentation

## 2015-09-16 DIAGNOSIS — Z792 Long term (current) use of antibiotics: Secondary | ICD-10-CM | POA: Insufficient documentation

## 2015-09-16 DIAGNOSIS — M79604 Pain in right leg: Secondary | ICD-10-CM | POA: Insufficient documentation

## 2015-09-16 DIAGNOSIS — M6281 Muscle weakness (generalized): Secondary | ICD-10-CM | POA: Insufficient documentation

## 2015-09-16 DIAGNOSIS — Z79899 Other long term (current) drug therapy: Secondary | ICD-10-CM | POA: Insufficient documentation

## 2015-09-16 DIAGNOSIS — Z8669 Personal history of other diseases of the nervous system and sense organs: Secondary | ICD-10-CM | POA: Insufficient documentation

## 2015-09-16 NOTE — ED Provider Notes (Signed)
CSN: [id number]     Arrival date & time 09/16/15  1823 History   First MD Initiated Contact with Patient 09/16/15 1916     Chief Complaint  Patient presents with  . Leg cramps       HPI   35 year old female presents today with leg pain. Patient reports that beginning yesterday she developed slow onset bilateral leg pain. She describes this as crampy in nature, worse with ambulation, nonfocal. Patient reports that 3 weeks ago she was diagnosed with lower back pain was taken out of work and has been laying in bed and only getting up periodically. She notes progressive weakness to her lower extremities, denies any focal neurologic deficits, bowel or bladder dysfunction. Patient reports she's additionally been taking muscle relaxers on a daily basis. Patient denies any fever, chills, nausea, vomiting, abdominal pain, reports that she is eating and drinking appropriately, denies any urinary complaints. She denies any weakness in any other area of her body.   Past Medical History  Diagnosis Date  . Anemia   . High cholesterol   . Anxiety   . Depression   . Insomnia   . Palpitation   . GERD (gastroesophageal reflux disease)   . PTSD (post-traumatic stress disorder)   . Anxiety    Past Surgical History  Procedure Laterality Date  . Breast reduction surgery    . Gallstone removal    . Wisdom tooth extraction     History reviewed. No pertinent family history. Social History  Substance Use Topics  . Smoking status: Former Smoker -- 0.25 packs/day    Types: Cigarettes  . Smokeless tobacco: Never Used  . Alcohol Use: No   OB History    No data available     Review of Systems  All other systems reviewed and are negative.   Allergies  Aspirin and Contrast media  Home Medications   Prior to Admission medications   Medication Sig Start Date End Date Taking? Authorizing Provider  cholecalciferol (VITAMIN D) 1000 units tablet Take 1,000 Units by mouth daily.   Yes Historical  Provider, MD  ferrous sulfate 325 (65 FE) MG tablet Take 325 mg by mouth daily with breakfast.   Yes Historical Provider, MD  methocarbamol (ROBAXIN) 500 MG tablet Take 1 tablet (500 mg total) by mouth every 8 (eight) hours as needed for muscle spasms. 09/07/15  Yes Tilden Fossa, MD  diphenhydramine-acetaminophen (TYLENOL PM) 25-500 MG TABS tablet Take 1 tablet by mouth at bedtime as needed.    Historical Provider, MD  fluconazole (DIFLUCAN) 150 MG tablet Take 1 tablet (150 mg total) by mouth once. Take after you finish your antibiotics. 08/25/15   Roxy Horseman, PA-C  HYDROcodone-acetaminophen (NORCO/VICODIN) 5-325 MG tablet Take 2 tablets by mouth every 4 (four) hours as needed. 08/25/15   Roxy Horseman, PA-C  metroNIDAZOLE (FLAGYL) 500 MG tablet Take 1 tablet (500 mg total) by mouth 2 (two) times daily. 08/25/15   Roxy Horseman, PA-C  omeprazole (PRILOSEC) 20 MG capsule Take 1 capsule (20 mg total) by mouth daily. 08/29/15   Heather Laisure, PA-C  promethazine (PHENERGAN) 25 MG tablet Take 1 tablet (25 mg total) by mouth every 6 (six) hours as needed for nausea or vomiting. 08/16/15   Vanetta Mulders, MD  tinidazole (TINDAMAX) 500 MG tablet Take 2 tablets (1,000 mg total) by mouth daily with breakfast. 07/26/15   Eyvonne Mechanic, PA-C   BP 121/74 mmHg  Pulse 78  Temp(Src) 98.2 F (36.8 C) (Oral)  Resp 15  Ht 5\' 3"  (1.6 m)  Wt 68.04 kg  BMI 26.58 kg/m2  SpO2 100%  LMP 09/06/2015   Physical Exam  Constitutional: She is oriented to person, place, and time. She appears well-developed and well-nourished.  HENT:  Head: Normocephalic and atraumatic.  Eyes: Conjunctivae are normal. Pupils are equal, round, and reactive to light. Right eye exhibits no discharge. Left eye exhibits no discharge. No scleral icterus.  Neck: Normal range of motion. No JVD present. No tracheal deviation present.  Pulmonary/Chest: Effort normal. No stridor.  Musculoskeletal:  Location is equal bilateral, no signs of  trauma, swelling, redness, warmth to touch. Full active range of motion, strength 5 out of 5, joints supple with no infectious etiology. Sensation grossly intact.  No CT or L-spine tenderness to palpation, very minimal tenderness to palpation of the left lower sacral region. No abnormalities noted on external inspection  Neurological: She is alert and oriented to person, place, and time. Coordination normal.  Skin: Skin is warm and dry. No rash noted. No erythema. No pallor.  Psychiatric: She has a normal mood and affect. Her behavior is normal. Judgment and thought content normal.  Nursing note and vitals reviewed.   ED Course  Procedures (including critical care time) Labs Review Labs Reviewed - No data to display  Imaging Review No results found. I have personally reviewed and evaluated these images and lab results as part of my medical decision-making.   EKG Interpretation None      MDM   Final diagnoses:  Weakness of both lower extremities    Labs:  Imaging:  Consults:  Therapeutics:  Discharge Meds:   Assessment/Plan: 35 year old female presents today with leg pain. Patient's pain is likely due to deconditioning. She was diagnosed with back pain has been laying in bed take muscle relaxers and not ambulating regularly. She has no swelling or edema that would indicate DVT, she has no infectious etiology, and has no neurological deficits. Patient will be instructed to ambulate, exercise, discontinue using muscle relaxers, monitor for any new or worsening signs or symptoms to return if any present. She is instructed follow-up with her primary care provider for reevaluation of symptoms continue to persist beyond one week. Patient verbalized understanding and agreement today's plan had no further questions or concerns discharge         Eyvonne MechanicJeffrey Kamrin Sibley, PA-C 09/16/15 2013  Nelva Nayobert Beaton, MD 09/20/15 [phone]

## 2015-09-16 NOTE — ED Notes (Signed)
Pt reports 2 day history of bilateral leg cramps, intermittent numbness, radiation "up to back." Pt able to ambulate with steady gait.

## 2015-09-25 ENCOUNTER — Emergency Department (HOSPITAL_BASED_OUTPATIENT_CLINIC_OR_DEPARTMENT_OTHER)
Admission: EM | Admit: 2015-09-25 | Discharge: 2015-09-25 | Disposition: A | Discharge status: Home / Self Care | Payer: Self-pay | Attending: Emergency Medicine | Admitting: Emergency Medicine

## 2015-09-25 ENCOUNTER — Emergency Department (HOSPITAL_BASED_OUTPATIENT_CLINIC_OR_DEPARTMENT_OTHER): Payer: Self-pay

## 2015-09-25 ENCOUNTER — Encounter (HOSPITAL_BASED_OUTPATIENT_CLINIC_OR_DEPARTMENT_OTHER): Payer: Self-pay | Admitting: Emergency Medicine

## 2015-09-25 ENCOUNTER — Other Ambulatory Visit: Payer: Self-pay

## 2015-09-25 DIAGNOSIS — Z87891 Personal history of nicotine dependence: Secondary | ICD-10-CM | POA: Insufficient documentation

## 2015-09-25 DIAGNOSIS — Z792 Long term (current) use of antibiotics: Secondary | ICD-10-CM | POA: Insufficient documentation

## 2015-09-25 DIAGNOSIS — Z79899 Other long term (current) drug therapy: Secondary | ICD-10-CM | POA: Insufficient documentation

## 2015-09-25 DIAGNOSIS — K5900 Constipation, unspecified: Secondary | ICD-10-CM | POA: Insufficient documentation

## 2015-09-25 DIAGNOSIS — Z8639 Personal history of other endocrine, nutritional and metabolic disease: Secondary | ICD-10-CM | POA: Insufficient documentation

## 2015-09-25 DIAGNOSIS — D649 Anemia, unspecified: Secondary | ICD-10-CM | POA: Insufficient documentation

## 2015-09-25 DIAGNOSIS — R079 Chest pain, unspecified: Secondary | ICD-10-CM | POA: Insufficient documentation

## 2015-09-25 DIAGNOSIS — Z8659 Personal history of other mental and behavioral disorders: Secondary | ICD-10-CM | POA: Insufficient documentation

## 2015-09-25 DIAGNOSIS — R1013 Epigastric pain: Secondary | ICD-10-CM | POA: Insufficient documentation

## 2015-09-25 DIAGNOSIS — K219 Gastro-esophageal reflux disease without esophagitis: Secondary | ICD-10-CM | POA: Insufficient documentation

## 2015-09-25 DIAGNOSIS — Z8669 Personal history of other diseases of the nervous system and sense organs: Secondary | ICD-10-CM | POA: Insufficient documentation

## 2015-09-25 LAB — BASIC METABOLIC PANEL
ANION GAP: 6 (ref 5–15)
BUN: 13 mg/dL (ref 6–20)
CALCIUM: 9.3 mg/dL (ref 8.9–10.3)
CO2: 24 mmol/L (ref 22–32)
Chloride: 106 mmol/L (ref 101–111)
Creatinine, Ser: 0.82 mg/dL (ref 0.44–1.00)
GLUCOSE: 85 mg/dL (ref 65–99)
Potassium: 4 mmol/L (ref 3.5–5.1)
Sodium: 136 mmol/L (ref 135–145)

## 2015-09-25 LAB — HEPATIC FUNCTION PANEL
ALBUMIN: 4.1 g/dL (ref 3.5–5.0)
ALK PHOS: 58 U/L (ref 38–126)
ALT: 33 U/L (ref 14–54)
AST: 20 U/L (ref 15–41)
BILIRUBIN INDIRECT: 0.8 mg/dL (ref 0.3–0.9)
Bilirubin, Direct: 0.1 mg/dL (ref 0.1–0.5)
TOTAL PROTEIN: 7.5 g/dL (ref 6.5–8.1)
Total Bilirubin: 0.9 mg/dL (ref 0.3–1.2)

## 2015-09-25 LAB — CBC
HEMATOCRIT: 35.9 % — AB (ref 36.0–46.0)
HEMOGLOBIN: 11.4 g/dL — AB (ref 12.0–15.0)
MCH: 23.8 pg — ABNORMAL LOW (ref 26.0–34.0)
MCHC: 31.8 g/dL (ref 30.0–36.0)
MCV: 74.8 fL — ABNORMAL LOW (ref 78.0–100.0)
Platelets: 244 10*3/uL (ref 150–400)
RBC: 4.8 MIL/uL (ref 3.87–5.11)
RDW: 14.7 % (ref 11.5–15.5)
WBC: 5.3 10*3/uL (ref 4.0–10.5)

## 2015-09-25 LAB — TROPONIN I

## 2015-09-25 LAB — LIPASE, BLOOD: Lipase: 23 U/L (ref 11–51)

## 2015-09-25 MED ORDER — GI COCKTAIL ~~LOC~~
30.0000 mL | Freq: Once | ORAL | Status: AC
  Administered 2015-09-25: 30 mL via ORAL
  Filled 2015-09-25: qty 30

## 2015-09-25 MED ORDER — METOCLOPRAMIDE HCL 10 MG PO TABS: 10.0000 mg | ORAL_TABLET | Freq: Four times a day (QID) | ORAL | Status: DC

## 2015-09-25 MED ORDER — SUCRALFATE 1 GM/10ML PO SUSP: 1.0000 g | Freq: Three times a day (TID) | ORAL | Status: DC

## 2015-09-25 MED ORDER — FAMOTIDINE 20 MG PO TABS
20.0000 mg | ORAL_TABLET | Freq: Once | ORAL | Status: AC
  Administered 2015-09-25: 20 mg via ORAL
  Filled 2015-09-25: qty 1

## 2015-09-25 NOTE — Discharge Instructions (Signed)
Possible Peptic Ulcer You may have an ulcer or a hiatal hernia. You will need to be seen by your gastroenterologist in proceed with your upper endoscopy to diagnose these conditions. You are started on medications to help with the symptoms and treatment of these conditions. Take the Prilosec that you were prescribed daily. Also take Reglan and Carafate as prescribed. A peptic ulcer is a sore in the lining of your esophagus (esophageal ulcer), stomach (gastric ulcer), or in the first part of your small intestine (duodenal ulcer). The ulcer causes erosion into the deeper tissue. CAUSES  Normally, the lining of the stomach and the small intestine protects itself from the acid that digests food. The protective lining can be damaged by:  An infection caused by a bacterium called Helicobacter pylori (H. pylori).  Regular use of nonsteroidal anti-inflammatory drugs (NSAIDs), such as ibuprofen or aspirin.  Smoking tobacco. Other risk factors include being older than 50, drinking alcohol excessively, and having a family history of ulcer disease.  SYMPTOMS   Burning pain or gnawing in the area between the chest and the belly button.  Heartburn.  Nausea and vomiting.  Bloating. The pain can be worse on an empty stomach and at night. If the ulcer results in bleeding, it can cause:  Black, tarry stools.  Vomiting of bright red blood.  Vomiting of coffee-ground-looking materials. DIAGNOSIS  A diagnosis is usually made based upon your history and an exam. Other tests and procedures may be performed to find the cause of the ulcer. Finding a cause will help determine the best treatment. Tests and procedures may include:  Blood tests, stool tests, or breath tests to check for the bacterium H. pylori.  An upper gastrointestinal (GI) series of the esophagus, stomach, and small intestine.  An endoscopy to examine the esophagus, stomach, and small intestine.  A biopsy. TREATMENT  Treatment may  include:  Eliminating the cause of the ulcer, such as smoking, NSAIDs, or alcohol.  Medicines to reduce the amount of acid in your digestive tract.  Antibiotic medicines if the ulcer is caused by the H. pylori bacterium.  An upper endoscopy to treat a bleeding ulcer.  Surgery if the bleeding is severe or if the ulcer created a hole somewhere in the digestive system. HOME CARE INSTRUCTIONS   Avoid tobacco, alcohol, and caffeine. Smoking can increase the acid in the stomach, and continued smoking will impair the healing of ulcers.  Avoid foods and drinks that seem to cause discomfort or aggravate your ulcer.  Only take medicines as directed by your caregiver. Do not substitute over-the-counter medicines for prescription medicines without talking to your caregiver.  Keep any follow-up appointments and tests as directed. SEEK MEDICAL CARE IF:   Your do not improve within 7 days of starting treatment.  You have ongoing indigestion or heartburn. SEEK IMMEDIATE MEDICAL CARE IF:   You have sudden, sharp, or persistent abdominal pain.  You have bloody or dark black, tarry stools.  You vomit blood or vomit that looks like coffee grounds.  You become light-headed, weak, or feel faint.  You become sweaty or clammy. MAKE SURE YOU:   Understand these instructions.  Will watch your condition.  Will get help right away if you are not doing well or get worse.   This information is not intended to replace advice given to you by your health care provider. Make sure you discuss any questions you have with your health care provider.   Document Released: 06/07/2000 Document Revised: 07/01/2014  Document Reviewed: 01/08/2012 Elsevier Interactive Patient Education 2016 [employer] Possible Hiatal Hernia A hiatal hernia occurs when part of your stomach slides above the muscle that separates your abdomen from your chest (diaphragm). You can be born with a hiatal hernia (congenital), or it  may develop over time. In almost all cases of hiatal hernia, only the top part of the stomach pushes through.  Many people have a hiatal hernia with no symptoms. The larger the hernia, the more likely that you will have symptoms. In some cases, a hiatal hernia allows stomach acid to flow back into the tube that carries food from your mouth to your stomach (esophagus). This may cause heartburn symptoms. Severe heartburn symptoms may mean you have developed a condition called gastroesophageal reflux disease (GERD).  CAUSES  Hiatal hernias are caused by a weakness in the opening (hiatus) where your esophagus passes through your diaphragm to attach to the upper part of your stomach. You may be born with a weakness in your hiatus, or a weakness can develop. RISK FACTORS Older age is a major risk factor for a hiatal hernia. Anything that increases pressure on your diaphragm can also increase your risk of a hiatal hernia. This includes:  Pregnancy.  Excess weight.  Frequent constipation. SIGNS AND SYMPTOMS  People with a hiatal hernia often have no symptoms. If symptoms develop, they are almost always caused by GERD. They may include:  Heartburn.  Belching.  Indigestion.  Trouble swallowing.  Coughing or wheezing.  Sore throat.  Hoarseness.  Chest pain. DIAGNOSIS  A hiatal hernia is sometimes found during an exam for another problem. Your health care provider may suspect a hiatal hernia if you have symptoms of GERD. Tests may be done to diagnose GERD. These may include:  X-rays of your stomach or chest.  An upper gastrointestinal (GI) series. This is an X-ray exam of your GI tract involving the use of a chalky liquid that you swallow. The liquid shows up clearly on the X-ray.  Endoscopy. This is a procedure to look into your stomach using a thin, flexible tube that has a tiny camera and light on the end of it. TREATMENT  If you have no symptoms, you may not need treatment. If you  have symptoms, treatment may include:  Dietary and lifestyle changes to help reduce GERD symptoms.  Medicines. These may include:  Over-the-counter antacids.  Medicines that make your stomach empty more quickly.  Medicines that block the production of stomach acid (H2 blockers).  Stronger medicines to reduce stomach acid (proton pump inhibitors).  You may need surgery to repair the hernia if other treatments are not helping. HOME CARE INSTRUCTIONS   Take all medicines as directed by your health care provider.  Quit smoking, if you smoke.  Try to achieve and maintain a healthy body weight.  Eat frequent small meals instead of three large meals a day. This keeps your stomach from getting too full.  Eat slowly.  Do not lie down right after eating.  Do noteat 1-2 hours before bed.   Do not drink beverages with caffeine. These include cola, coffee, cocoa, and tea.  Do not drink alcohol.  Avoid foods that can make symptoms of GERD worse. These may include:  Fatty foods.  Citrus fruits.  Other foods and drinks that contain acid.  Avoid putting pressure on your belly. Anything that puts pressure on your belly increases the amount of acid that may be pushed up into your esophagus.   Avoid  bending over, especially after eating.  Raise the head of your bed by putting blocks under the legs. This keeps your head and esophagus higher than your stomach.  Do not wear tight clothing around your chest or stomach.  Try not to strain when having a bowel movement, when urinating, or when lifting heavy objects. SEEK MEDICAL CARE IF:  Your symptoms are not controlled with medicines or lifestyle changes.  You are having trouble swallowing.  You have coughing or wheezing that will not go away. SEEK IMMEDIATE MEDICAL CARE IF:  Your pain is getting worse.  Your pain spreads to your arms, neck, jaw, teeth, or back.  You have shortness of breath.  You sweat for no  reason.  You feel sick to your stomach (nauseous) or vomit.  You vomit blood.  You have bright red blood in your stools.  You have black, tarry stools.    This information is not intended to replace advice given to you by your health care provider. Make sure you discuss any questions you have with your health care provider.   Document Released: 08/31/2003 Document Revised: 07/01/2014 Document Reviewed: 05/28/2013 Elsevier Interactive Patient Education [employer]

## 2015-09-25 NOTE — ED Notes (Signed)
States has pain in abd that radiates to back and chest

## 2015-09-25 NOTE — ED Provider Notes (Signed)
CSN: [id number]     Arrival date & time 09/25/15  1703 History  By signing my name below, I, Patricia Wilcox, attest that this documentation has been prepared under the direction and in the presence of Arby Barrette, MD. Electronically Signed: Linus Wilcox, ED Scribe. 09/25/2015. 6:56 PM.   Chief Complaint  Patient presents with  . Abdominal Pain   The history is provided by the patient. No language interpreter was used.   HPI Comments: Patricia Wilcox is a 35 y.o. female who presents to the Emergency Department with a PMHx of GERD, and HLD complaining of Epigastric pain that began earlier today.. Pt reports the pain radiates to her back and abdomen and chest. Pt rates her pain a "20" out of 10 when it started.. Pt also reports constipation. She states she went 10 days before having a small BM 1 day ago. Patient reports that she was tried some really hot prune juice for constipation and that's when the severe epigastric pain began. Pt was prescribed Prilosec 4 days ago, she has taken 2 doses. Patient has been having ongoing and intermittent problems with epigastric pain. She is scheduled to get both in upper and lower endoscopy within the next several weeks. Pt denies any SOB, nausea, vomiting, diarrhea, leg swelling or any other symptoms a this time.   [facility] suspicious for hiatal hernia.  Pt has a endoscopy scheduled on 10/09/15  Past Medical History  Diagnosis Date  . Anemia   . High cholesterol   . Anxiety   . Depression   . Insomnia   . Palpitation   . GERD (gastroesophageal reflux disease)   . PTSD (post-traumatic stress disorder)   . Anxiety    Past Surgical History  Procedure Laterality Date  . Breast reduction surgery    . Gallstone removal    . Wisdom tooth extraction     History reviewed. No pertinent family history. Social History  Substance Use Topics  . Smoking status: Former Smoker -- 0.25 packs/day    Types: Cigarettes  . Smokeless tobacco: Never  Used  . Alcohol Use: No   OB History    No data available     Review of Systems  Respiratory: Negative for shortness of breath.   Cardiovascular: Positive for chest pain. Negative for leg swelling.  Gastrointestinal: Positive for abdominal pain and constipation. Negative for nausea, vomiting and diarrhea.  Musculoskeletal: Negative for back pain.  All other systems reviewed and are negative.  Allergies  Aspirin and Contrast media  Home Medications   Prior to Admission medications   Medication Sig Start Date End Date Taking? Authorizing Provider  cholecalciferol (VITAMIN D) 1000 units tablet Take 1,000 Units by mouth daily.    Historical Provider, MD  diphenhydramine-acetaminophen (TYLENOL PM) 25-500 MG TABS tablet Take 1 tablet by mouth at bedtime as needed.    Historical Provider, MD  ferrous sulfate 325 (65 FE) MG tablet Take 325 mg by mouth daily with breakfast.    Historical Provider, MD  fluconazole (DIFLUCAN) 150 MG tablet Take 1 tablet (150 mg total) by mouth once. Take after you finish your antibiotics. 08/25/15   Roxy Horseman, PA-C  HYDROcodone-acetaminophen (NORCO/VICODIN) 5-325 MG tablet Take 2 tablets by mouth every 4 (four) hours as needed. 08/25/15   Roxy Horseman, PA-C  methocarbamol (ROBAXIN) 500 MG tablet Take 1 tablet (500 mg total) by mouth every 8 (eight) hours as needed for muscle spasms. 09/07/15   Tilden Fossa, MD  metoCLOPramide (REGLAN) 10  MG tablet Take 1 tablet (10 mg total) by mouth every 6 (six) hours. 09/25/15   Arby BarretteMarcy Ramadan Couey, MD  metroNIDAZOLE (FLAGYL) 500 MG tablet Take 1 tablet (500 mg total) by mouth 2 (two) times daily. 08/25/15   Roxy Horsemanobert Browning, PA-C  omeprazole (PRILOSEC) 20 MG capsule Take 1 capsule (20 mg total) by mouth daily. 08/29/15   Heather Laisure, PA-C  promethazine (PHENERGAN) 25 MG tablet Take 1 tablet (25 mg total) by mouth every 6 (six) hours as needed for nausea or vomiting. 08/16/15   Vanetta MuldersScott Zackowski, MD  sucralfate (CARAFATE) 1 GM/10ML  suspension Take 10 mLs (1 g total) by mouth 4 (four) times daily -  with meals and at bedtime. 09/25/15   Arby BarretteMarcy Collin Rengel, MD  tinidazole (TINDAMAX) 500 MG tablet Take 2 tablets (1,000 mg total) by mouth daily with breakfast. 07/26/15   Eyvonne MechanicJeffrey Hedges, PA-C   BP 109/76 mmHg  Pulse 58  Temp(Src)   Resp 26  SpO2 100%  LMP 09/06/2015   Physical Exam  Constitutional: She is oriented to person, place, and time. She appears well-developed and well-nourished.  HENT:  Head: Normocephalic and atraumatic.  Mouth/Throat: Oropharynx is clear and moist and mucous membranes are normal.  Neck: Normal range of motion. Neck supple.  Cardiovascular: Normal rate, regular rhythm, normal heart sounds and intact distal pulses.   Pulmonary/Chest: Effort normal and breath sounds normal. No respiratory distress.  Abdominal: Soft. Bowel sounds are normal. She exhibits no distension. There is no guarding.  Moderate epigastric tenderness  Musculoskeletal: Normal range of motion. She exhibits no edema or tenderness.  Neurological: She is alert and oriented to person, place, and time. She exhibits normal muscle tone. Coordination normal.  Skin: Skin is warm and dry.  Psychiatric: She has a normal mood and affect.  Nursing note and vitals reviewed.  ED Course  Procedures   DIAGNOSTIC STUDIES: Oxygen Saturation is 100% on room air, normal by my interpretation.    COORDINATION OF CARE: 6:48 PM Will order CXR, and blood work. Discussed treatment plan with pt at bedside and pt agreed to plan.   Labs Review Labs Reviewed  CBC - Abnormal; Notable for the following:    Hemoglobin 11.4 (*)    HCT 35.9 (*)    MCV 74.8 (*)    MCH 23.8 (*)    All other components within normal limits  BASIC METABOLIC PANEL  TROPONIN I  HEPATIC FUNCTION PANEL  LIPASE, BLOOD    Imaging Review Dg Chest 2 View  09/25/2015  CLINICAL DATA:  Chest pain with nausea EXAM: CHEST  2 VIEW COMPARISON:  September 07, 2015 and chest CT September 19, 2015 FINDINGS: There is no edema or consolidation. The heart size and pulmonary vascularity are normal. No pneumothorax. No adenopathy. No bone lesions. IMPRESSION: No edema or consolidation. Electronically Signed   By: Bretta BangWilliam  Woodruff III M.D.   On: 09/25/2015 18:32   I have personally reviewed and evaluated these images and lab results as part of my medical decision-making.   EKG Interpretation   Date/Time:  Monday September 25 2015 17:07:57 EDT Ventricular Rate:  85 PR Interval:  152 QRS Duration: 86 QT Interval:  364 QTC Calculation: 433 R Axis:   66 Text Interpretation:  Normal sinus rhythm Nonspecific T wave abnormality  Abnormal ECG BASELNE ARTIFACT. NO STEMI Confirmed by Donnald GarrePfeiffer, MD, Lebron ConnersMarcy  [phone]) on 09/25/2015 5:24:55 PM      MDM   Final diagnoses:  Epigastric pain   Patient has been  having ongoing and intermittent problems with abdominal pain. She was started on Prilosec 4 days ago but has only taken 2 doses. She also reports being scheduled for upper and lower endoscopy within the next several weeks. She describes her pain as being epigastric and radiating up towards her chest and her back. Started very intensely after drinking "really hot prune juice". At this time I suspect either peptic ulcer disease, gastritis, GERD or hiatal hernia. Patient's general condition appears well. At this time clinically she does not show signs of distress and diagnostic evaluation is stable. I will have the patient take her Prilosec daily, have her start Reglan and Carafate. Dietary measures have been described eating only very bland and low fat foods.   Arby Barrette, MD 09/25/15 2014

## 2015-09-25 NOTE — ED Notes (Signed)
Pt states having worse epigastric pain at this time

## 2015-09-25 NOTE — ED Notes (Signed)
Nurse first-adult female with pt requested w/c-arrived to car to find pt front passenger,bent over holding abd-pt stood, took steps and sat in w/c without difficulty

## 2015-09-25 NOTE — ED Notes (Signed)
Pt placed on cont cardiac monitoring, with cont POX and int NBP monitoring

## 2015-09-25 NOTE — ED Notes (Signed)
Patient states that she is having pain to her "chest and abdominal area"  - the patient points to her epigastric region and her lower abdominal area. The patient is hyperventilating and crying in triage. The patient reports that the pain started about 3 0 minutes ago to when she was drinking warm prune juice so she could have a BM.

## 2015-09-25 NOTE — ED Notes (Signed)
States she has a hernia, dx at [facility], approx 45 min had acute onset of abd pain, near epigastric , states she has been constipated, denies any N/V. States has poor appetite, due to fear of having pain or more constipation, states has lost approx 7 lbs past week. Scheduled for Colonoscopy and Endo by GI, April 17th.

## 2015-10-02 ENCOUNTER — Encounter (HOSPITAL_BASED_OUTPATIENT_CLINIC_OR_DEPARTMENT_OTHER): Payer: Self-pay | Admitting: *Deleted

## 2015-10-02 ENCOUNTER — Emergency Department (HOSPITAL_BASED_OUTPATIENT_CLINIC_OR_DEPARTMENT_OTHER)
Admission: EM | Admit: 2015-10-02 | Discharge: 2015-10-02 | Disposition: A | Discharge status: Home / Self Care | Payer: Self-pay | Attending: Emergency Medicine | Admitting: Emergency Medicine

## 2015-10-02 DIAGNOSIS — R1011 Right upper quadrant pain: Secondary | ICD-10-CM | POA: Insufficient documentation

## 2015-10-02 NOTE — ED Notes (Signed)
Right upper quad pain x 1 hour.

## 2015-10-11 ENCOUNTER — Encounter (HOSPITAL_BASED_OUTPATIENT_CLINIC_OR_DEPARTMENT_OTHER): Payer: Self-pay | Admitting: *Deleted

## 2015-10-11 DIAGNOSIS — F329 Major depressive disorder, single episode, unspecified: Secondary | ICD-10-CM | POA: Insufficient documentation

## 2015-10-11 DIAGNOSIS — Z87891 Personal history of nicotine dependence: Secondary | ICD-10-CM | POA: Insufficient documentation

## 2015-10-11 DIAGNOSIS — R1310 Dysphagia, unspecified: Secondary | ICD-10-CM | POA: Insufficient documentation

## 2015-10-11 NOTE — ED Notes (Addendum)
Pt c/o " coughing up blood and i have a lump in my throat that hurts " x 1 day, endoscopy done Monday.

## 2015-10-12 ENCOUNTER — Emergency Department (HOSPITAL_BASED_OUTPATIENT_CLINIC_OR_DEPARTMENT_OTHER)
Admission: EM | Admit: 2015-10-12 | Discharge: 2015-10-12 | Disposition: A | Discharge status: Home / Self Care | Payer: Self-pay | Attending: Emergency Medicine | Admitting: Emergency Medicine

## 2015-10-12 DIAGNOSIS — R1310 Dysphagia, unspecified: Secondary | ICD-10-CM

## 2015-10-12 MED ORDER — SODIUM CHLORIDE 0.9 % IV BOLUS (SEPSIS)
1000.0000 mL | Freq: Once | INTRAVENOUS | Status: AC
  Administered 2015-10-12: 1000 mL via INTRAVENOUS

## 2015-10-12 NOTE — ED Provider Notes (Signed)
CSN: [id number]     Arrival date & time 10/11/15  2135 History   First MD Initiated Contact with Patient 10/12/15 0040     Chief Complaint  Patient presents with  . Sore Throat     (Consider location/radiation/quality/duration/timing/severity/associated sxs/prior Treatment) HPI  This is a 35 year old female with a one-month history of pain in her throat. The pain occurs when she eats. She states it feels like the food sits in her throat and takes a long time to pass on. Consequently she has had decreased oral intake and believes she is dehydrated. She had an upper endoscopy done 3 days ago and had biopsies of her esophagus and duodenum. She states that nothing abnormal was seen on the endoscopy. She has vomited up small amounts of blood since then. She is not having hoarseness or difficulty breathing. She believes she has a mass in her anterior neck which is causing the pain.  Past Medical History  Diagnosis Date  . Anemia   . High cholesterol   . Anxiety   . Depression   . Insomnia   . Palpitation   . GERD (gastroesophageal reflux disease)   . PTSD (post-traumatic stress disorder)   . Anxiety    Past Surgical History  Procedure Laterality Date  . Breast reduction surgery    . Gallstone removal    . Wisdom tooth extraction    . Cholecystectomy     History reviewed. No pertinent family history. Social History  Substance Use Topics  . Smoking status: Former Smoker -- 0.25 packs/day    Types: Cigarettes  . Smokeless tobacco: Never Used  . Alcohol Use: No   OB History    No data available     Review of Systems  All other systems reviewed and are negative.   Allergies  Aspirin and Contrast media  Home Medications   Prior to Admission medications   Medication Sig Start Date End Date Taking? Authorizing Provider  cholecalciferol (VITAMIN D) 1000 units tablet Take 1,000 Units by mouth daily.    Historical Provider, MD  diphenhydramine-acetaminophen (TYLENOL PM) 25-500  MG TABS tablet Take 1 tablet by mouth at bedtime as needed.    Historical Provider, MD  ferrous sulfate 325 (65 FE) MG tablet Take 325 mg by mouth daily with breakfast.    Historical Provider, MD  fluconazole (DIFLUCAN) 150 MG tablet Take 1 tablet (150 mg total) by mouth once. Take after you finish your antibiotics. 08/25/15   Roxy Horsemanobert Browning, PA-C  HYDROcodone-acetaminophen (NORCO/VICODIN) 5-325 MG tablet Take 2 tablets by mouth every 4 (four) hours as needed. 08/25/15   Roxy Horsemanobert Browning, PA-C  methocarbamol (ROBAXIN) 500 MG tablet Take 1 tablet (500 mg total) by mouth every 8 (eight) hours as needed for muscle spasms. 09/07/15   Tilden FossaElizabeth Rees, MD  metoCLOPramide (REGLAN) 10 MG tablet Take 1 tablet (10 mg total) by mouth every 6 (six) hours. 09/25/15   Arby BarretteMarcy Pfeiffer, MD  metroNIDAZOLE (FLAGYL) 500 MG tablet Take 1 tablet (500 mg total) by mouth 2 (two) times daily. 08/25/15   Roxy Horsemanobert Browning, PA-C  omeprazole (PRILOSEC) 20 MG capsule Take 1 capsule (20 mg total) by mouth daily. 08/29/15   Heather Laisure, PA-C  promethazine (PHENERGAN) 25 MG tablet Take 1 tablet (25 mg total) by mouth every 6 (six) hours as needed for nausea or vomiting. 08/16/15   Vanetta MuldersScott Zackowski, MD  sucralfate (CARAFATE) 1 GM/10ML suspension Take 10 mLs (1 g total) by mouth 4 (four) times daily -  with meals  and at bedtime. 09/25/15   Arby Barrette, MD  tinidazole (TINDAMAX) 500 MG tablet Take 2 tablets (1,000 mg total) by mouth daily with breakfast. 07/26/15   Eyvonne Mechanic, PA-C   BP 122/87 mmHg  Pulse 66  Temp(Src) 98.1 F (36.7 C) (Oral)  Resp 16  Ht  (1.6 m)  Wt 145 lb (65.772 kg)  BMI 25.69 kg/m2  SpO2 100%  LMP 10/11/2015   Physical Exam  General: Well-developed, well-nourished female in no acute distress; appearance consistent with age of record HENT: normocephalic; atraumatic; no thyromegaly; cartilaginous structures of the anterior neck are of normal appearance and palpation without tenderness; the mass she is focused  on looks and feels like a normal part of her laryngeal cartilage; normal pharynx; no dysphonia; no stridor Eyes: pupils equal, round and reactive to light; extraocular muscles intact Neck: supple Heart: regular rate and rhythm Lungs: clear to auscultation bilaterally Abdomen: soft; nondistended; nontender; bowel sounds present Extremities: No deformity; full range of motion; pulses normal Neurologic: Awake, alert and oriented; motor function intact in all extremities and symmetric; no facial droop Skin: Warm and dry Psychiatric: Normal mood and affect    ED Course  Procedures (including critical care time)   MDM  2:29 AM Patient feeling better after IV fluid bolus. She will follow-up with her gastroenterologist. She was advised to inquire about a barium swallow study.  [name], MD 10/12/15 [phone]

## 2015-10-12 NOTE — ED Notes (Signed)
Pt reports throat pain that increases with swallowing

## 2015-10-17 ENCOUNTER — Emergency Department (HOSPITAL_BASED_OUTPATIENT_CLINIC_OR_DEPARTMENT_OTHER)
Admission: EM | Admit: 2015-10-17 | Discharge: 2015-10-17 | Disposition: A | Discharge status: Home / Self Care | Payer: Self-pay | Attending: Emergency Medicine | Admitting: Emergency Medicine

## 2015-10-17 ENCOUNTER — Encounter (HOSPITAL_BASED_OUTPATIENT_CLINIC_OR_DEPARTMENT_OTHER): Payer: Self-pay | Admitting: *Deleted

## 2015-10-17 DIAGNOSIS — R1010 Upper abdominal pain, unspecified: Secondary | ICD-10-CM | POA: Insufficient documentation

## 2015-10-17 DIAGNOSIS — R200 Anesthesia of skin: Secondary | ICD-10-CM | POA: Insufficient documentation

## 2015-10-17 DIAGNOSIS — F329 Major depressive disorder, single episode, unspecified: Secondary | ICD-10-CM | POA: Insufficient documentation

## 2015-10-17 DIAGNOSIS — R350 Frequency of micturition: Secondary | ICD-10-CM | POA: Insufficient documentation

## 2015-10-17 DIAGNOSIS — M546 Pain in thoracic spine: Secondary | ICD-10-CM | POA: Insufficient documentation

## 2015-10-17 DIAGNOSIS — Z87891 Personal history of nicotine dependence: Secondary | ICD-10-CM | POA: Insufficient documentation

## 2015-10-17 LAB — COMPREHENSIVE METABOLIC PANEL
ALBUMIN: 3.6 g/dL (ref 3.5–5.0)
ALK PHOS: 57 U/L (ref 38–126)
ALT: 14 U/L (ref 14–54)
ANION GAP: 6 (ref 5–15)
AST: 16 U/L (ref 15–41)
BUN: 8 mg/dL (ref 6–20)
CO2: 25 mmol/L (ref 22–32)
Calcium: 8.9 mg/dL (ref 8.9–10.3)
Chloride: 105 mmol/L (ref 101–111)
Creatinine, Ser: 0.72 mg/dL (ref 0.44–1.00)
GFR calc Af Amer: >60 mL/min (ref >60)
GFR calc non Af Amer: >60 mL/min (ref >60)
GLUCOSE: 83 mg/dL (ref 65–99)
POTASSIUM: 4.4 mmol/L (ref 3.5–5.1)
SODIUM: 136 mmol/L (ref 135–145)
Total Bilirubin: 0.8 mg/dL (ref 0.3–1.2)
Total Protein: 6.7 g/dL (ref 6.5–8.1)

## 2015-10-17 LAB — CBC WITH DIFFERENTIAL/PLATELET
BASOS ABS: 0 10*3/uL (ref 0.0–0.1)
Basophils Relative: 0 %
EOS ABS: 0.2 10*3/uL (ref 0.0–0.7)
Eosinophils Relative: 4 %
HCT: 35.5 % — ABNORMAL LOW (ref 36.0–46.0)
HEMOGLOBIN: 11.5 g/dL — AB (ref 12.0–15.0)
LYMPHS PCT: 37 %
Lymphs Abs: 2 10*3/uL (ref 0.7–4.0)
MCH: 24.1 pg — ABNORMAL LOW (ref 26.0–34.0)
MCHC: 32.4 g/dL (ref 30.0–36.0)
MCV: 74.4 fL — ABNORMAL LOW (ref 78.0–100.0)
Monocytes Absolute: 0.5 10*3/uL (ref 0.1–1.0)
Monocytes Relative: 9 %
NEUTROS ABS: 2.8 10*3/uL (ref 1.7–7.7)
NEUTROS PCT: 50 %
Platelets: 253 10*3/uL (ref 150–400)
RBC: 4.77 MIL/uL (ref 3.87–5.11)
RDW: 14.8 % (ref 11.5–15.5)
WBC: 5.5 10*3/uL (ref 4.0–10.5)

## 2015-10-17 LAB — URINALYSIS, ROUTINE W REFLEX MICROSCOPIC
Bilirubin Urine: NEGATIVE
GLUCOSE, UA: NEGATIVE mg/dL
Hgb urine dipstick: NEGATIVE
Ketones, ur: NEGATIVE mg/dL
LEUKOCYTES UA: NEGATIVE
Nitrite: NEGATIVE
PROTEIN: NEGATIVE mg/dL
Specific Gravity, Urine: 1.013 (ref 1.005–1.030)
pH: 6.5 (ref 5.0–8.0)

## 2015-10-17 LAB — LIPASE, BLOOD: Lipase: 26 U/L (ref 11–51)

## 2015-10-17 MED ORDER — GI COCKTAIL ~~LOC~~
30.0000 mL | Freq: Once | ORAL | Status: AC
  Administered 2015-10-17: 30 mL via ORAL
  Filled 2015-10-17: qty 30

## 2015-10-17 MED ORDER — OMEPRAZOLE 20 MG PO CPDR: 20.0000 mg | DELAYED_RELEASE_CAPSULE | Freq: Every day | ORAL | Status: DC

## 2015-10-17 NOTE — ED Notes (Signed)
C/o back pain upper and middle back and also mid abd pain. Onset 2 weeks ago. Frequent urination, no burning.

## 2015-10-17 NOTE — Discharge Instructions (Signed)
Make sure to establish your primary care provider and follow up for continuity of care. If your symptoms worsen, develop a fever, chest pain, or shortness of breath come to the ED immediately. Take omeprazole for acid reflux. Take tylenol for pain relief.   Back Pain, Adult Back pain is very common. The pain often gets better over time. The cause of back pain is usually not dangerous. Most people can learn to manage their back pain on their own.  HOME CARE  Watch your back pain for any changes. The following actions may help to lessen any pain you are feeling:  Stay active. Start with short walks on flat ground if you can. Try to walk farther each day.  Exercise regularly as told by your doctor. Exercise helps your back heal faster. It also helps avoid future injury by keeping your muscles strong and flexible.  Do not sit, drive, or stand in one place for more than 30 minutes.  Do not stay in bed. Resting more than 1-2 days can slow down your recovery.  Be careful when you bend or lift an object. Use good form when lifting:  Bend at your knees.  Keep the object close to your body.  Do not twist.  Sleep on a firm mattress. Lie on your side, and bend your knees. If you lie on your back, put a pillow under your knees.  Take medicines only as told by your doctor.  Put ice on the injured area.  Put ice in a plastic bag.  Place a towel between your skin and the bag.  Leave the ice on for 20 minutes, 2-3 times a day for the first 2-3 days. After that, you can switch between ice and heat packs.  Avoid feeling anxious or stressed. Find good ways to deal with stress, such as exercise.  Maintain a healthy weight. Extra weight puts stress on your back. GET HELP IF:   You have pain that does not go away with rest or medicine.  You have worsening pain that goes down into your legs or buttocks.  You have pain that does not get better in one week.  You have pain at night.  You lose  weight.  You have a fever or chills. GET HELP RIGHT AWAY IF:   You cannot control when you poop (bowel movement) or pee (urinate).  Your arms or legs feel weak.  Your arms or legs lose feeling (numbness).  You feel sick to your stomach (nauseous) or throw up (vomit).  You have belly (abdominal) pain.  You feel like you may pass out (faint).   This information is not intended to replace advice given to you by your health care provider. Make sure you discuss any questions you have with your health care provider.   Document Released: 11/27/2007 Document Revised: 07/01/2014 Document Reviewed: 10/12/2013 Elsevier Interactive Patient Education 2016 [employer]  Abdominal Pain, Adult Many things can cause belly (abdominal) pain. Most times, the belly pain is not dangerous. Many cases of belly pain can be watched and treated at home. HOME CARE   Do not take medicines that help you go poop (laxatives) unless told to by your doctor.  Only take medicine as told by your doctor.  Eat or drink as told by your doctor. Your doctor will tell you if you should be on a special diet. GET HELP IF:  You do not know what is causing your belly pain.  You have belly pain while you are  sick to your stomach (nauseous) or have runny poop (diarrhea).  You have pain while you pee or poop.  Your belly pain wakes you up at night.  You have belly pain that gets worse or better when you eat.  You have belly pain that gets worse when you eat fatty foods.  You have a fever. GET HELP RIGHT AWAY IF:   The pain does not go away within 2 hours.  You keep throwing up (vomiting).  The pain changes and is only in the right or left part of the belly.  You have bloody or tarry looking poop. MAKE SURE YOU:   Understand these instructions.  Will watch your condition.  Will get help right away if you are not doing well or get worse.   This information is not intended to replace advice given to you  by your health care provider. Make sure you discuss any questions you have with your health care provider.   Document Released: 11/27/2007 Document Revised: 07/01/2014 Document Reviewed: 02/17/2013 Elsevier Interactive Patient Education [employer]

## 2015-10-17 NOTE — ED Provider Notes (Signed)
CSN: [id number]     Arrival date & time 10/17/15  1508 History   First MD Initiated Contact with Patient 10/17/15 1538     Chief Complaint  Patient presents with  . Back Pain  . Abdominal Pain     (Consider location/radiation/quality/duration/timing/severity/associated sxs/prior Treatment) HPI Comments: Patient reports to ED with complaint of abdominal pain and back pain. Abdominal pain started early yesterday in the left upper quadrant with radiation to epigastric region, intermittent 7-8/10,cramping and stinging in nature. Associated nausea. No vomiting, diarrhea, constipation, or blood in stool. Has tried Advil with no relief. Patient does not have her gallbladder. Has a history of acid reflux and abdominal pain. Recently underwent upper endoscopy and is awaiting biopsy results, but otherwise normal imaging.   Back pain started approximately one month ago following lifting/moving heavy boxes. Pain is intermittent and will last for generally 5 days. Pain is a burning, stinging, sharp, and spasm in nature., Has tried Advil, Tylenol, and muscle relaxer with minimal relief of symptoms. Denies fever, IVD, or loss of bowel or bladder dysfunction. Endorses numbness, tingling, and weakness in feet.   Patient is a 35 y.o. female presenting with back pain and abdominal pain. The history is provided by the patient.  Back Pain Location:  Thoracic spine Quality:  Burning Associated symptoms: abdominal pain, numbness and weakness   Associated symptoms: no chest pain, no dysuria, no fever and no headaches   Abdominal pain:    Location:  Epigastric and RUQ   Quality:  Cramping Abdominal Pain Associated symptoms: fatigue and nausea   Associated symptoms: no chest pain, no chills, no constipation, no diarrhea, no dysuria, no fever, no hematuria, no shortness of breath, no sore throat and no vomiting     Past Medical History  Diagnosis Date  . Anemia   . High cholesterol   . Anxiety   . Depression    . Insomnia   . Palpitation   . GERD (gastroesophageal reflux disease)   . PTSD (post-traumatic stress disorder)   . Anxiety    Past Surgical History  Procedure Laterality Date  . Breast reduction surgery    . Gallstone removal    . Wisdom tooth extraction    . Cholecystectomy     No family history on file. Social History  Substance Use Topics  . Smoking status: Former Smoker -- 0.25 packs/day    Types: Cigarettes  . Smokeless tobacco: Never Used  . Alcohol Use: No   OB History    No data available     Review of Systems  Constitutional: Positive for fatigue. Negative for fever, chills and diaphoresis.  HENT: Negative for sore throat.   Eyes: Negative for visual disturbance.  Respiratory: Negative for shortness of breath.   Cardiovascular: Negative for chest pain.  Gastrointestinal: Positive for nausea and abdominal pain. Negative for vomiting, diarrhea, constipation and blood in stool.  Genitourinary: Positive for frequency. Negative for dysuria and hematuria.  Musculoskeletal: Positive for back pain and neck pain.  Skin: Negative for rash.  Neurological: Positive for dizziness, weakness and numbness. Negative for headaches.      Allergies  Aspirin and Contrast media  Home Medications   Prior to Admission medications   Medication Sig Start Date End Date Taking? Authorizing Provider  omeprazole (PRILOSEC) 20 MG capsule Take 1 capsule (20 mg total) by mouth daily. 10/17/15   Lona Kettle, PA-C   BP 117/76 mmHg  Pulse 62  Temp(Src) 98.7 F (37.1 C)  Resp 18  Ht 5\' 3"[id number]$  (1.6 m)  Wt 65.772 kg  BMI 25.69 kg/m2  SpO2 100%  LMP 10/11/2015 Physical Exam  Constitutional: She appears well-developed and well-nourished. No distress.  HENT:  Head: Normocephalic and atraumatic.  Mouth/Throat: Oropharynx is clear and moist. No oropharyngeal exudate.  Eyes: Conjunctivae and EOM are normal. Pupils are equal, round, and reactive to light. Right eye exhibits no  discharge. Left eye exhibits no discharge. No scleral icterus.  Neck: Normal range of motion. Neck supple.  Cardiovascular: Normal rate, regular rhythm, normal heart sounds and intact distal pulses.   No murmur heard. Pulmonary/Chest: Effort normal and breath sounds normal. No respiratory distress.  Abdominal: Soft. Bowel sounds are normal. She exhibits no distension and no mass. There is tenderness in the right upper quadrant and epigastric area. There is no rebound and no guarding.  Completed bedside abdominal aorta ultrasound with Denyce RobertHannah Mutersbaugh, PA-C. Aorta diameter ranged from 1.2-1.6cm from proximal abdominal aorta to bifurcation.    Musculoskeletal: Normal range of motion.       Arms: Lymphadenopathy:    She has no cervical adenopathy.  Neurological: She is alert. Coordination normal.  Lower extremity sensation and strength grossly intact.   Skin: Skin is warm and dry. She is not diaphoretic.  Psychiatric: She has a normal mood and affect.    ED Course  Procedures (including critical care time) Labs Review Labs Reviewed  CBC WITH DIFFERENTIAL/PLATELET - Abnormal; Notable for the following:    Hemoglobin 11.5 (*)    HCT 35.5 (*)    MCV 74.4 (*)    MCH 24.1 (*)    All other components within normal limits  URINALYSIS, ROUTINE W REFLEX MICROSCOPIC (NOT AT [facility])  COMPREHENSIVE METABOLIC PANEL  LIPASE, BLOOD   EMERGENCY DEPARTMENT US ABD/AORTA EXAM Study: Limited Ultrasound of the Abdominal Aorta.  INDICATIONS:Abdominal pain Indication: Multiple views of the abdominal aorta are obtained from the diaphragmatic hiatus to the aortic bifurcation in transverse and sagittal planes with a multi- Frequency probe.  PERFORMED BY: Other (See attached note)  IMAGES ARCHIVED?: Yes  FINDINGS: Free fluid absent; diameter of abdominal aorta ranged from 1.2-1.6cm proximal to bifurcation.   LIMITATIONS:  Bowel gas  INTERPRETATION:  No abdominal aortic aneurysm  COMMENT:     Imaging Review No results found. I have personally reviewed and evaluated these images and lab results as part of my medical decision-making.   EKG Interpretation None      MDM   Final diagnoses:  Pain of upper abdomen  Thoracic back pain, unspecified back pain laterality   Patient comes to the ED with complaint of upper abdominal pain. She was recently seen in the ED for similar symptoms as well as underwent upper EGD and she is awaiting biopsy results. UA normal, no CVA tenderness, afebrile - unlikely to be UTI nephrolithiasis, or pyelonephritis. Patient hemoglobin/hct slightly low; however, chronically low and stable compared to previous lab values. CMP unremarkable. Lipase within normal limits - unlikely to be pancreatitis. Bedside abdominal aorta US completed by Dierdre ForthHannah Muthersbaugh, PA-C shows 1.2-1.6cm aortic diameter from proximal aorta to bifurcation - little concern for aortic dissection or aneurysm. Patient gallbladder removed - no cholecystitis. Suspect either PUD, acid reflux, or gastritis. Patient received GI cocktail and endorsed improvement in symptoms. Patient is not currently on acid reflux treatment. Will prescribe omeprazole for symptomatic relief. Encouraged patient to switch from advil to tylenol for pain relief and to reduce Advil's potential exacerbation of acid reflux. Encouraged patient to establish PCP for  further follow up and evaluation.   Regarding back pain, no red flag symptoms, strength and sensation are grossly intact - less concern for cauda equina or epidural abscess. Pain may be musculoskeletal and secondary to previous injury that occurred approximately one month ago. Try tylenol for pain relief. Follow up with PCP if symptoms persist. Discussed return precautions.   Discussed treatment plan with patient. Patient voiced understanding and is agreeable.     Lona Kettle, PA-C 10/18/15 0413  Lona Kettle, PA-C 10/18/15 0414  Lona Kettle, PA-C 10/26/15 1610  Rolland Porter, MD 10/30/15 [phone]

## 2015-10-20 ENCOUNTER — Emergency Department (HOSPITAL_BASED_OUTPATIENT_CLINIC_OR_DEPARTMENT_OTHER)
Admission: EM | Admit: 2015-10-20 | Discharge: 2015-10-20 | Discharge status: Against Medical Advice | Payer: Self-pay | Attending: Emergency Medicine | Admitting: Emergency Medicine

## 2015-10-20 ENCOUNTER — Encounter (HOSPITAL_BASED_OUTPATIENT_CLINIC_OR_DEPARTMENT_OTHER): Payer: Self-pay | Admitting: *Deleted

## 2015-10-20 DIAGNOSIS — R51 Headache: Secondary | ICD-10-CM | POA: Insufficient documentation

## 2015-10-20 DIAGNOSIS — R519 Headache, unspecified: Secondary | ICD-10-CM

## 2015-10-20 DIAGNOSIS — Z87891 Personal history of nicotine dependence: Secondary | ICD-10-CM | POA: Insufficient documentation

## 2015-10-20 DIAGNOSIS — F329 Major depressive disorder, single episode, unspecified: Secondary | ICD-10-CM | POA: Insufficient documentation

## 2015-10-20 MED ORDER — DIPHENHYDRAMINE HCL 50 MG/ML IJ SOLN
25.0000 mg | Freq: Once | INTRAMUSCULAR | Status: DC
  Filled 2015-10-20: qty 1

## 2015-10-20 MED ORDER — KETOROLAC TROMETHAMINE 15 MG/ML IJ SOLN
15.0000 mg | Freq: Once | INTRAMUSCULAR | Status: DC
Start: 2015-10-20 — End: 2015-10-21
  Filled 2015-10-20: qty 1

## 2015-10-20 MED ORDER — SODIUM CHLORIDE 0.9 % IV BOLUS (SEPSIS): 1000.0000 mL | Freq: Once | INTRAVENOUS | Status: DC

## 2015-10-20 MED ORDER — METOCLOPRAMIDE HCL 5 MG/ML IJ SOLN
10.0000 mg | Freq: Once | INTRAMUSCULAR | Status: DC
Start: 2015-10-20 — End: 2015-10-21
  Filled 2015-10-20: qty 2

## 2015-10-20 NOTE — ED Notes (Signed)
MD at bedside. 

## 2015-10-20 NOTE — ED Notes (Signed)
Patient updated that MD does not feel a CT is warrented based on her complaint. Patient was again offered the migraine cocktail, she declined. States she wants to be discharged.

## 2015-10-20 NOTE — ED Notes (Signed)
Pt reports head pressure x 1 day.  A/O x 4.  No acute distress noted.

## 2015-10-20 NOTE — ED Notes (Signed)
Entered patient's room to start IV and give fluids. Patient states she does not want "the migraine medicine, it don't work, I have a history of migraines and this feels different, I want a scan". Clarified with patient that she does not want the migraine cocktail, she again said no, that she wanted a scan. [place], MD updated.

## 2015-10-20 NOTE — ED Provider Notes (Signed)
CSN: [id number]     Arrival date & time 10/20/15  2056 History   By signing my name below, I, Arlan OrganAshley Leger, attest that this documentation has been prepared under the direction and in the presence of Paula LibraJohn Auriel Kist, MD.  Electronically Signed: Arlan OrganAshley Leger, ED Scribe. 10/20/2015. 11:39 PM.   Chief Complaint  Patient presents with  . Headache   HPI  HPI Comments: Patricia Wilcox is a 35 y.o. female with a PMHx of high cholesterol who presents to the Emergency Department complaining of a constant, ongoing occipital HA x 1-2 days. Pt states "it feels like my head is real inflamed". She also reports intermittent nausea and lightheadedness. Pt states nausea is exacerbated with movement. No alleviating factors. She has taken Tylenol without relief. No recent fever, chills, or vomiting.   PCP: Pcp Not In System    Past Medical History  Diagnosis Date  . Anemia   . High cholesterol   . Anxiety   . Depression   . Insomnia   . Palpitation   . GERD (gastroesophageal reflux disease)   . PTSD (post-traumatic stress disorder)   . Anxiety    Past Surgical History  Procedure Laterality Date  . Breast reduction surgery    . Gallstone removal    . Wisdom tooth extraction    . Cholecystectomy     History reviewed. No pertinent family history. Social History  Substance Use Topics  . Smoking status: Former Smoker -- 0.25 packs/day    Types: Cigarettes  . Smokeless tobacco: Never Used  . Alcohol Use: No   OB History    No data available     Review of Systems  All other systems reviewed and are negative.   Allergies  Aspirin and Contrast media  Home Medications   Prior to Admission medications   Medication Sig Start Date End Date Taking? Authorizing Provider  omeprazole (PRILOSEC) 20 MG capsule Take 1 capsule (20 mg total) by mouth daily. 10/17/15   Lona KettleAshley Laurel Meyer, PA-C   Triage Vitals: BP 115/65 mmHg  Pulse 95  Temp(Src) 98.5 F (36.9 C) (Oral)  Resp 18  Ht 5\' 3"  (1.6 m)   Wt 145 lb (65.772 kg)  BMI 25.69 kg/m2  SpO2 100%  LMP 10/11/2015   Physical Exam General: Well-developed, well-nourished female in no acute distress; appearance consistent with age of record HENT: normocephalic; atraumatic Eyes: pupils equal, round and reactive to light; extraocular muscles intact Neck: supple Heart: regular rate and rhythm Lungs: clear to auscultation bilaterally Abdomen: soft; nondistended; nontender; bowel sounds present Extremities: No deformity; full range of motion; pulses normal Neurologic: Awake, alert and oriented; motor function intact in all extremities and symmetric; no facial droop; normal coordination and speech  Skin: Warm and dry Psychiatric: Flat affect   ED Course  Procedures (including critical care time)  DIAGNOSTIC STUDIES: Oxygen Saturation is 100% on RA, Normal by my interpretation.    COORDINATION OF CARE: 11:35 PM- Will give migraine cocktail. Discussed treatment plan with pt at bedside and pt agreed to plan.     11:50 PM- Pt is demanding a CT and declining migraine cocktail as she states "it doesn't work". Patient elected to sign out AMA.  MDM   Final diagnoses:  Occipital headache   I personally performed the services described in this documentation, which was scribed in my presence. The recorded information has been reviewed and is accurate.   Paula LibraJohn Sarthak Rubenstein, MD 10/21/15 0005

## 2016-01-12 ENCOUNTER — Emergency Department (HOSPITAL_BASED_OUTPATIENT_CLINIC_OR_DEPARTMENT_OTHER)
Admission: EM | Admit: 2016-01-12 | Discharge: 2016-01-12 | Disposition: A | Discharge status: Home / Self Care | Payer: Self-pay | Attending: Dermatology | Admitting: Dermatology

## 2016-01-12 ENCOUNTER — Encounter (HOSPITAL_BASED_OUTPATIENT_CLINIC_OR_DEPARTMENT_OTHER): Payer: Self-pay

## 2016-01-12 ENCOUNTER — Emergency Department (HOSPITAL_BASED_OUTPATIENT_CLINIC_OR_DEPARTMENT_OTHER)
Admission: EM | Admit: 2016-01-12 | Discharge: 2016-01-12 | Disposition: A | Discharge status: Home / Self Care | Payer: Self-pay | Attending: Emergency Medicine | Admitting: Emergency Medicine

## 2016-01-12 ENCOUNTER — Encounter (HOSPITAL_BASED_OUTPATIENT_CLINIC_OR_DEPARTMENT_OTHER): Payer: Self-pay | Admitting: *Deleted

## 2016-01-12 DIAGNOSIS — R102 Pelvic and perineal pain: Secondary | ICD-10-CM | POA: Insufficient documentation

## 2016-01-12 DIAGNOSIS — F329 Major depressive disorder, single episode, unspecified: Secondary | ICD-10-CM | POA: Insufficient documentation

## 2016-01-12 DIAGNOSIS — F17210 Nicotine dependence, cigarettes, uncomplicated: Secondary | ICD-10-CM | POA: Insufficient documentation

## 2016-01-12 DIAGNOSIS — Z5321 Procedure and treatment not carried out due to patient leaving prior to being seen by health care provider: Secondary | ICD-10-CM | POA: Insufficient documentation

## 2016-01-12 LAB — URINALYSIS, ROUTINE W REFLEX MICROSCOPIC
Glucose, UA: NEGATIVE mg/dL
HGB URINE DIPSTICK: NEGATIVE
KETONES UR: 15 mg/dL — AB
LEUKOCYTES UA: NEGATIVE
NITRITE: NEGATIVE
Protein, ur: NEGATIVE mg/dL
Specific Gravity, Urine: 1.041 — ABNORMAL HIGH (ref 1.005–1.030)
pH: 6 (ref 5.0–8.0)

## 2016-01-12 LAB — WET PREP, GENITAL
Clue Cells Wet Prep HPF POC: NONE SEEN
SPERM: NONE SEEN
Trich, Wet Prep: NONE SEEN
Yeast Wet Prep HPF POC: NONE SEEN

## 2016-01-12 LAB — PREGNANCY, URINE: Preg Test, Ur: NEGATIVE

## 2016-01-12 LAB — GC/CHLAMYDIA PROBE AMP (~~LOC~~) NOT AT ARMC
CHLAMYDIA, DNA PROBE: NEGATIVE
Neisseria Gonorrhea: NEGATIVE

## 2016-01-12 MED ORDER — LIDOCAINE HCL (PF) 1 % IJ SOLN
INTRAMUSCULAR | Status: AC
  Administered 2016-01-12: 0.9 mL
  Filled 2016-01-12: qty 5

## 2016-01-12 MED ORDER — AZITHROMYCIN 250 MG PO TABS
1000.0000 mg | ORAL_TABLET | Freq: Once | ORAL | Status: AC
  Administered 2016-01-12: 1000 mg via ORAL
  Filled 2016-01-12: qty 4

## 2016-01-12 MED ORDER — CEFTRIAXONE SODIUM 250 MG IJ SOLR
250.0000 mg | Freq: Once | INTRAMUSCULAR | Status: AC
  Administered 2016-01-12: 250 mg via INTRAMUSCULAR
  Filled 2016-01-12: qty 250

## 2016-01-12 NOTE — ED Notes (Signed)
Snack drink given 

## 2016-01-12 NOTE — ED Notes (Signed)
Pt met nurse at her room door and was dressed and said that she had to go.  Stated that her ride has to be at work at 2330 and he is already running late.  Pt stated that she was here yesterday and did not get a rx for pain medicine and that is why she is here.  Sts she has a f/u appt made but she wanted some rx pain med to hold her until that time.   Pt not willing to wait any more for edp eval.

## 2016-01-12 NOTE — ED Notes (Signed)
Pt c/o vaginal pain- states she was seen last night for same-states she returned for pain med

## 2016-01-12 NOTE — Discharge Instructions (Signed)
Call GYN to arrange a follow-up appointment. The contact information for women's outpatient clinic has been provided in this discharge summary.  We will call you if your cultures indicate you require further treatment or additional action.   Pelvic Pain, Female Female pelvic pain can be caused by many different things and start from a variety of places. Pelvic pain refers to pain that is located in the lower half of the abdomen and between your hips. The pain may occur over a short period of time (acute) or may be reoccurring (chronic). The cause of pelvic pain may be related to disorders affecting the female reproductive organs (gynecologic), but it may also be related to the bladder, kidney stones, an intestinal complication, or muscle or skeletal problems. Getting help right away for pelvic pain is important, especially if there has been severe, sharp, or a sudden onset of unusual pain. It is also important to get help right away because some types of pelvic pain can be life threatening.  CAUSES  Below are only some of the causes of pelvic pain. The causes of pelvic pain can be in one of several categories.   Gynecologic.  Pelvic inflammatory disease.  Sexually transmitted infection.  Ovarian cyst or a twisted ovarian ligament (ovarian torsion).  Uterine lining that grows outside the uterus (endometriosis).  Fibroids, cysts, or tumors.  Ovulation.  Pregnancy.  Pregnancy that occurs outside the uterus (ectopic pregnancy).  Miscarriage.  Labor.  Abruption of the placenta or ruptured uterus.  Infection.  Uterine infection (endometritis).  Bladder infection.  Diverticulitis.  Miscarriage related to a uterine infection (septic abortion).  Bladder.  Inflammation of the bladder (cystitis).  Kidney stone(s).  Gastrointestinal.  Constipation.  Diverticulitis.  Neurologic.  Trauma.  Feeling pelvic pain because of mental or emotional causes  (psychosomatic).  Cancers of the bowel or pelvis. EVALUATION  Your caregiver will want to take a careful history of your concerns. This includes recent changes in your health, a careful gynecologic history of your periods (menses), and a sexual history. Obtaining your family history and medical history is also important. Your caregiver may suggest a pelvic exam. A pelvic exam will help identify the location and severity of the pain. It also helps in the evaluation of which organ system may be involved. In order to identify the cause of the pelvic pain and be properly treated, your caregiver may order tests. These tests may include:   A pregnancy test.  Pelvic ultrasonography.  An X-ray exam of the abdomen.  A urinalysis or evaluation of vaginal discharge.  Blood tests. HOME CARE INSTRUCTIONS   Only take over-the-counter or prescription medicines for pain, discomfort, or fever as directed by your caregiver.   Rest as directed by your caregiver.   Eat a balanced diet.   Drink enough fluids to make your urine clear or pale yellow, or as directed.   Avoid sexual intercourse if it causes pain.   Apply warm or cold compresses to the lower abdomen depending on which one helps the pain.   Avoid stressful situations.   Keep a journal of your pelvic pain. Write down when it started, where the pain is located, and if there are things that seem to be associated with the pain, such as food or your menstrual cycle.  Follow up with your caregiver as directed.  SEEK MEDICAL CARE IF:  Your medicine does not help your pain.  You have abnormal vaginal discharge. SEEK IMMEDIATE MEDICAL CARE IF:   You have heavy  bleeding from the vagina.   Your pelvic pain increases.   You feel light-headed or faint.   You have chills.   You have pain with urination or blood in your urine.   You have uncontrolled diarrhea or vomiting.   You have a fever or persistent symptoms for more  than 3 days.  You have a fever and your symptoms suddenly get worse.   You are being physically or sexually abused.   This information is not intended to replace advice given to you by your health care provider. Make sure you discuss any questions you have with your health care provider.   Document Released: 05/07/2004 Document Revised: 03/01/2015 Document Reviewed: 09/30/2011 Elsevier Interactive Patient Education [employer]

## 2016-01-12 NOTE — ED Notes (Signed)
Pt c/o vaginal pain that started 3 hours ago. States hx of same, but states this is worse and radiates into left thigh area. Describes as burning. States she has been treated recently for bacterial vaginosis and a yeast infection. States burning with urination. No concern with STD. Denies fevers.

## 2016-01-12 NOTE — ED Notes (Signed)
MD with pt  

## 2016-01-12 NOTE — ED Provider Notes (Signed)
CSN: [id number]     Arrival date & time 01/12/16  0023 History   First MD Initiated Contact with Patient 01/12/16 0057     Chief Complaint  Patient presents with  . vaginal pain      (Consider location/radiation/quality/duration/timing/severity/associated sxs/prior Treatment) HPI Comments: Patient is a 35 year old female with past medical history of anxiety, depression, PTSD. She presents for evaluation of burning in her vagina. She reports this is been ongoing for intermittently for the past year and a half. She's been seen by multiple physicians. Recently she was diagnosed with bacterial vaginosis and treated with Flagyl. This seemed to help, however now her symptoms are back. She denies any abnormal bleeding. She denies any fevers or chills. She reports she is sexually active, however with another female and denies the possibility of pregnancy.  The history is provided by the patient.    Past Medical History  Diagnosis Date  . Anemia   . High cholesterol   . Anxiety   . Depression   . Insomnia   . Palpitation   . GERD (gastroesophageal reflux disease)   . PTSD (post-traumatic stress disorder)   . Anxiety    Past Surgical History  Procedure Laterality Date  . Breast reduction surgery    . Gallstone removal    . Wisdom tooth extraction    . Cholecystectomy     History reviewed. No pertinent family history. Social History  Substance Use Topics  . Smoking status: Current Every Day Smoker -- 0.25 packs/day    Types: Cigarettes  . Smokeless tobacco: Never Used  . Alcohol Use: No   OB History    No data available     Review of Systems  All other systems reviewed and are negative.     Allergies  Aspirin and Contrast media  Home Medications   Prior to Admission medications   Medication Sig Start Date End Date Taking? Authorizing Provider  omeprazole (PRILOSEC) 20 MG capsule Take 1 capsule (20 mg total) by mouth daily. 10/17/15   Lona Kettle, PA-C   BP  113/72 mmHg  Pulse 79  Temp(Src) 99.3 F (37.4 C) (Oral)  Resp 24  Ht  (1.651 m)  Wt 145 lb (65.772 kg)  BMI 24.13 kg/m2  SpO2 100%  LMP 12/24/2015 (Approximate) Physical Exam  Constitutional: She is oriented to person, place, and time. She appears well-developed and well-nourished. No distress.  HENT:  Head: Normocephalic and atraumatic.  Neck: Normal range of motion. Neck supple.  Cardiovascular: Normal rate and regular rhythm.  Exam reveals no gallop and no friction rub.   No murmur heard. Pulmonary/Chest: Effort normal and breath sounds normal. No respiratory distress. She has no wheezes.  Abdominal: Soft. Bowel sounds are normal. She exhibits no distension. There is no tenderness.  Genitourinary:  Pelvic exam reveals normal external genitalia. The cervix appears normal. There is a slight whitish yellow discharge present. There are no adnexal masses or tenderness. There is no CMT.  Musculoskeletal: Normal range of motion.  Neurological: She is alert and oriented to person, place, and time.  Skin: Skin is warm and dry. She is not diaphoretic.  Nursing note and vitals reviewed.   ED Course  Procedures (including critical care time) Labs Review Labs Reviewed  WET PREP, GENITAL  URINALYSIS, ROUTINE W REFLEX MICROSCOPIC (NOT AT [facility])  PREGNANCY, URINE  GC/CHLAMYDIA PROBE AMP ([place]) NOT AT [facility]    Imaging Review No results found. I have personally reviewed and evaluated these images  and lab results as part of my medical decision-making.    MDM   Final diagnoses:  None    Wet prep reveals many WBCs, however is otherwise unremarkable. There is no evidence for BV or yeast infection. She will be treated with Rocephin and Zithromax. If her symptoms are not improving she is to follow-up with a GYN doctor. She reports the symptoms have been recurrent for nearly one and one half years. I see no indication for additional workup at this time and believe she is  appropriate for outpatient follow-up.    Geoffery Lyonsouglas Brayon Bielefeld, MD 01/12/16 [phone]

## 2016-01-14 ENCOUNTER — Encounter (HOSPITAL_BASED_OUTPATIENT_CLINIC_OR_DEPARTMENT_OTHER): Payer: Self-pay | Admitting: *Deleted

## 2016-01-14 ENCOUNTER — Emergency Department (HOSPITAL_BASED_OUTPATIENT_CLINIC_OR_DEPARTMENT_OTHER): Payer: Self-pay

## 2016-01-14 ENCOUNTER — Emergency Department (HOSPITAL_BASED_OUTPATIENT_CLINIC_OR_DEPARTMENT_OTHER)
Admission: EM | Admit: 2016-01-14 | Discharge: 2016-01-14 | Disposition: A | Discharge status: Home / Self Care | Payer: Self-pay | Attending: Emergency Medicine | Admitting: Emergency Medicine

## 2016-01-14 DIAGNOSIS — N83209 Unspecified ovarian cyst, unspecified side: Secondary | ICD-10-CM

## 2016-01-14 DIAGNOSIS — R10813 Right lower quadrant abdominal tenderness: Secondary | ICD-10-CM

## 2016-01-14 DIAGNOSIS — N390 Urinary tract infection, site not specified: Secondary | ICD-10-CM | POA: Insufficient documentation

## 2016-01-14 DIAGNOSIS — F17210 Nicotine dependence, cigarettes, uncomplicated: Secondary | ICD-10-CM | POA: Insufficient documentation

## 2016-01-14 DIAGNOSIS — R1031 Right lower quadrant pain: Secondary | ICD-10-CM

## 2016-01-14 DIAGNOSIS — R102 Pelvic and perineal pain: Secondary | ICD-10-CM

## 2016-01-14 DIAGNOSIS — N83291 Other ovarian cyst, right side: Secondary | ICD-10-CM | POA: Insufficient documentation

## 2016-01-14 LAB — CBC WITH DIFFERENTIAL/PLATELET
Basophils Absolute: 0 10*3/uL (ref 0.0–0.1)
Basophils Relative: 0 %
Eosinophils Absolute: 0.2 10*3/uL (ref 0.0–0.7)
Eosinophils Relative: 3 %
HCT: 35.9 % — ABNORMAL LOW (ref 36.0–46.0)
Hemoglobin: 11.7 g/dL — ABNORMAL LOW (ref 12.0–15.0)
Lymphocytes Relative: 31 %
Lymphs Abs: 2.2 10*3/uL (ref 0.7–4.0)
MCH: 24.3 pg — ABNORMAL LOW (ref 26.0–34.0)
MCHC: 32.6 g/dL (ref 30.0–36.0)
MCV: 74.5 fL — ABNORMAL LOW (ref 78.0–100.0)
Monocytes Absolute: 0.8 10*3/uL (ref 0.1–1.0)
Monocytes Relative: 10 %
Neutro Abs: 4 10*3/uL (ref 1.7–7.7)
Neutrophils Relative %: 56 %
Platelets: 233 10*3/uL (ref 150–400)
RBC: 4.82 MIL/uL (ref 3.87–5.11)
RDW: 15.7 % — ABNORMAL HIGH (ref 11.5–15.5)
WBC: 7.2 10*3/uL (ref 4.0–10.5)

## 2016-01-14 LAB — COMPREHENSIVE METABOLIC PANEL
ALT: 18 U/L (ref 14–54)
AST: 15 U/L (ref 15–41)
Albumin: 4 g/dL (ref 3.5–5.0)
Alkaline Phosphatase: 56 U/L (ref 38–126)
Anion gap: 6 (ref 5–15)
BUN: 11 mg/dL (ref 6–20)
CO2: 24 mmol/L (ref 22–32)
Calcium: 9 mg/dL (ref 8.9–10.3)
Chloride: 106 mmol/L (ref 101–111)
Creatinine, Ser: 0.88 mg/dL (ref 0.44–1.00)
GFR calc Af Amer: >60 mL/min (ref >60)
GFR calc non Af Amer: >60 mL/min (ref >60)
Glucose, Bld: 79 mg/dL (ref 65–99)
Potassium: 4.3 mmol/L (ref 3.5–5.1)
Sodium: 136 mmol/L (ref 135–145)
Total Bilirubin: 1.1 mg/dL (ref 0.3–1.2)
Total Protein: 7.3 g/dL (ref 6.5–8.1)

## 2016-01-14 LAB — WET PREP, GENITAL
Clue Cells Wet Prep HPF POC: NONE SEEN
Sperm: NONE SEEN
Trich, Wet Prep: NONE SEEN
Yeast Wet Prep HPF POC: NONE SEEN

## 2016-01-14 LAB — URINE MICROSCOPIC-ADD ON: RBC / HPF: NONE SEEN RBC/hpf (ref 0–5)

## 2016-01-14 LAB — URINALYSIS, ROUTINE W REFLEX MICROSCOPIC
Bilirubin Urine: NEGATIVE
Glucose, UA: NEGATIVE mg/dL
Hgb urine dipstick: NEGATIVE
Ketones, ur: NEGATIVE mg/dL
Leukocytes, UA: NEGATIVE
Nitrite: POSITIVE — AB
Protein, ur: NEGATIVE mg/dL
Specific Gravity, Urine: 1.01 (ref 1.005–1.030)
pH: 5 (ref 5.0–8.0)

## 2016-01-14 LAB — PREGNANCY, URINE: Preg Test, Ur: NEGATIVE

## 2016-01-14 MED ORDER — SODIUM CHLORIDE 0.9 % IV BOLUS (SEPSIS)
1000.0000 mL | Freq: Once | INTRAVENOUS | Status: DC
Start: 2016-01-14 — End: 2016-01-14

## 2016-01-14 MED ORDER — ONDANSETRON HCL 4 MG/2ML IJ SOLN: 4.0000 mg | Freq: Once | INTRAMUSCULAR | Status: DC

## 2016-01-14 MED ORDER — NAPROXEN 500 MG PO TABS: 500.0000 mg | ORAL_TABLET | Freq: Two times a day (BID) | ORAL | 0 refills | Status: DC

## 2016-01-14 MED ORDER — CEPHALEXIN 500 MG PO CAPS: 500.0000 mg | ORAL_CAPSULE | Freq: Two times a day (BID) | ORAL | 0 refills | Status: DC

## 2016-01-14 NOTE — ED Provider Notes (Signed)
[facility] Provider Note   CSN: [id number] Arrival date & time: 01/14/16  1344  First Provider Contact:  4:14 PM  By signing my name below, I, [name], attest that this documentation has been prepared under the direction and in the presence of [employer], PA-C. Electronically Signed: [name], ED Scribe. 01/14/16. 4:27 PM.   History   Chief Complaint Chief Complaint  Patient presents with  . Vaginal Pain    HPI  The history is provided by the patient. No language interpreter was used.  HPI Comments: Patricia Wilcox is a 35 y.o. female with a hx of GERD and cholecystectomy who presents to the Emergency Department complaining of gradually worsening, constant, sharp vaginal pain that began radiating to her right abdomen onset two days ago. She reports associated nausea, vomiting, urinary frequency, pelvic pressure, vaginal discharge that is intermittently streaked with blood, and abnormal vaginal bleeding a few weeks ago when she wiped. 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 She is due to see her PCP for these symptoms. She was seen days ago in the ED for the same and was given antibiotics to treat for possible STDs (Rocephin and Azithromycin). She did not have an US performed. She has taken Tylenol for the pain to no relief. She denies worsening or alleviating factors. She denies fever, chills, SOB, diarrhea, hematochezia, or hematemesis.   Past Medical History:  Diagnosis Date  . Anemia   . Anxiety   . Anxiety   . Depression   . GERD (gastroesophageal reflux disease)   . High cholesterol   . Insomnia   . Palpitation   . PTSD (post-traumatic stress disorder)     Patient Active Problem List   Diagnosis Date Noted  . Chest pain 04/23/2013  . Anemia   . High cholesterol   . Anxiety   . Depression   . Insomnia   . Palpitation   . GERD (gastroesophageal reflux disease)     Past Surgical  History:  Procedure Laterality Date  . BREAST REDUCTION SURGERY    . CHOLECYSTECTOMY    . gallstone removal    . WISDOM TOOTH EXTRACTION      OB History    No data available       Home Medications    Prior to Admission medications   Medication Sig Start Date End Date Taking? Authorizing Provider  cephALEXin (KEFLEX) 500 MG capsule Take 1 capsule (500 mg total) by mouth 2 (two) times daily. 01/14/16   Emi Holes, PA-C  naproxen (NAPROSYN) 500 MG tablet Take 1 tablet (500 mg total) by mouth 2 (two) times daily. 01/14/16   Emi Holes, PA-C  omeprazole (PRILOSEC) 20 MG capsule Take 1 capsule (20 mg total) by mouth daily. 10/17/15   Lona Kettle, PA-C    Family History History reviewed. No pertinent family history.  Social History Social History  Substance Use Topics  . Smoking status: Current Every Day Smoker    Packs/day: 0.25    Types: Cigarettes  . Smokeless tobacco: Never Used  . Alcohol use No     Allergies   Aspirin and Contrast media [iodinated diagnostic agents]   Review of Systems Review of Systems  Constitutional: Negative for chills and fever.  HENT: Negative for facial swelling and sore throat.   Respiratory: Negative for shortness of breath.   Gastrointestinal: Positive for nausea and vomiting. Negative for blood  in stool.  Genitourinary: Positive for frequency, pelvic pain, vaginal bleeding, vaginal discharge and vaginal pain. Negative for dysuria.  Musculoskeletal: Negative for back pain.  Skin: Negative for rash and wound.  Psychiatric/Behavioral: The patient is not nervous/anxious.      Physical Exam Updated Vital Signs BP 141/89 (BP Location: Right Arm)   Pulse 66   Temp 98.6 F (37 C) (Oral)   Resp 18   Ht 5\' 5"  (1.651 m)   Wt 71.2 kg   LMP 12/24/2015 (Approximate)   SpO2 99%   BMI 26.13 kg/m   Physical Exam  Constitutional: She appears well-developed and well-nourished. No distress.  HENT:  Head: Normocephalic and  atraumatic.  Mouth/Throat: Oropharynx is clear and moist. No oropharyngeal exudate.  Eyes: Conjunctivae are normal. Pupils are equal, round, and reactive to light. Right eye exhibits no discharge. Left eye exhibits no discharge. No scleral icterus.  Neck: Normal range of motion. Neck supple. No thyromegaly present.  Cardiovascular: Normal rate, regular rhythm and normal heart sounds.  Exam reveals no gallop and no friction rub.   No murmur heard. Pulmonary/Chest: Effort normal and breath sounds normal. No stridor. No respiratory distress. She has no wheezes. She has no rales.  Abdominal: Soft. Bowel sounds are normal. She exhibits no distension. There is no tenderness. There is tenderness at McBurney's point. There is no rebound and no guarding.  Positive Rovsing's sign; mild right CVA tenderness; no left CVA tenderness  Genitourinary: Pelvic exam was performed with patient supine. There is no rash, tenderness or lesion on the right labia. There is no rash, tenderness or lesion on the left labia. Cervix exhibits motion tenderness and discharge. Right adnexum displays tenderness. Right adnexum displays no mass and no fullness. Left adnexum displays no mass, no tenderness and no fullness. No bleeding in the vagina. Vaginal discharge found.  Genitourinary Comments: Chaperone present; cervix discolored with areas of darker red  Musculoskeletal: She exhibits no edema.  Lumbar spine: Right sided tenderness; no midline tenderness  Lymphadenopathy:    She has no cervical adenopathy.  Neurological: She is alert. Coordination normal.  Skin: Skin is warm and dry. No rash noted. She is not diaphoretic. No pallor.  Psychiatric: She has a normal mood and affect.  Nursing note and vitals reviewed.    ED Treatments / Results  DIAGNOSTIC STUDIES: Oxygen Saturation is 100% on RA, normal by my interpretation.    COORDINATION OF CARE: 4:22 PM-Discussed treatment plan which includes labs, IV fluids, and pelvic  exam with pt at bedside and pt agreed to plan.    Labs (all labs ordered are listed, but only abnormal results are displayed) Labs Reviewed  WET PREP, GENITAL - Abnormal; Notable for the following:       Result Value   WBC, Wet Prep HPF POC MANY (*)    All other components within normal limits  URINALYSIS, ROUTINE W REFLEX MICROSCOPIC (NOT AT [facility]) - Abnormal; Notable for the following:    Color, Urine ORANGE (*)    Nitrite POSITIVE (*)    All other components within normal limits  CBC WITH DIFFERENTIAL/PLATELET - Abnormal; Notable for the following:    Hemoglobin 11.7 (*)    HCT 35.9 (*)    MCV 74.5 (*)    MCH 24.3 (*)    RDW 15.7 (*)    All other components within normal limits  URINE MICROSCOPIC-ADD ON - Abnormal; Notable for the following:    Squamous Epithelial / LPF 0-5 (*)  Bacteria, UA FEW (*)    All other components within normal limits  PREGNANCY, URINE  COMPREHENSIVE METABOLIC PANEL  RPR  HIV ANTIBODY (ROUTINE TESTING)  GC/CHLAMYDIA PROBE AMP ([place]) NOT AT [facility]    EKG  EKG Interpretation None       Radiology US Transvaginal Non-ob  Result Date: 01/14/2016 CLINICAL DATA:  Right lower quadrant pain for 2 days with vaginal bleeding. EXAM: TRANSABDOMINAL AND TRANSVAGINAL ULTRASOUND OF PELVIS DOPPLER ULTRASOUND OF OVARIES TECHNIQUE: Both transabdominal and transvaginal ultrasound examinations of the pelvis were performed. Transabdominal technique was performed for global imaging of the pelvis including uterus, ovaries, adnexal regions, and pelvic cul-de-sac. It was necessary to proceed with endovaginal exam following the transabdominal exam to visualize the uterus, endometrium, and ovaries. Color and duplex Doppler ultrasound was utilized to evaluate blood flow to the ovaries. COMPARISON:  August 25, 2015 FINDINGS: Uterus Measurements: 6.8 x 3.7 x 4.4 cm. The previously seen small fibroid was not visualized today. The patient had difficulty cooperating due to  pain. Endometrium Thickness: 6.7 mm.  No focal abnormality visualized. Right ovary Measurements: 3.6 x 3.2 x 3.4 cm. There is a probable hemorrhagic cyst in the right ovary measuring 2.7 x 2.4 x 2.3 cm. A simple cyst was seen in this region and March of 2017. No solid masses seen in this region on the previous study. Left ovary Measurements: 3.8 x 1.9 x 2.9 cm. There is a probable small corpus luteum cyst on the left. Pulsed Doppler evaluation of both ovaries demonstrates normal low-resistance arterial and venous waveforms. Other findings Trace fluid in the cul-de-sac is likely physiologic IMPRESSION: 1. Probable hemorrhagic cyst in the right ovary. No evidence of torsion. 2. Corpus luteum cyst on the left with no evidence of torsion. 3. No other acute abnormalities. Right lower quadrant pain for 2 days. Vaginal bleeding for several weeks. Please select correct "US Pelvis" template depending on combination of exams performed / being read (e.g. both, Doppler, etc). Electronically Signed   By: Gerome Sam III M.D   On: 01/14/2016 17:55  US Pelvis Complete  Result Date: 01/14/2016 CLINICAL DATA:  Right lower quadrant pain for 2 days with vaginal bleeding. EXAM: TRANSABDOMINAL AND TRANSVAGINAL ULTRASOUND OF PELVIS DOPPLER ULTRASOUND OF OVARIES TECHNIQUE: Both transabdominal and transvaginal ultrasound examinations of the pelvis were performed. Transabdominal technique was performed for global imaging of the pelvis including uterus, ovaries, adnexal regions, and pelvic cul-de-sac. It was necessary to proceed with endovaginal exam following the transabdominal exam to visualize the uterus, endometrium, and ovaries. Color and duplex Doppler ultrasound was utilized to evaluate blood flow to the ovaries. COMPARISON:  August 25, 2015 FINDINGS: Uterus Measurements: 6.8 x 3.7 x 4.4 cm. The previously seen small fibroid was not visualized today. The patient had difficulty cooperating due to pain. Endometrium Thickness: 6.7  mm.  No focal abnormality visualized. Right ovary Measurements: 3.6 x 3.2 x 3.4 cm. There is a probable hemorrhagic cyst in the right ovary measuring 2.7 x 2.4 x 2.3 cm. A simple cyst was seen in this region and March of 2017. No solid masses seen in this region on the previous study. Left ovary Measurements: 3.8 x 1.9 x 2.9 cm. There is a probable small corpus luteum cyst on the left. Pulsed Doppler evaluation of both ovaries demonstrates normal low-resistance arterial and venous waveforms. Other findings Trace fluid in the cul-de-sac is likely physiologic IMPRESSION: 1. Probable hemorrhagic cyst in the right ovary. No evidence of torsion. 2. Corpus luteum cyst on  the left with no evidence of torsion. 3. No other acute abnormalities. Right lower quadrant pain for 2 days. Vaginal bleeding for several weeks. Please select correct "US Pelvis" template depending on combination of exams performed / being read (e.g. both, Doppler, etc). Electronically Signed   By: Gerome Sam III M.D   On: 01/14/2016 17:55  [place] Art/ven Flow Abd Pelv Doppler  Result Date: 01/14/2016 CLINICAL DATA:  Right lower quadrant pain for 2 days with vaginal bleeding. EXAM: TRANSABDOMINAL AND TRANSVAGINAL ULTRASOUND OF PELVIS DOPPLER ULTRASOUND OF OVARIES TECHNIQUE: Both transabdominal and transvaginal ultrasound examinations of the pelvis were performed. Transabdominal technique was performed for global imaging of the pelvis including uterus, ovaries, adnexal regions, and pelvic cul-de-sac. It was necessary to proceed with endovaginal exam following the transabdominal exam to visualize the uterus, endometrium, and ovaries. Color and duplex Doppler ultrasound was utilized to evaluate blood flow to the ovaries. COMPARISON:  August 25, 2015 FINDINGS: Uterus Measurements: 6.8 x 3.7 x 4.4 cm. The previously seen small fibroid was not visualized today. The patient had difficulty cooperating due to pain. Endometrium Thickness: 6.7 mm.  No focal  abnormality visualized. Right ovary Measurements: 3.6 x 3.2 x 3.4 cm. There is a probable hemorrhagic cyst in the right ovary measuring 2.7 x 2.4 x 2.3 cm. A simple cyst was seen in this region and March of 2017. No solid masses seen in this region on the previous study. Left ovary Measurements: 3.8 x 1.9 x 2.9 cm. There is a probable small corpus luteum cyst on the left. Pulsed Doppler evaluation of both ovaries demonstrates normal low-resistance arterial and venous waveforms. Other findings Trace fluid in the cul-de-sac is likely physiologic IMPRESSION: 1. Probable hemorrhagic cyst in the right ovary. No evidence of torsion. 2. Corpus luteum cyst on the left with no evidence of torsion. 3. No other acute abnormalities. Right lower quadrant pain for 2 days. Vaginal bleeding for several weeks. Please select correct "US Pelvis" template depending on combination of exams performed / being read (e.g. both, Doppler, etc). Electronically Signed   By: Gerome Sam III M.D   On: 01/14/2016 17:55   Procedures Procedures (including critical care time)  Medications Ordered in ED Medications - No data to display   Initial Impression / Assessment and Plan / ED Course  I have reviewed the triage vital signs and the nursing notes.  Pertinent labs & imaging results that were available during my care of the patient were reviewed by me and considered in my medical decision making (see chart for details).  Clinical Course      Final Clinical Impressions(s) / ED Diagnoses   Patient with ovarian cyst. CBC shows stable anemia. CMP unremarkable. UA shows positive nitrites and few bacteria. Wet prep shows many WBCs. Patient already treated for GC/Chlamydia this week, both resulted negative. GC/Chlamydia, HIV/RPR sent. Urine Pregnancy negative. Pelvic ultrasound shows probable hemorrhagic cyst in the right ovary, no evidence of torsion; Corpus luteum cyst on the left with no evidence of torsion; No other acute  abnormalities. Patient advised the need to follow up with OBGYN for further evaluation of cyst and for PAP smear for concerning findings on cervix and history of dysplasia. Patient discharged with naproxen and Keflex for UTI. Patient understands and agrees with plan. Patient vitals stable and discharged in satisfactory condition. I discussed patient case with Dr. Clarene Duke who is in agreement with plan.  Final diagnoses:  RLQ abdominal pain  Vaginal pain  RLQ abdominal tenderness  UTI (lower  urinary tract infection)  Hemorrhagic ovarian cyst   I personally performed the services described in this documentation, which was scribed in my presence. The recorded information has been reviewed and is accurate.    New Prescriptions Discharge Medication List as of 01/14/2016  6:52 PM    START taking these medications   Details  cephALEXin (KEFLEX) 500 MG capsule Take 1 capsule (500 mg total) by mouth 2 (two) times daily., Starting Sun 01/14/2016, Print    naproxen (NAPROSYN) 500 MG tablet Take 1 tablet (500 mg total) by mouth 2 (two) times daily., Starting Sun 01/14/2016, Print         Emi Holes, PA-C 01/15/16 1332    Laurence Spates, MD 01/26/16 [phone]

## 2016-01-14 NOTE — ED Notes (Signed)
Attempted IV insertion, was able to obtain blood but not patent IV. Pt refusing fluids and IV at this time.

## 2016-01-14 NOTE — ED Triage Notes (Signed)
Pt reports that she has vaginal pain.  Left AMA yesterday.  States that she had testing done on Friday but was not rx pain medication.  Reports that pain continues.

## 2016-01-14 NOTE — Discharge Instructions (Signed)
Medications: Keflex, Naprosyn  Treatment: Take Keflex twice daily as prescribed for your urinary tract infection. Take Naprosyn twice daily for your pain and ovarian cyst. Make sure to drink plenty of water. You will be called in 2-3 days if any of your cultures return positive. At that time, please seek treatment at the health department or your OB/GYN for further treatment.  Follow-up: Please follow-up with your OB/GYN at your appointment on August 3 for further evaluation and treatment. It is important that you get a Pap smear due to the discoloration ice on your cervix. Please return to the emergency department if you develop any new or worsening symptoms.

## 2016-01-15 LAB — GC/CHLAMYDIA PROBE AMP (~~LOC~~) NOT AT ARMC
Chlamydia: NEGATIVE
Neisseria Gonorrhea: NEGATIVE

## 2016-01-16 LAB — RPR: RPR Ser Ql: NONREACTIVE

## 2016-01-16 LAB — HIV ANTIBODY (ROUTINE TESTING W REFLEX): HIV Screen 4th Generation wRfx: NONREACTIVE

## 2016-01-17 ENCOUNTER — Emergency Department (HOSPITAL_BASED_OUTPATIENT_CLINIC_OR_DEPARTMENT_OTHER)
Admission: EM | Admit: 2016-01-17 | Discharge: 2016-01-17 | Disposition: A | Discharge status: Home / Self Care | Payer: Self-pay | Attending: Emergency Medicine | Admitting: Emergency Medicine

## 2016-01-17 ENCOUNTER — Encounter (HOSPITAL_BASED_OUTPATIENT_CLINIC_OR_DEPARTMENT_OTHER): Payer: Self-pay | Admitting: *Deleted

## 2016-01-17 DIAGNOSIS — F17210 Nicotine dependence, cigarettes, uncomplicated: Secondary | ICD-10-CM | POA: Insufficient documentation

## 2016-01-17 DIAGNOSIS — R109 Unspecified abdominal pain: Secondary | ICD-10-CM | POA: Insufficient documentation

## 2016-01-17 DIAGNOSIS — M549 Dorsalgia, unspecified: Secondary | ICD-10-CM | POA: Insufficient documentation

## 2016-01-17 DIAGNOSIS — F329 Major depressive disorder, single episode, unspecified: Secondary | ICD-10-CM | POA: Insufficient documentation

## 2016-01-17 LAB — CBC WITH DIFFERENTIAL/PLATELET
BASOS ABS: 0 10*3/uL (ref 0.0–0.1)
Basophils Relative: 0 %
EOS PCT: 4 %
Eosinophils Absolute: 0.2 10*3/uL (ref 0.0–0.7)
HCT: 35.4 % — ABNORMAL LOW (ref 36.0–46.0)
HEMOGLOBIN: 11.6 g/dL — AB (ref 12.0–15.0)
Lymphocytes Relative: 44 %
Lymphs Abs: 2.5 10*3/uL (ref 0.7–4.0)
MCH: 24.3 pg — AB (ref 26.0–34.0)
MCHC: 32.8 g/dL (ref 30.0–36.0)
MCV: 74.1 fL — AB (ref 78.0–100.0)
MONO ABS: 0.5 10*3/uL (ref 0.1–1.0)
MONOS PCT: 9 %
NEUTROS PCT: 43 %
Neutro Abs: 2.4 10*3/uL (ref 1.7–7.7)
PLATELETS: 231 10*3/uL (ref 150–400)
RBC: 4.78 MIL/uL (ref 3.87–5.11)
RDW: 15 % (ref 11.5–15.5)
WBC: 5.6 10*3/uL (ref 4.0–10.5)

## 2016-01-17 LAB — BASIC METABOLIC PANEL
ANION GAP: 7 (ref 5–15)
BUN: 7 mg/dL (ref 6–20)
CHLORIDE: 103 mmol/L (ref 101–111)
CO2: 26 mmol/L (ref 22–32)
Calcium: 9 mg/dL (ref 8.9–10.3)
Creatinine, Ser: 0.87 mg/dL (ref 0.44–1.00)
GFR calc Af Amer: >60 mL/min (ref >60)
Glucose, Bld: 109 mg/dL — ABNORMAL HIGH (ref 65–99)
POTASSIUM: 3.4 mmol/L — AB (ref 3.5–5.1)
SODIUM: 136 mmol/L (ref 135–145)

## 2016-01-17 LAB — URINALYSIS, ROUTINE W REFLEX MICROSCOPIC
BILIRUBIN URINE: NEGATIVE
GLUCOSE, UA: NEGATIVE mg/dL
HGB URINE DIPSTICK: NEGATIVE
Ketones, ur: NEGATIVE mg/dL
Leukocytes, UA: NEGATIVE
Nitrite: NEGATIVE
PROTEIN: NEGATIVE mg/dL
Specific Gravity, Urine: 1.016 (ref 1.005–1.030)
pH: 5.5 (ref 5.0–8.0)

## 2016-01-17 LAB — PREGNANCY, URINE: PREG TEST UR: NEGATIVE

## 2016-01-17 MED ORDER — ACETAMINOPHEN 500 MG PO TABS: 500.0000 mg | ORAL_TABLET | Freq: Four times a day (QID) | ORAL | 0 refills | Status: DC | PRN

## 2016-01-17 NOTE — ED Triage Notes (Signed)
Pt c/o back pain x 4 days , seen here 4 times with week for same DX uti

## 2016-01-17 NOTE — Discharge Instructions (Signed)
Continue taking your antibiotic (Keflex) as prescribed. Use the tylenol for pain control. Establish care with a PCP.

## 2016-01-17 NOTE — ED Provider Notes (Signed)
[facility] Note   CSN: [id number] Arrival date & time: 01/17/16  2029  First Provider Contact:  None    By signing my name below, I, Arianna Nassar, attest that this documentation has been prepared under the direction and in the presence of Natalie Mceuen, PA-C.  Electronically Signed: Octavia Heir, ED Scribe. 01/17/16. 9:07 PM.    History   Chief Complaint Chief Complaint  Patient presents with  . Back Pain     The history is provided by the patient. No language interpreter was used.   HPI Comments: Patricia Wilcox is a 35 y.o. female who presents to the Emergency Department complaining of constant, moderate, bilateral flank pain onset 4 days ago. She states that it is her kidneys that are in pain. Pt says she was seen in the ED for the same symptoms in the past week and was diagnosed with a UTI where she was giving Keflex. She reports compliance with her abx since discharge 3 days ago. She states that tylenol alleviates her flank pain "but she doesn't want to be dependent on it". She denies current GU symptoms including suprapubic pain, dysuria, hematuria, malodorous urine, vaginal discharge or pelvic pain. She notes intermittent umbilical pain but states "that's been there a long time". Denies fevers, chills, dizziness, syncope, nausea, vomiting, diarrhea or any other complaints today.   Chart review: patient seen 5 days ago for vaginal pain and treated prophylactically for STDs. She was seen again 3 days ago for vaginal pain, RLQ pain and vaginal discharge. She was diagnosed with UTI and received pelvic US at that time shows hemorrhagic cyst of right ovary and corpus luteum cyst of left ovary.  Past Medical History:  Diagnosis Date  . Anemia   . Anxiety   . Anxiety   . Depression   . GERD (gastroesophageal reflux disease)   . High cholesterol   . Insomnia   . Palpitation   . PTSD (post-traumatic stress disorder)     Patient Active Problem List   Diagnosis Date Noted  . Chest pain 04/23/2013  . Anemia   . High cholesterol   . Anxiety   . Depression   . Insomnia   . Palpitation   . GERD (gastroesophageal reflux disease)     Past Surgical History:  Procedure Laterality Date  . BREAST REDUCTION SURGERY    . CHOLECYSTECTOMY    . gallstone removal    . WISDOM TOOTH EXTRACTION      OB History    No data available       Home Medications    Prior to Admission medications   Medication Sig Start Date End Date Taking? Authorizing Provider  acetaminophen (TYLENOL) 500 MG tablet Take 1 tablet (500 mg total) by mouth every 6 (six) hours as needed. 01/17/16   Tayshun Gappa, PA-C  cephALEXin (KEFLEX) 500 MG capsule Take 1 capsule (500 mg total) by mouth 2 (two) times daily. 01/14/16   Emi Holes, PA-C  naproxen (NAPROSYN) 500 MG tablet Take 1 tablet (500 mg total) by mouth 2 (two) times daily. 01/14/16   Emi Holes, PA-C  omeprazole (PRILOSEC) 20 MG capsule Take 1 capsule (20 mg total) by mouth daily. 10/17/15   Lona Kettle, PA-C    Family History History reviewed. No pertinent family history.  Social History Social History  Substance Use Topics  . Smoking status: Current Every Day Smoker    Packs/day: 0.25    Types: Cigarettes  . Smokeless  tobacco: Never Used  . Alcohol use No     Allergies   Aspirin and Contrast media [iodinated diagnostic agents]   Review of Systems Review of Systems  Gastrointestinal: Negative for nausea and vomiting.  Genitourinary: Positive for flank pain.  Musculoskeletal: Positive for back pain.  All other systems reviewed and are negative.    Physical Exam Updated Vital Signs BP 121/80 (BP Location: Left Arm)   Pulse 73   Temp 98.6 F (37 C) (Oral)   Resp 20   Ht  (1.651 m)   Wt 68 kg   LMP 12/24/2015 (Approximate)   SpO2 100%   BMI 24.96 kg/m   Physical Exam  Constitutional: She is oriented to person, place, and time. She appears well-developed and  well-nourished.  Nontoxic appearing. Texting on cell phone during interview and exam  HENT:  Head: Normocephalic.  Eyes: EOM are normal.  Neck: Normal range of motion. Neck supple.  Cardiovascular: Normal rate, regular rhythm and normal heart sounds.   Pulmonary/Chest: Effort normal and breath sounds normal.  Abdominal: Soft. Bowel sounds are normal. She exhibits no distension. There is no tenderness. There is CVA tenderness. There is no rebound and no guarding.  Abdomen is soft and non-tender. No rebound or guarding. Bilateral CVA tenderness with R > L.   Musculoskeletal: Normal range of motion.  CVA tenderness. No bony tenderness of the T or L spine. FROM of spine intact.   Neurological: She is alert and oriented to person, place, and time.  Psychiatric: She has a normal mood and affect. Her behavior is normal.  Nursing note and vitals reviewed.    ED Treatments / Results  DIAGNOSTIC STUDIES: Oxygen Saturation is 100% on RA, normal by my interpretation.  COORDINATION OF CARE:  9:06 PM Discussed treatment plan which includes urinalysis and check kidney function with pt at bedside and pt agreed to plan.  Labs (all labs ordered are listed, but only abnormal results are displayed) Labs Reviewed  CBC WITH DIFFERENTIAL/PLATELET - Abnormal; Notable for the following:       Result Value   Hemoglobin 11.6 (*)    HCT 35.4 (*)    MCV 74.1 (*)    MCH 24.3 (*)    All other components within normal limits  BASIC METABOLIC PANEL - Abnormal; Notable for the following:    Potassium 3.4 (*)    Glucose, Bld 109 (*)    All other components within normal limits  URINALYSIS, ROUTINE W REFLEX MICROSCOPIC (NOT AT [facility])  PREGNANCY, URINE    EKG  EKG Interpretation None       Radiology No results found.  Procedures Procedures (including critical care time)  Medications Ordered in ED Medications - No data to display   Initial Impression / Assessment and Plan / ED Course  I have  reviewed the triage vital signs and the nursing notes.  Pertinent labs & imaging results that were available during my care of the patient were reviewed by me and considered in my medical decision making (see chart for details).  Clinical Course   35 year old female recently diagnosed with UTI presenting with persistent bilateral flank pain. Patient denies any GU symptoms. Afebrile and hemodynamically stable. Patient is nontoxic appearing. Patient texts on her cell phone and seems disinterested during interview and exam. Abdomen is soft, nontender without peritoneal signs. Bilateral CVA tenderness with right greater than left. No musculoskeletal abnormalities of the thoracic or lumbar back. Patient is ambulatory with a steady gait. Urinalysis is  negative for infection. Hemoglobin at baseline. No leukocytosis. No sign of kidney damage on BMP. Lab work largely unchanged from visit 3 days ago. Patient reports good pain control with Tylenol but has not been taking it regularly. Discussed with patient that this could be residual pain from her recent urinary tract infection getting to her kidneys versus musculoskeletal pain. Encouraged patient to take Tylenol as this provides her good relief. Patient is to follow up with a primary care provider to continue monitoring her symptoms. I do not feel that further workup is indicated at this time. Return precautions given in discharge paperwork and discussed with pt at bedside. Pt stable for discharge   Final Clinical Impressions(s) / ED Diagnoses   Final diagnoses:  Flank pain   I personally performed the services described in this documentation, which was scribed in my presence. The recorded information has been reviewed and is accurate.  New Prescriptions New Prescriptions   ACETAMINOPHEN (TYLENOL) 500 MG TABLET    Take 1 tablet (500 mg total) by mouth every 6 (six) hours as needed.     Rolm Gala Tyeisha Dinan, PA-C 01/17/16 2218    Rolan Bucco, MD 01/17/16  2255

## 2016-01-17 NOTE — ED Notes (Signed)
Pt states, "I'm still having kidney pain."  She is pointing to her stomach as she is texting on her phone.  Pt is not taking anything for her symptoms as she doesn't like "taking too much medicine."  Pt does not appear to be in any discomfort, she has been seen multiple times at this facility for the same thing.  She states she is compliant with her abx at this time.

## 2016-01-17 NOTE — ED Notes (Signed)
Pt verbalizes understanding of d/c instructions and denies any further needs at this time. 

## 2016-01-25 ENCOUNTER — Encounter: Payer: Self-pay | Admitting: Obstetrics and Gynecology

## 2016-01-26 ENCOUNTER — Encounter: Payer: Self-pay | Admitting: Obstetrics and Gynecology

## 2016-01-26 NOTE — Progress Notes (Signed)
Patient did not keep 01/25/2016 GYN visit  Cornelia Copa MD Attending Center for [facility])

## 2016-01-30 ENCOUNTER — Encounter (HOSPITAL_BASED_OUTPATIENT_CLINIC_OR_DEPARTMENT_OTHER): Payer: Self-pay

## 2016-01-30 ENCOUNTER — Emergency Department (HOSPITAL_BASED_OUTPATIENT_CLINIC_OR_DEPARTMENT_OTHER)
Admission: EM | Admit: 2016-01-30 | Discharge: 2016-01-30 | Disposition: A | Discharge status: Home / Self Care | Payer: Self-pay | Attending: Emergency Medicine | Admitting: Emergency Medicine

## 2016-01-30 DIAGNOSIS — K219 Gastro-esophageal reflux disease without esophagitis: Secondary | ICD-10-CM | POA: Insufficient documentation

## 2016-01-30 DIAGNOSIS — R197 Diarrhea, unspecified: Secondary | ICD-10-CM | POA: Insufficient documentation

## 2016-01-30 DIAGNOSIS — F17210 Nicotine dependence, cigarettes, uncomplicated: Secondary | ICD-10-CM | POA: Insufficient documentation

## 2016-01-30 MED ORDER — ONDANSETRON 4 MG PO TBDP: 4.0000 mg | ORAL_TABLET | Freq: Three times a day (TID) | ORAL | 0 refills | Status: DC | PRN

## 2016-01-30 MED ORDER — OMEPRAZOLE 20 MG PO CPDR
20.0000 mg | DELAYED_RELEASE_CAPSULE | Freq: Every day | ORAL | 2 refills | Status: DC
Start: 2016-01-30 — End: 2016-02-07

## 2016-01-30 MED ORDER — ONDANSETRON 4 MG PO TBDP
4.0000 mg | ORAL_TABLET | Freq: Once | ORAL | Status: DC
  Filled 2016-01-30: qty 1

## 2016-01-30 MED ORDER — PANTOPRAZOLE SODIUM 40 MG PO TBEC
40.0000 mg | DELAYED_RELEASE_TABLET | Freq: Every day | ORAL | Status: DC
  Administered 2016-01-30: 40 mg via ORAL
  Filled 2016-01-30: qty 1

## 2016-01-30 MED ORDER — CALCIUM CARBONATE ANTACID 500 MG PO CHEW
2.0000 | CHEWABLE_TABLET | Freq: Once | ORAL | Status: AC
  Administered 2016-01-30: 400 mg via ORAL
  Filled 2016-01-30: qty 2

## 2016-01-30 NOTE — ED Triage Notes (Signed)
CP "burning" x 1.5 hours-NAD-steady gait

## 2016-01-30 NOTE — ED Provider Notes (Signed)
[facility] Provider Note   CSN: [id number] Arrival date & time: 01/30/16  2035  First Provider Contact:  First MD Initiated Contact with Patient 01/30/16 2223      By signing my name below, I, Aggie Moats, attest that this documentation has been prepared under the direction and in the presence of Lilliona Blakeney, PA-C. Electronically signed by: Aggie Moats, ED Scribe. 01/30/16. 10:24 PM.   History   Chief Complaint Chief Complaint  Patient presents with  . Chest Pain     The history is provided by the patient. No language interpreter was used.   HPI Comments:  Patricia Wilcox is a 35 y.o. female who presents to the Emergency Department complaining of mild to moderate chest burning, which started a couple hours ago, radiating from the epigastric region. Pt reports that she doesn't normally eat fried and greasy foods, but ate fried chicken tonight and began to have nonbloody diarrhea approximately 30 minutes later. Pt experienced burning sensation in chest 2 hours after consumption of fried chicken. Associated symptoms include burning sensation in abdomen. Pt has history of GERD and used to take Prilosec, but has not taken it for a couple months. Denies vomiting, fever/chills, shortness breath, or any other complaints.   Past Medical History:  Diagnosis Date  . Anemia   . Anxiety   . Anxiety   . Depression   . GERD (gastroesophageal reflux disease)   . High cholesterol   . Insomnia   . Palpitation   . PTSD (post-traumatic stress disorder)     Patient Active Problem List   Diagnosis Date Noted  . Chest pain 04/23/2013  . Anemia   . High cholesterol   . Anxiety   . Depression   . Insomnia   . Palpitation   . GERD (gastroesophageal reflux disease)     Past Surgical History:  Procedure Laterality Date  . BREAST REDUCTION SURGERY    . CHOLECYSTECTOMY    . gallstone removal    . WISDOM TOOTH EXTRACTION      OB History    No data available       Home  Medications    Prior to Admission medications   Medication Sig Start Date End Date Taking? Authorizing Provider  acetaminophen (TYLENOL) 500 MG tablet Take 1 tablet (500 mg total) by mouth every 6 (six) hours as needed. 01/17/16   Stevi Barrett, PA-C  omeprazole (PRILOSEC) 20 MG capsule Take 1 capsule (20 mg total) by mouth daily. 01/30/16   Amarri Satterly C Adama Ferber, PA-C  ondansetron (ZOFRAN ODT) 4 MG disintegrating tablet Take 1 tablet (4 mg total) by mouth every 8 (eight) hours as needed for nausea or vomiting. 01/30/16   Anselm Pancoast, PA-C    Family History No family history on file.  Social History Social History  Substance Use Topics  . Smoking status: Current Every Day Smoker    Packs/day: 0.25    Types: Cigarettes  . Smokeless tobacco: Never Used  . Alcohol use No     Allergies   Aspirin and Contrast media [iodinated diagnostic agents]   Review of Systems Review of Systems  Respiratory: Negative for chest tightness.   Cardiovascular: Positive for chest pain.       Described as burning, no tingling.  Gastrointestinal: Positive for abdominal pain and diarrhea. Negative for vomiting.  All other systems reviewed and are negative.    Physical Exam Updated Vital Signs BP 107/80 (BP Location: Right Arm)   Pulse (!) 54  Temp 98.9 F (37.2 C) (Oral)   Resp 16   Ht 5\' 5"  (1.651 m)   Wt 150 lb (68 kg)   LMP 01/22/2016   SpO2 100%   BMI 24.96 kg/m   Physical Exam  Constitutional: She appears well-developed and well-nourished. No distress.  HENT:  Head: Normocephalic and atraumatic.  Eyes: Conjunctivae are normal.  Neck: Neck supple.  Cardiovascular: Normal rate, regular rhythm, normal heart sounds and intact distal pulses.   Pulmonary/Chest: Effort normal and breath sounds normal. No respiratory distress.  Abdominal: Soft. There is tenderness. There is no guarding.  Minor epigastric tenderness.   Musculoskeletal: She exhibits no edema or tenderness.  Lymphadenopathy:    She  has no cervical adenopathy.  Neurological: She is alert.  Skin: Skin is warm and dry. She is not diaphoretic.  Psychiatric: She has a normal mood and affect. Her behavior is normal.  Nursing note and vitals reviewed.    ED Treatments / Results  DIAGNOSTIC STUDIES:  Oxygen Saturation is 100% on room air, normal by my interpretation.    COORDINATION OF CARE:  10:28 PM Discussed treatment plan with pt at bedside, which include antacid and anti-nausea medication, and pt agreed to plan.  Labs (all labs ordered are listed, but only abnormal results are displayed) Labs Reviewed - No data to display  EKG  EKG Interpretation  Date/Time:  Tuesday January 30 2016 20:47:39 EDT Ventricular Rate:  63 PR Interval:  172 QRS Duration: 84 QT Interval:  398 QTC Calculation: 407 R Axis:   82 Text Interpretation:  Normal sinus rhythm Normal ECG Since last tracing rate slower Confirmed by Ethelda Chick  MD, SAM ([id number]) on 01/30/2016 10:04:02 PM       Radiology No results found.  Procedures Procedures (including critical care time)  Medications Ordered in ED Medications  calcium carbonate (TUMS - dosed in mg elemental calcium) chewable tablet 400 mg of elemental calcium (400 mg of elemental calcium Oral Given 01/30/16 2254)     Initial Impression / Assessment and Plan / ED Course  I have reviewed the triage vital signs and the nursing notes.  Pertinent labs & imaging results that were available during my care of the patient were reviewed by me and considered in my medical decision making (see chart for details).  Clinical Course    Patricia Wilcox presents with burning in the chest and epigastric region following a greasy meal.  Suspect GI reaction to the fried chicken combined with the patient's GERD. Do not suspect ACS as this would be a very atypical presentation. Patient to follow up with PCP. The patient was given instructions for home care as well as return precautions. Patient  voices understanding of these instructions, accepts the plan, and is comfortable with discharge.  Vitals:   01/30/16 2041 01/30/16 2200  BP: 130/77 107/80  Pulse: 66 (!) 54  Resp: 18 16  Temp: 98.9 F (37.2 C)   TempSrc: Oral   SpO2: 100% 100%  Weight: 68 kg   Height: 5\' 5"  (1.651 m)        Final Clinical Impressions(s) / ED Diagnoses   Final diagnoses:  Gastroesophageal reflux disease, esophagitis presence not specified  Diarrhea, unspecified type    New Prescriptions Discharge Medication List as of 01/30/2016 10:49 PM    START taking these medications   Details  omeprazole (PRILOSEC) 20 MG capsule Take 1 capsule (20 mg total) by mouth daily., Starting Tue 01/30/2016, Print    ondansetron (ZOFRAN ODT) 4  MG disintegrating tablet Take 1 tablet (4 mg total) by mouth every 8 (eight) hours as needed for nausea or vomiting., Starting Tue 01/30/2016, Print      I personally performed the services described in this documentation, which was scribed in my presence. The recorded information has been reviewed and is accurate.    Anselm PancoastShawn C Nikash Mortensen, PA-C 01/31/16 1443    Doug SouSam Jacubowitz, MD 02/01/16 0021

## 2016-01-30 NOTE — Discharge Instructions (Signed)
You have been seen today for burning in the chest and diarrhea. Your symptoms are likely due to the food that you ate immediately prior to the onset of symptoms. Resume taking the Prilosec daily. Take the Prilosec 20-30 minutes prior to the first meal of the day. Use antacids as needed. Follow up with PCP as soon as possible should symptoms continue. Return to ED should symptoms worsen.

## 2016-01-30 NOTE — ED Notes (Signed)
Pt states burning starts into her stomach and goes up into her chest. Pt states her last meal was fried chicken. Pt states she has not been taking any medication for GERD.

## 2016-01-31 ENCOUNTER — Emergency Department (HOSPITAL_BASED_OUTPATIENT_CLINIC_OR_DEPARTMENT_OTHER)
Admission: EM | Admit: 2016-01-31 | Discharge: 2016-01-31 | Disposition: A | Discharge status: Home / Self Care | Payer: Self-pay | Attending: Emergency Medicine | Admitting: Emergency Medicine

## 2016-01-31 ENCOUNTER — Encounter (HOSPITAL_BASED_OUTPATIENT_CLINIC_OR_DEPARTMENT_OTHER): Payer: Self-pay

## 2016-01-31 DIAGNOSIS — F17210 Nicotine dependence, cigarettes, uncomplicated: Secondary | ICD-10-CM | POA: Insufficient documentation

## 2016-01-31 DIAGNOSIS — R1084 Generalized abdominal pain: Secondary | ICD-10-CM | POA: Insufficient documentation

## 2016-01-31 DIAGNOSIS — R102 Pelvic and perineal pain: Secondary | ICD-10-CM | POA: Insufficient documentation

## 2016-01-31 LAB — COMPREHENSIVE METABOLIC PANEL
ALBUMIN: 3.7 g/dL (ref 3.5–5.0)
ALT: 12 U/L — ABNORMAL LOW (ref 14–54)
ANION GAP: 5 (ref 5–15)
AST: 15 U/L (ref 15–41)
Alkaline Phosphatase: 49 U/L (ref 38–126)
BUN: 12 mg/dL (ref 6–20)
CO2: 28 mmol/L (ref 22–32)
Calcium: 8.9 mg/dL (ref 8.9–10.3)
Chloride: 106 mmol/L (ref 101–111)
Creatinine, Ser: 0.78 mg/dL (ref 0.44–1.00)
GFR calc Af Amer: >60 mL/min (ref >60)
GFR calc non Af Amer: >60 mL/min (ref >60)
GLUCOSE: 87 mg/dL (ref 65–99)
POTASSIUM: 3.7 mmol/L (ref 3.5–5.1)
SODIUM: 139 mmol/L (ref 135–145)
TOTAL PROTEIN: 6.9 g/dL (ref 6.5–8.1)
Total Bilirubin: 0.9 mg/dL (ref 0.3–1.2)

## 2016-01-31 LAB — WET PREP, GENITAL
Clue Cells Wet Prep HPF POC: NONE SEEN
SPERM: NONE SEEN
TRICH WET PREP: NONE SEEN
YEAST WET PREP: NONE SEEN

## 2016-01-31 LAB — CBC WITH DIFFERENTIAL/PLATELET
BASOS ABS: 0 10*3/uL (ref 0.0–0.1)
Basophils Relative: 0 %
EOS ABS: 0.1 10*3/uL (ref 0.0–0.7)
Eosinophils Relative: 3 %
HCT: 35.6 % — ABNORMAL LOW (ref 36.0–46.0)
Hemoglobin: 11.7 g/dL — ABNORMAL LOW (ref 12.0–15.0)
LYMPHS ABS: 2.1 10*3/uL (ref 0.7–4.0)
LYMPHS PCT: 46 %
MCH: 24.5 pg — ABNORMAL LOW (ref 26.0–34.0)
MCHC: 32.9 g/dL (ref 30.0–36.0)
MCV: 74.5 fL — ABNORMAL LOW (ref 78.0–100.0)
Monocytes Absolute: 0.5 10*3/uL (ref 0.1–1.0)
Monocytes Relative: 10 %
NEUTROS ABS: 1.8 10*3/uL (ref 1.7–7.7)
Neutrophils Relative %: 41 %
Platelets: 271 10*3/uL (ref 150–400)
RBC: 4.78 MIL/uL (ref 3.87–5.11)
RDW: 14.8 % (ref 11.5–15.5)
WBC: 4.5 10*3/uL (ref 4.0–10.5)

## 2016-01-31 LAB — URINALYSIS, ROUTINE W REFLEX MICROSCOPIC
Bilirubin Urine: NEGATIVE
GLUCOSE, UA: NEGATIVE mg/dL
HGB URINE DIPSTICK: NEGATIVE
Ketones, ur: NEGATIVE mg/dL
LEUKOCYTES UA: NEGATIVE
Nitrite: NEGATIVE
PH: 5.5 (ref 5.0–8.0)
PROTEIN: NEGATIVE mg/dL
SPECIFIC GRAVITY, URINE: 1.022 (ref 1.005–1.030)

## 2016-01-31 LAB — LIPASE, BLOOD: Lipase: 24 U/L (ref 11–51)

## 2016-01-31 LAB — PREGNANCY, URINE: PREG TEST UR: NEGATIVE

## 2016-01-31 NOTE — ED Provider Notes (Signed)
[facility]   CSN: [id number] Arrival date & time: 01/31/16  1646  First Provider Contact:  First MD Initiated Contact with Patient 01/31/16 1704     By signing my name below, I, Emmanuella Mensah, attest that this documentation has been prepared under the direction and in the presence of Roxy Horseman, PA-C. Electronically Signed: Angelene Giovanni, ED Scribe. 01/31/16. 5:19 PM.   History   Chief Complaint Chief Complaint  Patient presents with  . Abdominal Pain   HPI Comments: Patricia Wilcox is a 35 y.o. female with a hx of GERD and high cholesterol who presents to the Emergency Department complaining of gradually worsening moderate tight and then pulling upper abdominal pain onset last night. She reports associated vaginal pain with burning. She notes that these symptoms are consistent with when she had a UTI, stating that her "kidneys hurt too". No alleviating factors noted. Pt has tried to take Tylenol with no relief. She states that she was recently seen on 01/17/16 with these symptoms - except the abdominal pain and was given Tylenol. She denies any vaginal discharge or vaginal odor. No fever, chills, n/v/d, or any urinary symptoms.    The history is provided by the patient. No language interpreter was used.    Past Medical History:  Diagnosis Date  . Anemia   . Anxiety   . Anxiety   . Depression   . GERD (gastroesophageal reflux disease)   . High cholesterol   . Insomnia   . Palpitation   . PTSD (post-traumatic stress disorder)     Patient Active Problem List   Diagnosis Date Noted  . Chest pain 04/23/2013  . Anemia   . High cholesterol   . Anxiety   . Depression   . Insomnia   . Palpitation   . GERD (gastroesophageal reflux disease)     Past Surgical History:  Procedure Laterality Date  . BREAST REDUCTION SURGERY    . CHOLECYSTECTOMY    . gallstone removal    . WISDOM TOOTH EXTRACTION      OB History    No data available       Home Medications    Prior to Admission medications   Medication Sig Start Date End Date Taking? Authorizing Provider  acetaminophen (TYLENOL) 500 MG tablet Take 1 tablet (500 mg total) by mouth every 6 (six) hours as needed. 01/17/16   Stevi Barrett, PA-C  omeprazole (PRILOSEC) 20 MG capsule Take 1 capsule (20 mg total) by mouth daily. 01/30/16   Shawn C Joy, PA-C  ondansetron (ZOFRAN ODT) 4 MG disintegrating tablet Take 1 tablet (4 mg total) by mouth every 8 (eight) hours as needed for nausea or vomiting. 01/30/16   Anselm Pancoast, PA-C    Family History No family history on file.  Social History Social History  Substance Use Topics  . Smoking status: Current Every Day Smoker    Packs/day: 0.25    Types: Cigarettes  . Smokeless tobacco: Never Used  . Alcohol use No     Allergies   Aspirin and Contrast media [iodinated diagnostic agents]   Review of Systems Review of Systems  Constitutional: Negative for chills and fever.  Gastrointestinal: Positive for abdominal pain. Negative for diarrhea, nausea and vomiting.  Genitourinary: Positive for vaginal pain. Negative for dysuria, hematuria, vaginal bleeding and vaginal discharge.     Physical Exam Updated Vital Signs BP 130/85 (BP Location: Left Arm)   Pulse 71   Temp 97.9 F (36.6  C) (Oral)   Resp 18   Ht 5\' 5"  (1.651 m)   Wt 150 lb (68 kg)   LMP 01/22/2016   SpO2 100%   BMI 24.96 kg/m   Physical Exam  Constitutional: She is oriented to person, place, and time. She appears well-developed and well-nourished.  HENT:  Head: Normocephalic and atraumatic.  Eyes: Conjunctivae and EOM are normal. Pupils are equal, round, and reactive to light.  Neck: Normal range of motion. Neck supple.  Cardiovascular: Normal rate and regular rhythm.  Exam reveals no gallop and no friction rub.   No murmur heard. Pulmonary/Chest: Effort normal and breath sounds normal. No respiratory distress. She has no wheezes. She has no rales. She  exhibits no tenderness.  Abdominal: Soft. Bowel sounds are normal. She exhibits no distension and no mass. There is no tenderness. There is no rebound and no guarding.  No focal abdominal tenderness, no RLQ tenderness or pain at McBurney's point, no RUQ tenderness or Murphy's sign, no left-sided abdominal tenderness, no fluid wave, or signs of peritonitis   Genitourinary:  Genitourinary Comments: Pelvic exam chaperoned by female ER tech, no right or left adnexal tenderness, no uterine tenderness, no vaginal discharge, no bleeding, no CMT or friability, no foreign body, no injury to the external genitalia, no other significant findings   Musculoskeletal: Normal range of motion. She exhibits no edema or tenderness.  Neurological: She is alert and oriented to person, place, and time.  Skin: Skin is warm and dry.  Psychiatric: She has a normal mood and affect. Her behavior is normal. Judgment and thought content normal.  Nursing note and vitals reviewed.    ED Treatments / Results  DIAGNOSTIC STUDIES: Oxygen Saturation is 100% on RA, normal by my interpretation.    COORDINATION OF CARE: 5:17 PM- Pt advised of plan for treatment and pt agrees. She will receive lab work for further evaluation.    Labs (all labs ordered are listed, but only abnormal results are displayed) Labs Reviewed  PREGNANCY, URINE  URINALYSIS, ROUTINE W REFLEX MICROSCOPIC (NOT AT [facility])    EKG  EKG Interpretation None       Radiology No results found.  Procedures Procedures (including critical care time)  Medications Ordered in ED Medications - No data to display   Initial Impression / Assessment and Plan / ED Course  Roxy Horseman, PA-C has reviewed the triage vital signs and the nursing notes.  Pertinent labs & imaging results that were available during my care of the patient were reviewed by me and considered in my medical decision making (see chart for details).  Clinical Course    Patient  with abdominal pain and vaginal pain.  Has GERD, was seen recently for the same.  No new changes.  No fevers, vomiting, or focal abdominal tenderness.  Regarding vaginal pain, there is no swelling, discharge, or lesions.  She did have a right sided and left sided cyst 2 weeks ago on [place].  These may still be resolving.  She has follow-up with her OBGYN.  She is generally well appearing and not in any distress.  Laboratory workup is reassuring.  No further workup today.    Final Clinical Impressions(s) / ED Diagnoses   Final diagnoses:  Generalized abdominal pain  Vaginal pain   I personally performed the services described in this documentation, which was scribed in my presence. The recorded information has been reviewed and is accurate.     New Prescriptions New Prescriptions   No medications  on file     Roxy HorsemanRobert Mckinnon Glick, PA-C 01/31/16 Rickey [id number]    Arby BarretteMarcy Pfeiffer, MD 01/31/16 [phone]

## 2016-01-31 NOTE — ED Triage Notes (Addendum)
abd pain, vaginal pain x 2 days-NAD-steady gait-entered triage talking on cell phone

## 2016-02-01 LAB — GC/CHLAMYDIA PROBE AMP (~~LOC~~) NOT AT ARMC
CHLAMYDIA, DNA PROBE: NEGATIVE
Neisseria Gonorrhea: NEGATIVE

## 2016-02-02 LAB — URINE CULTURE: Culture: <10000 — AB

## 2016-02-07 ENCOUNTER — Emergency Department (HOSPITAL_BASED_OUTPATIENT_CLINIC_OR_DEPARTMENT_OTHER)
Admission: EM | Admit: 2016-02-07 | Discharge: 2016-02-07 | Disposition: A | Discharge status: Home / Self Care | Payer: Self-pay | Attending: Emergency Medicine | Admitting: Emergency Medicine

## 2016-02-07 ENCOUNTER — Encounter (HOSPITAL_BASED_OUTPATIENT_CLINIC_OR_DEPARTMENT_OTHER): Payer: Self-pay

## 2016-02-07 DIAGNOSIS — F17210 Nicotine dependence, cigarettes, uncomplicated: Secondary | ICD-10-CM | POA: Insufficient documentation

## 2016-02-07 DIAGNOSIS — K5900 Constipation, unspecified: Secondary | ICD-10-CM | POA: Insufficient documentation

## 2016-02-07 DIAGNOSIS — R34 Anuria and oliguria: Secondary | ICD-10-CM | POA: Insufficient documentation

## 2016-02-07 DIAGNOSIS — R1013 Epigastric pain: Secondary | ICD-10-CM

## 2016-02-07 DIAGNOSIS — N898 Other specified noninflammatory disorders of vagina: Secondary | ICD-10-CM | POA: Insufficient documentation

## 2016-02-07 DIAGNOSIS — R197 Diarrhea, unspecified: Secondary | ICD-10-CM | POA: Insufficient documentation

## 2016-02-07 DIAGNOSIS — R6883 Chills (without fever): Secondary | ICD-10-CM | POA: Insufficient documentation

## 2016-02-07 LAB — CBC WITH DIFFERENTIAL/PLATELET
BASOS PCT: 0 %
Basophils Absolute: 0 10*3/uL (ref 0.0–0.1)
EOS ABS: 0.1 10*3/uL (ref 0.0–0.7)
Eosinophils Relative: 2 %
HEMATOCRIT: 36.5 % (ref 36.0–46.0)
HEMOGLOBIN: 12.2 g/dL (ref 12.0–15.0)
LYMPHS ABS: 2.9 10*3/uL (ref 0.7–4.0)
Lymphocytes Relative: 46 %
MCH: 24.7 pg — ABNORMAL LOW (ref 26.0–34.0)
MCHC: 33.4 g/dL (ref 30.0–36.0)
MCV: 73.9 fL — ABNORMAL LOW (ref 78.0–100.0)
MONOS PCT: 10 %
Monocytes Absolute: 0.6 10*3/uL (ref 0.1–1.0)
NEUTROS ABS: 2.7 10*3/uL (ref 1.7–7.7)
NEUTROS PCT: 42 %
Platelets: 275 10*3/uL (ref 150–400)
RBC: 4.94 MIL/uL (ref 3.87–5.11)
RDW: 15.1 % (ref 11.5–15.5)
WBC: 6.3 10*3/uL (ref 4.0–10.5)

## 2016-02-07 LAB — COMPREHENSIVE METABOLIC PANEL
ALK PHOS: 52 U/L (ref 38–126)
ALT: 14 U/L (ref 14–54)
AST: 15 U/L (ref 15–41)
Albumin: 4 g/dL (ref 3.5–5.0)
Anion gap: 6 (ref 5–15)
BUN: 11 mg/dL (ref 6–20)
CALCIUM: 9.3 mg/dL (ref 8.9–10.3)
CHLORIDE: 105 mmol/L (ref 101–111)
CO2: 26 mmol/L (ref 22–32)
CREATININE: 0.76 mg/dL (ref 0.44–1.00)
GFR calc non Af Amer: >60 mL/min (ref >60)
GLUCOSE: 85 mg/dL (ref 65–99)
Potassium: 4.3 mmol/L (ref 3.5–5.1)
SODIUM: 137 mmol/L (ref 135–145)
Total Bilirubin: 0.9 mg/dL (ref 0.3–1.2)
Total Protein: 7 g/dL (ref 6.5–8.1)

## 2016-02-07 LAB — URINALYSIS, ROUTINE W REFLEX MICROSCOPIC
Bilirubin Urine: NEGATIVE
Glucose, UA: NEGATIVE mg/dL
Hgb urine dipstick: NEGATIVE
KETONES UR: NEGATIVE mg/dL
LEUKOCYTES UA: NEGATIVE
NITRITE: NEGATIVE
PH: 6 (ref 5.0–8.0)
Protein, ur: NEGATIVE mg/dL
SPECIFIC GRAVITY, URINE: 1.022 (ref 1.005–1.030)

## 2016-02-07 LAB — PREGNANCY, URINE: Preg Test, Ur: NEGATIVE

## 2016-02-07 LAB — LIPASE, BLOOD: LIPASE: 22 U/L (ref 11–51)

## 2016-02-07 MED ORDER — OXYCODONE-ACETAMINOPHEN 5-325 MG PO TABS
1.0000 | ORAL_TABLET | Freq: Once | ORAL | Status: DC
  Filled 2016-02-07: qty 1

## 2016-02-07 MED ORDER — FLUCONAZOLE 100 MG PO TABS
200.0000 mg | ORAL_TABLET | Freq: Once | ORAL | Status: AC
  Administered 2016-02-07: 200 mg via ORAL
  Filled 2016-02-07: qty 2

## 2016-02-07 NOTE — ED Provider Notes (Signed)
[facility]   CSN: [id number] Arrival date & time: 02/07/16  1748  By signing my name below, I, Phillis HaggisGabriella Gaje, attest that this documentation has been prepared under the direction and in the presence of No att. providers found. Electronically Signed: Phillis HaggisGabriella Gaje, ED Scribe. 02/07/16. 8:59 PM.  History   Chief Complaint Chief Complaint  Patient presents with  . Flank Pain   The history is provided by the patient. No language interpreter was used.  HPI Comments: Patricia Wilcox is a 35 y.o. Female with a hx of GERD and multiple ED visits who presents to the Emergency Department complaining of gradually worsening epigastric and RLQ, right worse than left, onset 2 days ago. Pt reports associated diarrhea, constipation, thick white discharge two days ago, left flank pain, and decreased urine output. Pt was seen on 02/01/16 for the same, diagnosed with a UTI and prescribed antibiotics. Pt is sexually active monogamously with one woman. She was tested for STDs two weeks ago with negative results. Pt states that she called to follow up with the urologist provided to her, but is unable to be seen for a month. She denies fever, nausea, vomiting, hematuria, or rash.   Past Medical History:  Diagnosis Date  . Anemia   . Anxiety   . Anxiety   . Depression   . GERD (gastroesophageal reflux disease)   . High cholesterol   . Insomnia   . Palpitation   . PTSD (post-traumatic stress disorder)     Patient Active Problem List   Diagnosis Date Noted  . Chest pain 04/23/2013  . Anemia   . High cholesterol   . Anxiety   . Depression   . Insomnia   . Palpitation   . GERD (gastroesophageal reflux disease)     Past Surgical History:  Procedure Laterality Date  . BREAST REDUCTION SURGERY    . CHOLECYSTECTOMY    . gallstone removal    . WISDOM TOOTH EXTRACTION      OB History    No data available       Home Medications    Prior to Admission medications     Not on File    Family History No family history on file.  Social History Social History  Substance Use Topics  . Smoking status: Current Every Day Smoker    Packs/day: 0.25    Types: Cigarettes  . Smokeless tobacco: Never Used  . Alcohol use No     Allergies   Aspirin and Contrast media [iodinated diagnostic agents]   Review of Systems Review of Systems  Constitutional: Positive for chills. Negative for fever.  Gastrointestinal: Positive for abdominal pain, constipation and diarrhea. Negative for nausea and vomiting.  Genitourinary: Positive for decreased urine volume, flank pain and vaginal discharge. Negative for hematuria.  Skin: Negative for rash.  All other systems reviewed and are negative.    Physical Exam Updated Vital Signs BP 121/79 (BP Location: Left Arm)   Pulse 65   Temp 98.8 F (37.1 C) (Oral)   Resp 16   Ht 5\' 5"  (1.651 m)   Wt 150 lb (68 kg)   LMP 01/22/2016   SpO2 100%   BMI 24.96 kg/m   Physical Exam  Constitutional: She is oriented to person, place, and time. She appears well-developed and well-nourished.  HENT:  Head: Normocephalic and atraumatic.  Eyes: EOM are normal. Pupils are equal, round, and reactive to light.  Neck: Normal range of motion. Neck supple.  Cardiovascular: Normal rate, regular rhythm and normal heart sounds.  Exam reveals no gallop and no friction rub.   No murmur heard. Pulmonary/Chest: Effort normal and breath sounds normal. She has no wheezes.  Abdominal: Soft. There is tenderness in the right upper quadrant. There is no rebound, no guarding and no CVA tenderness.  No suprapubic or bilateral lower quadrant tenderness  Musculoskeletal: Normal range of motion.  Neurological: She is alert and oriented to person, place, and time.  Skin: Skin is warm and dry.  Psychiatric: She has a normal mood and affect. Her behavior is normal.  Nursing note and vitals reviewed.    ED Treatments / Results  DIAGNOSTIC  STUDIES: Oxygen Saturation is 100% on RA, normal by my interpretation.    COORDINATION OF CARE: 7:20 PM-Discussed treatment plan which includes labs  with pt at bedside and pt agreed to plan.   Labs (all labs ordered are listed, but only abnormal results are displayed) Labs Reviewed  CBC WITH DIFFERENTIAL/PLATELET - Abnormal; Notable for the following:       Result Value   MCV 73.9 (*)    MCH 24.7 (*)    All other components within normal limits  PREGNANCY, URINE  URINALYSIS, ROUTINE W REFLEX MICROSCOPIC (NOT AT [facility])  LIPASE, BLOOD  COMPREHENSIVE METABOLIC PANEL    EKG  EKG Interpretation None       Radiology No results found.  Procedures Procedures (including critical care time)  Medications Ordered in ED Medications  oxyCODONE-acetaminophen (PERCOCET/ROXICET) 5-325 MG per tablet 1 tablet (not administered)  fluconazole (DIFLUCAN) tablet 200 mg (200 mg Oral Given 02/07/16 1932)     Initial Impression / Assessment and Plan / ED Course  I have reviewed the triage vital signs and the nursing notes.  Pertinent labs & imaging results that were available during my care of the patient were reviewed by me and considered in my medical decision making (see chart for details).  Clinical Course    Upper abdominal pain without signs of peritonitis. Exam otherwise benign. Labs without evidence of acute problem. 16th visit in last 6 months for similar symptoms, suspect possible chronic in nature at this point. Needs close pcp follow up.   Final Clinical Impressions(s) / ED Diagnoses   Final diagnoses:  Epigastric pain   I personally performed the services described in this documentation, which was scribed in my presence. The recorded information has been reviewed and is accurate.   New Prescriptions There are no discharge medications for this patient.    Marily MemosJason Elisabel Hanover, MD 02/07/16 2059

## 2016-02-07 NOTE — ED Notes (Signed)
Edp at bedside discussing test results and dispo instructions. 

## 2016-02-07 NOTE — ED Notes (Signed)
MD at bedside. 

## 2016-02-07 NOTE — ED Triage Notes (Signed)
C/o cont'd bilat flank pain and decreased UO-NAD-steady gait

## 2016-03-13 ENCOUNTER — Encounter (HOSPITAL_BASED_OUTPATIENT_CLINIC_OR_DEPARTMENT_OTHER): Payer: Self-pay

## 2016-03-13 ENCOUNTER — Emergency Department (HOSPITAL_BASED_OUTPATIENT_CLINIC_OR_DEPARTMENT_OTHER)
Admission: EM | Admit: 2016-03-13 | Discharge: 2016-03-13 | Disposition: A | Discharge status: Home / Self Care | Payer: Self-pay | Attending: Emergency Medicine | Admitting: Emergency Medicine

## 2016-03-13 DIAGNOSIS — N760 Acute vaginitis: Secondary | ICD-10-CM | POA: Insufficient documentation

## 2016-03-13 DIAGNOSIS — B9689 Other specified bacterial agents as the cause of diseases classified elsewhere: Secondary | ICD-10-CM

## 2016-03-13 DIAGNOSIS — F17210 Nicotine dependence, cigarettes, uncomplicated: Secondary | ICD-10-CM | POA: Insufficient documentation

## 2016-03-13 LAB — WET PREP, GENITAL
SPERM: NONE SEEN
Trich, Wet Prep: NONE SEEN
YEAST WET PREP: NONE SEEN

## 2016-03-13 LAB — URINALYSIS, ROUTINE W REFLEX MICROSCOPIC
BILIRUBIN URINE: NEGATIVE
Glucose, UA: NEGATIVE mg/dL
HGB URINE DIPSTICK: NEGATIVE
Ketones, ur: NEGATIVE mg/dL
Leukocytes, UA: NEGATIVE
NITRITE: NEGATIVE
PROTEIN: NEGATIVE mg/dL
Specific Gravity, Urine: 1.019 (ref 1.005–1.030)
pH: 6.5 (ref 5.0–8.0)

## 2016-03-13 LAB — PREGNANCY, URINE: PREG TEST UR: NEGATIVE

## 2016-03-13 MED ORDER — METRONIDAZOLE 500 MG PO TABS: 500.0000 mg | ORAL_TABLET | Freq: Two times a day (BID) | ORAL | 0 refills | Status: DC

## 2016-03-13 MED ORDER — METRONIDAZOLE 500 MG PO TABS
500.0000 mg | ORAL_TABLET | Freq: Once | ORAL | Status: AC
  Administered 2016-03-13: 500 mg via ORAL
  Filled 2016-03-13: qty 1

## 2016-03-13 NOTE — Discharge Instructions (Signed)
You were seen in the ED today with vaginal discharge. We found some evidence of bacterial vaginosis. We are treating with antibiotics for 7 days.   Return to the ED with any sudden, severe lower abdominal pain, fever, chills, or other concerning symptoms for you.

## 2016-03-13 NOTE — ED Notes (Signed)
MD at bedside. 

## 2016-03-13 NOTE — ED Provider Notes (Signed)
Emergency Department Provider Note   I have reviewed the triage vital signs and the nursing notes.   HISTORY  Chief Complaint Pelvic Pain   HPI Patricia Wilcox is a 35 y.o. female with PMH of anemia, anxiety, GERD, HLD, and PTSD who presents to the emergency department for evaluation of vaginal discharge. Patient states that over the past 2 days she has had intermittent thick vaginal discharge she notices mostly at night. She denies any sudden onset lower abdominal pain, back pain, dysuria, or vaginal bleeding. Her last menstrual period was approximately one month prior. The patient is sexually active. She states that she recently had an HIV and syphilis test which was negative. She reports pain from ovarian cysts in the past but has not had any of this discomfort recently. She also notes frequent episodes of bacterial vaginosis reports that the discharge seems different. No fevers or shaking chills. No anterior abdominal pain, chest discomfort, difficulty breathing. No exacerbating or alleviating factors.   Past Medical History:  Diagnosis Date  . Anemia   . Anxiety   . Anxiety   . Depression   . GERD (gastroesophageal reflux disease)   . High cholesterol   . Insomnia   . Palpitation   . PTSD (post-traumatic stress disorder)     Patient Active Problem List   Diagnosis Date Noted  . Chest pain 04/23/2013  . Anemia   . High cholesterol   . Anxiety   . Depression   . Insomnia   . Palpitation   . GERD (gastroesophageal reflux disease)     Past Surgical History:  Procedure Laterality Date  . BREAST REDUCTION SURGERY    . CHOLECYSTECTOMY    . gallstone removal    . WISDOM TOOTH EXTRACTION        Allergies Aspirin and Contrast media [iodinated diagnostic agents]  No family history on file.  Social History Social History  Substance Use Topics  . Smoking status: Current Every Day Smoker    Packs/day: 0.25    Types: Cigarettes  . Smokeless tobacco: Never Used   . Alcohol use No    Review of Systems  Constitutional: No fever/chills Eyes: No visual changes. ENT: No sore throat. Cardiovascular: Denies chest pain. Respiratory: Denies shortness of breath. Gastrointestinal: No abdominal pain.  No nausea, no vomiting.  No diarrhea.  No constipation. Genitourinary: Negative for dysuria. Intermittent vaginal discharge.  Musculoskeletal: Negative for back pain. Skin: Negative for rash. Neurological: Negative for headaches, focal weakness or numbness.  10-point ROS otherwise negative.  ____________________________________________   PHYSICAL EXAM:  VITAL SIGNS: ED Triage Vitals  Enc Vitals Group     BP 03/13/16 1620 106/74     Pulse Rate 03/13/16 1620 80     Resp 03/13/16 1620 18     Temp 03/13/16 1620 98.1 F (36.7 C)     Temp Source 03/13/16 1620 Oral     SpO2 03/13/16 1620 100 %     Weight 03/13/16 1621 150 lb (68 kg)     Height 03/13/16 1621 5\' 5"  (1.651 m)     Pain Score 03/13/16 1619 7    Constitutional: Alert and oriented. Well appearing and in no acute distress. Eyes: Conjunctivae are normal.  Head: Atraumatic. Nose: No congestion/rhinnorhea. Mouth/Throat: Mucous membranes are moist.  Oropharynx non-erythematous. Neck: No stridor.   Cardiovascular: Normal rate, regular rhythm. Good peripheral circulation. Grossly normal heart sounds.   Respiratory: Normal respiratory effort.  No retractions. Lungs CTAB. Gastrointestinal: Soft and nontender. No  distention.  Genitourinary: No vaginal bleeding. No CMT. No adnexal fullness or tenderness to palpation. Mild thick, creamy vaginal discharge.  Musculoskeletal: No lower extremity tenderness nor edema. No gross deformities of extremities. Neurologic:  Normal speech and language. No gross focal neurologic deficits are appreciated.  Skin:  Skin is warm, dry and intact. No rash noted. Psychiatric: Mood and affect are normal. Speech and behavior are  normal.  ____________________________________________   LABS (all labs ordered are listed, but only abnormal results are displayed)  Labs Reviewed  WET PREP, GENITAL - Abnormal; Notable for the following:       Result Value   Clue Cells Wet Prep HPF POC PRESENT (*)    WBC, Wet Prep HPF POC MANY (*)    All other components within normal limits  URINALYSIS, ROUTINE W REFLEX MICROSCOPIC (NOT AT [facility])  PREGNANCY, URINE  GC/CHLAMYDIA PROBE AMP ([place]) NOT AT [facility]   ____________________________________________   PROCEDURES  Procedure(s) performed:   Procedures  None ____________________________________________   INITIAL IMPRESSION / ASSESSMENT AND PLAN / ED COURSE  Pertinent labs & imaging results that were available during my care of the patient were reviewed by me and considered in my medical decision making (see chart for details).  Patient resents emergency department for evaluation of vaginal discharge. No sudden onset pain to suggest intermittent ovarian torsion. Patient's urine pregnancy test is negative. She has no evidence of urinary tract infection on UA. Abdomen is soft and nontender. No CVA tenderness. Plan for pelvic exam to better assess her discharge and any focal discomfort or fullness. Have ordered wet prep and STI cultures. Patient reports recent HIV and syphilis testing so will defer these tests for now.  Moderate vaginal discharge. No bleeding. No tenderness to palpation. Patient with BV. Plan to treat and follow with OB/Gyn in the future as needed. Very low suspicion for ovarian cyst or torsion based on exam or history. Discussed return precautions for sudden onset severe lower abdominal pain in detail.   At this time, I do not feel there is any life-threatening condition present. I have reviewed and discussed all results (EKG, imaging, lab, urine as appropriate), exam findings with patient. I have reviewed nursing notes and appropriate previous records.  I  feel the patient is safe to be discharged home without further emergent workup. Discussed usual and customary return precautions. Patient and family (if present) verbalize understanding and are comfortable with this plan.  Patient will follow-up with their primary care provider. If they do not have a primary care provider, information for follow-up has been provided to them. All questions have been answered.  ____________________________________________  FINAL CLINICAL IMPRESSION(S) / ED DIAGNOSES  Final diagnoses:  BV (bacterial vaginosis)     MEDICATIONS GIVEN DURING THIS VISIT:  Medications  metroNIDAZOLE (FLAGYL) tablet 500 mg (500 mg Oral Given 03/13/16 1805)     NEW OUTPATIENT MEDICATIONS STARTED DURING THIS VISIT:  Discharge Medication List as of 03/13/2016  5:55 PM    START taking these medications   Details  metroNIDAZOLE (FLAGYL) 500 MG tablet Take 1 tablet (500 mg total) by mouth 2 (two) times daily., Starting Wed 03/13/2016, Print        Note:  This document was prepared using Dragon voice recognition software and may include unintentional dictation errors.  Alona BeneJoshua Jenniah Bhavsar, MD Emergency Medicine   Maia PlanJoshua G Tennyson Kallen, MD 03/14/16 [phone]

## 2016-03-13 NOTE — ED Triage Notes (Signed)
C/o bilat pelvic pain-hx of ovarian cyst-also c/o vaginal d/c-NAD-steady gait

## 2016-03-14 LAB — GC/CHLAMYDIA PROBE AMP (~~LOC~~) NOT AT ARMC
CHLAMYDIA, DNA PROBE: NEGATIVE
Neisseria Gonorrhea: NEGATIVE

## 2016-04-01 ENCOUNTER — Emergency Department (HOSPITAL_BASED_OUTPATIENT_CLINIC_OR_DEPARTMENT_OTHER)
Admission: EM | Admit: 2016-04-01 | Discharge: 2016-04-01 | Disposition: A | Discharge status: Home / Self Care | Payer: Self-pay | Attending: Emergency Medicine | Admitting: Emergency Medicine

## 2016-04-01 ENCOUNTER — Encounter (HOSPITAL_BASED_OUTPATIENT_CLINIC_OR_DEPARTMENT_OTHER): Payer: Self-pay | Admitting: *Deleted

## 2016-04-01 DIAGNOSIS — R531 Weakness: Secondary | ICD-10-CM | POA: Insufficient documentation

## 2016-04-01 DIAGNOSIS — M5412 Radiculopathy, cervical region: Secondary | ICD-10-CM | POA: Insufficient documentation

## 2016-04-01 DIAGNOSIS — F17210 Nicotine dependence, cigarettes, uncomplicated: Secondary | ICD-10-CM | POA: Insufficient documentation

## 2016-04-01 MED ORDER — PREDNISONE 10 MG PO TABS: 20.0000 mg | ORAL_TABLET | Freq: Two times a day (BID) | ORAL | 0 refills | Status: DC

## 2016-04-01 MED ORDER — CYCLOBENZAPRINE HCL 10 MG PO TABS: 10.0000 mg | ORAL_TABLET | Freq: Two times a day (BID) | ORAL | 0 refills | Status: DC | PRN

## 2016-04-01 NOTE — ED Provider Notes (Signed)
[facility]   CSN: [id number] Arrival date & time: 04/01/16  1641  By signing my name below, I, Teofilo PodMatthew P. Jamison, attest that this documentation has been prepared under the direction and in the presence of Geoffery Lyonsouglas Alzora Ha, MD . Electronically Signed: Teofilo PodMatthew P. Jamison, ED Scribe. 04/01/2016. 6:54 PM.    History   Chief Complaint Chief Complaint  Patient presents with  . Arm Pain    The history is provided by the patient. No language interpreter was used.  Arm Pain  This is a chronic problem. Episode onset: 1 month ago. The problem occurs constantly. The problem has been gradually worsening. The symptoms are aggravated by exertion and stress. Nothing relieves the symptoms. She has tried acetaminophen for the symptoms. The treatment provided no relief.   HPI Comments:  Patricia Wilcox is a 35 y.o. female who presents to the Emergency Department complaining of constant left arm pain x1 month. Pt states that the pain is exacerbated when lifting things and when turning her head. Pt complains of associated weakness to the left arm. Pt states that today the pain began radiating to her left neck and shoulder. Pt has taken tylenol with no relief. Pt denies injury/trauma. Pt denies other associated symptoms.   Past Medical History:  Diagnosis Date  . Anemia   . Anxiety   . Anxiety   . Depression   . GERD (gastroesophageal reflux disease)   . High cholesterol   . Insomnia   . Palpitation   . PTSD (post-traumatic stress disorder)     Patient Active Problem List   Diagnosis Date Noted  . Chest pain 04/23/2013  . Anemia   . High cholesterol   . Anxiety   . Depression   . Insomnia   . Palpitation   . GERD (gastroesophageal reflux disease)     Past Surgical History:  Procedure Laterality Date  . BREAST REDUCTION SURGERY    . CHOLECYSTECTOMY    . gallstone removal    . WISDOM TOOTH EXTRACTION      OB History    No data available       Home  Medications    Prior to Admission medications   Medication Sig Start Date End Date Taking? Authorizing Provider  metroNIDAZOLE (FLAGYL) 500 MG tablet Take 1 tablet (500 mg total) by mouth 2 (two) times daily. 03/13/16   Maia PlanJoshua G Long, MD    Family History No family history on file.  Social History Social History  Substance Use Topics  . Smoking status: Current Every Day Smoker    Packs/day: 0.25    Types: Cigarettes  . Smokeless tobacco: Never Used  . Alcohol use No     Allergies   Aspirin and Contrast media [iodinated diagnostic agents]   Review of Systems Review of Systems  Musculoskeletal: Positive for arthralgias, myalgias and neck pain.  Neurological: Positive for weakness.  All other systems reviewed and are negative.    Physical Exam Updated Vital Signs BP 124/72   Pulse 75   Temp 98 F (36.7 C) (Oral)   Resp 20   LMP 03/19/2016   SpO2 100%   Physical Exam  Constitutional: She appears well-developed and well-nourished. No distress.  HENT:  Head: Normocephalic and atraumatic.  Eyes: Conjunctivae are normal.  Neck:  TTP in the soft tissues of the left neck. Appears to have full ROM without limitation or discomfort.   Cardiovascular: Normal rate.   Pulmonary/Chest: Effort normal.  Abdominal: She exhibits  no distension.  Musculoskeletal:  Strength 5/5 in both upper extremities. Able to flex, extend, and oppose all fingers without difficulty. Ulnar and radial pulses palpable.  Neurological: She is alert.  Sensation intact throughout both hands.   Skin: Skin is warm and dry.  Psychiatric: She has a normal mood and affect.  Nursing note and vitals reviewed.    ED Treatments / Results  DIAGNOSTIC STUDIES:  Oxygen Saturation is 100% on RA, normal by my interpretation.    COORDINATION OF CARE:  6:54 PM Discussed treatment plan with pt at bedside and pt agreed to plan.   Labs (all labs ordered are listed, but only abnormal results are  displayed) Labs Reviewed - No data to display  EKG  EKG Interpretation None       Radiology No results found.  Procedures Procedures (including critical care time)  Medications Ordered in ED Medications - No data to display   Initial Impression / Assessment and Plan / ED Course  I have reviewed the triage vital signs and the nursing notes.  Pertinent labs & imaging results that were available during my care of the patient were reviewed by me and considered in my medical decision making (see chart for details).  Clinical Course    Patient presents with complaints of neck pain radiating into the left arm. I suspect some sort of radiculopathy. This will be treated with prednisone, muscle relaxers, and when necessary follow-up with her primary Dr. if she is not improving in the next week. I see nothing today that would indicate an emergent situation and do not feel as though emergent imaging is indicated.  Final Clinical Impressions(s) / ED Diagnoses   Final diagnoses:  None    New Prescriptions New Prescriptions   No medications on file  I personally performed the services described in this documentation, which was scribed in my presence. The recorded information has been reviewed and is accurate.       Geoffery Lyons, MD 04/01/16 2306

## 2016-04-01 NOTE — Discharge Instructions (Signed)
Prednisone as prescribed.  Flexeril as prescribed as needed for pain.  Follow-up with your primary Dr. in one week if not improving to discuss physical therapy or possible imaging studies.

## 2016-04-01 NOTE — ED Triage Notes (Signed)
Left arm for a month. Unable to turn her head to the left without pain.

## 2016-04-06 ENCOUNTER — Encounter (HOSPITAL_BASED_OUTPATIENT_CLINIC_OR_DEPARTMENT_OTHER): Payer: Self-pay | Admitting: Emergency Medicine

## 2016-04-06 ENCOUNTER — Emergency Department (HOSPITAL_BASED_OUTPATIENT_CLINIC_OR_DEPARTMENT_OTHER)
Admission: EM | Admit: 2016-04-06 | Discharge: 2016-04-06 | Disposition: A | Discharge status: Home / Self Care | Payer: Self-pay | Attending: Emergency Medicine | Admitting: Emergency Medicine

## 2016-04-06 DIAGNOSIS — F17210 Nicotine dependence, cigarettes, uncomplicated: Secondary | ICD-10-CM | POA: Insufficient documentation

## 2016-04-06 DIAGNOSIS — M5412 Radiculopathy, cervical region: Secondary | ICD-10-CM | POA: Insufficient documentation

## 2016-04-06 DIAGNOSIS — R531 Weakness: Secondary | ICD-10-CM | POA: Insufficient documentation

## 2016-04-06 MED ORDER — IBUPROFEN 600 MG PO TABS: 600.0000 mg | ORAL_TABLET | Freq: Four times a day (QID) | ORAL | 0 refills | Status: DC | PRN

## 2016-04-06 NOTE — ED Provider Notes (Signed)
[facility] Provider Note   CSN: [id number] Arrival date & time: 04/06/16  0930     History   Chief Complaint Chief Complaint  Patient presents with  . Weakness  . Arm Pain    HPI Patricia Wilcox is a 35 y.o. female.  RH1    The history is provided by the patient and medical records. No language interpreter was used.  Weakness  Pertinent negatives include no shortness of breath.  Arm Pain  Pertinent negatives include no abdominal pain and no shortness of breath.  Patricia Wilcox is a 35 y.o. female  with a PMH of anxiety, depression, HLD who presents to the Emergency Department complaining of persistent left arm weakness x 1 month. Patient states when she awakens each morning her arm feels numb and difficult to move. Symptoms improve after a few minutes, but she will intermittently feel tingling or weakness throughout the day. She was seen in ED on 10/09 for the same and states symptoms have not improved. She was given rx for prednisone and muscle relaxer but did not get these medications filled because she is "very anxious about taking pills" - no medications taken prior to arrival for these symptoms. No fever, chills, upper back pain, abdominal pain, gait changes, slurred speech or other associated symptoms.    Past Medical History:  Diagnosis Date  . Anemia   . Anxiety   . Anxiety   . Depression   . GERD (gastroesophageal reflux disease)   . High cholesterol   . Insomnia   . Palpitation   . PTSD (post-traumatic stress disorder)     Patient Active Problem List   Diagnosis Date Noted  . Chest pain 04/23/2013  . Anemia   . High cholesterol   . Anxiety   . Depression   . Insomnia   . Palpitation   . GERD (gastroesophageal reflux disease)     Past Surgical History:  Procedure Laterality Date  . BREAST REDUCTION SURGERY    . CHOLECYSTECTOMY    . gallstone removal    . WISDOM TOOTH EXTRACTION      OB History    No data available        Home Medications    Prior to Admission medications   Medication Sig Start Date End Date Taking? Authorizing Provider  cyclobenzaprine (FLEXERIL) 10 MG tablet Take 1 tablet (10 mg total) by mouth 2 (two) times daily as needed for muscle spasms. 04/01/16   Geoffery Lyons, MD  ibuprofen (ADVIL,MOTRIN) 600 MG tablet Take 1 tablet (600 mg total) by mouth every 6 (six) hours as needed. 04/06/16   Dovid Bartko Pilcher Loney Peto, PA-C  metroNIDAZOLE (FLAGYL) 500 MG tablet Take 1 tablet (500 mg total) by mouth 2 (two) times daily. 03/13/16   Maia Plan, MD  predniSONE (DELTASONE) 10 MG tablet Take 2 tablets (20 mg total) by mouth 2 (two) times daily. 04/01/16   Geoffery Lyons, MD    Family History No family history on file.  Social History Social History  Substance Use Topics  . Smoking status: Current Every Day Smoker    Packs/day: 0.25    Types: Cigarettes  . Smokeless tobacco: Never Used  . Alcohol use No     Allergies   Aspirin and Contrast media [iodinated diagnostic agents]   Review of Systems Review of Systems  Constitutional: Negative for fever.  Respiratory: Negative for shortness of breath.   Gastrointestinal: Negative for abdominal pain.  Musculoskeletal: Positive for myalgias and neck  pain.  Neurological: Positive for weakness.     Physical Exam Updated Vital Signs BP 119/69 (BP Location: Right Arm)   Pulse 73   Temp 98.7 F (37.1 C) (Oral)   Resp 16   Ht 5\' 5"  (1.651 m)   Wt 71.7 kg   LMP 03/19/2016   SpO2 99%   BMI 26.29 kg/m   Physical Exam  Constitutional: She is oriented to person, place, and time. She appears well-developed and well-nourished. No distress.  HENT:  Head: Normocephalic and atraumatic.  Neck:  Full range of motion. Tenderness to palpation of the left paraspinal musculature.  Cardiovascular: Normal rate, regular rhythm and normal heart sounds.   No murmur heard. Pulmonary/Chest: Effort normal and breath sounds normal. No respiratory distress.   Musculoskeletal:  Full range of motion without pain. 5/5 muscle strength in upper extremities including grip strength. Motor and sensory intact in radial, ulnar, median nerve distributions. 2+ radial pulses. All compartments soft.  Neurological: She is alert and oriented to person, place, and time.  Skin: Skin is warm and dry.  Psychiatric: She has a normal mood and affect.  Nursing note and vitals reviewed.    ED Treatments / Results  Labs (all labs ordered are listed, but only abnormal results are displayed) Labs Reviewed - No data to display  EKG  EKG Interpretation None       Radiology No results found.  Procedures Procedures (including critical care time)  Medications Ordered in ED Medications - No data to display   Initial Impression / Assessment and Plan / ED Course  I have reviewed the triage vital signs and the nursing notes.  Pertinent labs & imaging results that were available during my care of the patient were reviewed by me and considered in my medical decision making (see chart for details).  Clinical Course   Patricia ParsleyJamie D Wilcox is a 35 y.o. female who presents to ED for persistent left arm numbness/tingling and left-sided neck pain c/w cervical radiculopathy. Seen in ED for similar and given rx for steroids and flexeril however she did not get these medications filled. I agreed that steroids would be the best treatment plan for this patient, however patient states that she is scared of steroids and will not take them. Given high likelihood of medication noncompliance, will treat with ibuprofen instead. I strongly encouraged her to follow up with her primary care provider if her symptoms are not improved by Wednesday. Reasons to return to ED discussed and all questions answered.  Final Clinical Impressions(s) / ED Diagnoses   Final diagnoses:  Cervical radiculopathy    New Prescriptions New Prescriptions   IBUPROFEN (ADVIL,MOTRIN) 600 MG TABLET    Take 1  tablet (600 mg total) by mouth every 6 (six) hours as needed.     [facility] AdolescentsJaime Pilcher Murtaza Shell, PA-C 04/06/16 1011    Vanetta MuldersScott Zackowski, MD 04/07/16 [phone]

## 2016-04-06 NOTE — ED Triage Notes (Addendum)
Pt reports weakness to L arm x a few weeks. Pt states she developed pain to R armpit last night and weakness to R arm. Pt seen here last week for the L arm. Pt given rx and states she did not get it filled.

## 2016-04-06 NOTE — Discharge Instructions (Signed)
It was my pleasure taking care of you today! Take ibuprofen up to three times daily with food as needed. Ice affected area and take warm Epson salt baths for additional relief. If your symptoms are not improved by Wednesday, please see your primary care physician. Return to ER for new or worsening symptoms, any additional concerns.

## 2016-04-15 ENCOUNTER — Emergency Department (HOSPITAL_BASED_OUTPATIENT_CLINIC_OR_DEPARTMENT_OTHER): Payer: Self-pay

## 2016-04-15 ENCOUNTER — Emergency Department (HOSPITAL_BASED_OUTPATIENT_CLINIC_OR_DEPARTMENT_OTHER)
Admission: EM | Admit: 2016-04-15 | Discharge: 2016-04-16 | Disposition: A | Discharge status: Home / Self Care | Payer: Self-pay | Attending: Emergency Medicine | Admitting: Emergency Medicine

## 2016-04-15 ENCOUNTER — Encounter (HOSPITAL_BASED_OUTPATIENT_CLINIC_OR_DEPARTMENT_OTHER): Payer: Self-pay | Admitting: *Deleted

## 2016-04-15 DIAGNOSIS — F17210 Nicotine dependence, cigarettes, uncomplicated: Secondary | ICD-10-CM | POA: Insufficient documentation

## 2016-04-15 DIAGNOSIS — R0789 Other chest pain: Secondary | ICD-10-CM | POA: Insufficient documentation

## 2016-04-15 LAB — BASIC METABOLIC PANEL
Anion gap: 4 — ABNORMAL LOW (ref 5–15)
BUN: 10 mg/dL (ref 6–20)
CALCIUM: 8.9 mg/dL (ref 8.9–10.3)
CHLORIDE: 110 mmol/L (ref 101–111)
CO2: 25 mmol/L (ref 22–32)
CREATININE: 0.69 mg/dL (ref 0.44–1.00)
Glucose, Bld: 77 mg/dL (ref 65–99)
Potassium: 3.7 mmol/L (ref 3.5–5.1)
SODIUM: 139 mmol/L (ref 135–145)

## 2016-04-15 LAB — CBC
HCT: 33.2 % — ABNORMAL LOW (ref 36.0–46.0)
Hemoglobin: 10.8 g/dL — ABNORMAL LOW (ref 12.0–15.0)
MCH: 24.2 pg — ABNORMAL LOW (ref 26.0–34.0)
MCHC: 32.5 g/dL (ref 30.0–36.0)
MCV: 74.4 fL — AB (ref 78.0–100.0)
PLATELETS: 242 10*3/uL (ref 150–400)
RBC: 4.46 MIL/uL (ref 3.87–5.11)
RDW: 14.9 % (ref 11.5–15.5)
WBC: 5.6 10*3/uL (ref 4.0–10.5)

## 2016-04-15 LAB — TROPONIN I

## 2016-04-15 NOTE — ED Notes (Signed)
Pt. Fixing her hair with both arms and hands during assessment and using her L hand and arm to text while assessing her.  Pt. In no distress.  Pt. texting with L hand during RN asking questions of Pt. History.

## 2016-04-15 NOTE — ED Provider Notes (Signed)
[facility]   CSN: [id number] Arrival date & time: 04/15/16  2013  By signing my name below, I, [name], attest that this documentation has been prepared under the direction and in the presence of Doug Sou, MD  Electronically Signed: [name], ED Scribe. 04/15/16. 10:32 PM.   History   Chief Complaint Chief Complaint  Patient presents with  . Chest Pain   The history is provided by the patient. No language interpreter was used.   HPI Comments:  Patricia Wilcox is a 35 y.o. female, with a hx of gallstones, who presents to the Emergency Department complaining of intermittent central chest discomfort x 2 days. She denies current pain. Pt state her pain radiates to her L shoulder. Pt states her chest discomfort episodes last about 10 minutes before self-resolving. Nothing worsens the pain. Symptoms not worse with exertion Pt has taken tylenol with partial relief. She denies SOB and current nausea. Pt is ambulatory without discomfort. Pt is a smoker. She denies alcohol use and drug use. Pt notes family hx of MI and states her mother dies from a MI at 63. Pt is allergic to aspirin. Last menstrual period ended x 1 day and was normal.   Past Medical History:  Diagnosis Date  . Anemia   . Anxiety   . Anxiety   . Depression   . GERD (gastroesophageal reflux disease)   . High cholesterol   . Insomnia   . Palpitation   . PTSD (post-traumatic stress disorder)     Patient Active Problem List   Diagnosis Date Noted  . Chest pain 04/23/2013  . Anemia   . High cholesterol   . Anxiety   . Depression   . Insomnia   . Palpitation   . GERD (gastroesophageal reflux disease)     Past Surgical History:  Procedure Laterality Date  . BREAST REDUCTION SURGERY    . CHOLECYSTECTOMY    . gallstone removal    . WISDOM TOOTH EXTRACTION      OB History    No data available       Home Medications    Prior to Admission medications   Medication  Sig Start Date End Date Taking? Authorizing Provider  cyclobenzaprine (FLEXERIL) 10 MG tablet Take 1 tablet (10 mg total) by mouth 2 (two) times daily as needed for muscle spasms. 04/01/16   Geoffery Lyons, MD  ibuprofen (ADVIL,MOTRIN) 600 MG tablet Take 1 tablet (600 mg total) by mouth every 6 (six) hours as needed. 04/06/16   Jaime Pilcher Ward, PA-C  metroNIDAZOLE (FLAGYL) 500 MG tablet Take 1 tablet (500 mg total) by mouth 2 (two) times daily. 03/13/16   Maia Plan, MD  predniSONE (DELTASONE) 10 MG tablet Take 2 tablets (20 mg total) by mouth 2 (two) times daily. 04/01/16   Geoffery Lyons, MD    Family History No family history on file. Mother had MI age 49 Social History Social History  Substance Use Topics  . Smoking status: Current Every Day Smoker    Packs/day: 0.25    Types: Cigarettes  . Smokeless tobacco: Never Used  . Alcohol use No   Denies drug use  Allergies   Aspirin and Contrast media [iodinated diagnostic agents]   Review of Systems Review of Systems  Constitutional: Positive for diaphoresis.  HENT: Negative.   Respiratory: Negative.  Negative for shortness of breath.   Cardiovascular: Positive for chest pain.  Gastrointestinal: Negative.  Negative for nausea.  Genitourinary:  Last normal menstrual period last week  Musculoskeletal: Negative.   Skin: Negative.   Neurological: Negative.   Psychiatric/Behavioral: Negative.   All other systems reviewed and are negative.    Physical Exam Updated Vital Signs BP 102/68   Pulse 71   Temp 98.1 F (36.7 C) (Oral)   Resp 18   Ht 5\' 5"  (1.651 m)   Wt 158 lb (71.7 kg)   LMP 04/09/2016   SpO2 100%   BMI 26.29 kg/m   Physical Exam  Constitutional: She appears well-developed and well-nourished.  HENT:  Head: Normocephalic and atraumatic.  Eyes: Conjunctivae are normal. Pupils are equal, round, and reactive to light.  Neck: Neck supple. No tracheal deviation present. No thyromegaly present.    Cardiovascular: Normal rate and regular rhythm.   No murmur heard. Pulmonary/Chest: Effort normal and breath sounds normal.  Abdominal: Soft. Bowel sounds are normal. She exhibits no distension. There is no tenderness.  Musculoskeletal: Normal range of motion. She exhibits no edema or tenderness.  Neurological: She is alert. Coordination normal.  Skin: Skin is warm and dry. No rash noted.  Psychiatric: She has a normal mood and affect.  Nursing note and vitals reviewed.    ED Treatments / Results  DIAGNOSTIC STUDIES:  Oxygen Saturation is 100% on RA, normal by my interpretation.    COORDINATION OF CARE:  10:30 PM Discussed treatment plan with pt at bedside and pt agreed to plan.  Labs (all labs ordered are listed, but only abnormal results are displayed) Labs Reviewed  BASIC METABOLIC PANEL  CBC  TROPONIN I    EKG  EKG Interpretation  Date/Time:  Monday April 15 2016 20:20:31 EDT Ventricular Rate:  69 PR Interval:  174 QRS Duration: 100 QT Interval:  368 QTC Calculation: 394 R Axis:   72 Text Interpretation:  Normal sinus rhythm Nonspecific T wave abnormality Abnormal ECG No significant change since last tracing Confirmed by Ethelda ChickJACUBOWITZ  MD, Kerrilyn Azbill [phone]) on 04/15/2016 8:25:28 PM       Radiology No results found.  Procedures Procedures (including critical care time)  Medications Ordered in ED Medications - No data to display  Results for orders placed or performed during the hospital encounter of 04/15/16  Basic metabolic panel  Result Value Ref Range   Sodium 139 135 - 145 mmol/L   Potassium 3.7 3.5 - 5.1 mmol/L   Chloride 110 101 - 111 mmol/L   CO2 25 22 - 32 mmol/L   Glucose, Bld 77 65 - 99 mg/dL   BUN 10 6 - 20 mg/dL   Creatinine, Ser 1.910.69 0.44 - 1.00 mg/dL   Calcium 8.9 8.9 - 47.810.3 mg/dL   GFR calc non Af Amer >60 >60 mL/min   GFR calc Af Amer >60 >60 mL/min   Anion gap 4 (L) 5 - 15  CBC  Result Value Ref Range   WBC 5.6 4.0 - 10.5 K/uL   RBC  4.46 3.87 - 5.11 MIL/uL   Hemoglobin 10.8 (L) 12.0 - 15.0 g/dL   HCT 29.533.2 (L) 62.136.0 - 30.846.0 %   MCV 74.4 (L) 78.0 - 100.0 fL   MCH 24.2 (L) 26.0 - 34.0 pg   MCHC 32.5 30.0 - 36.0 g/dL   RDW 65.714.9 84.611.5 - 96.215.5 %   Platelets 242 150 - 400 K/uL  Troponin I  Result Value Ref Range   Troponin I <0.03 <0.03 ng/mL   Dg Chest 2 View  Result Date: 04/15/2016 CLINICAL DATA:  35 year old female with chest pain  EXAM: CHEST  2 VIEW COMPARISON:  Chest radiograph dated 01/19/2016 FINDINGS: The heart size and mediastinal contours are within normal limits. Both lungs are clear. The visualized skeletal structures are unremarkable. Right upper quadrant cholecystectomy clips. IMPRESSION: No active cardiopulmonary disease. Electronically Signed   By: Elgie Collard M.D.   On: 04/15/2016 22:57   Initial Impression / Assessment and Plan / ED Course  I have reviewed the triage vital signs and the nursing notes.  Pertinent labs & imaging results that were available during my care of the patient were reviewed by me and considered in my medical decision making (see chart for details).  Clinical Course    Cardiac risk factors include smoking and family history. 11:30 PM patient asymptomatic. I counseled patient for 5 minutes on smoking cessation Pt signed out to Dr. Nicanor Alcon 1130 pm  Final Clinical Impressions(s) / ED Diagnoses  Dx #1 atypical chest pain #2 tobacco abuse Final diagnoses:  None   I personally performed the services described in this documentation, which was scribed in my presence. The recorded information has been reviewed and considered. New Prescriptions New Prescriptions   No medications on file       Doug Sou, MD 04/16/16 1220

## 2016-04-15 NOTE — ED Triage Notes (Signed)
Pain in the center of her chest since yesterday. Radiation into her left shoulder.

## 2016-04-15 NOTE — ED Notes (Signed)
Pt. Asking why she is still waiting to see the Dr.  /  RN explained to Pt. She will be seen as soon as possible.  RN explained we have 12 rooms and 2 providers and sufficient staff to care for her needs and the needs of anyone who comes to our ED.  Pt. Questioning her diagnosis so she can be relieved from work Charity fundraiserN explained that is between her and the Dr.  Theola SequinN gives no diagnosis and makes no diagnosis.

## 2016-04-15 NOTE — ED Notes (Signed)
Acknowledged orders then Pt. Went to radiology.  Dr. Ethelda ChickJacubowitz will see Pt. Before RN starts any orders on pt.

## 2016-04-16 LAB — TROPONIN I: Troponin I: <0.03 ng/mL (ref <0.03)

## 2016-04-16 NOTE — ED Notes (Signed)
Friend at bedside.

## 2016-04-16 NOTE — ED Notes (Signed)
Pt. Was told by Dr. Ethelda ChickJacubowitz about another troponin will be drawn at 01:30am and she was told that she would be seen by the on coming EDP Dr. Nicanor AlconPalumbo.  Pt. Called RN Earlene PlaterDavis to room saying this is not normal procedure for troponins to be drawn this many hours apart.  RN explained that yes it is normal procedure and it was explained to her by the EDP before he left.  Pt. Loud and fussy about wanting to leave before having the next troponin drawn.  Pt. Said she has rights Charity fundraiserN said OK you can sign out AMA and that will be fine.  Pt. Became louder saying she had a right to speak her mind RN agreed .... Pt. Said that RN Earlene PlaterDavis was rude to her ... The door was open the entire conversation and staff heard the entire exchange between the Pt. And myself.  Timmie FoersterKelli Neal RN, Meta HatchetSteve RT, Caffie PintoSara RN, Morgan EMT.

## 2016-04-16 NOTE — ED Notes (Signed)
Pt verbalizes understanding of d/c instructions and denies any further needs at this time. 

## 2016-04-19 ENCOUNTER — Encounter (HOSPITAL_BASED_OUTPATIENT_CLINIC_OR_DEPARTMENT_OTHER): Payer: Self-pay | Admitting: *Deleted

## 2016-04-19 ENCOUNTER — Emergency Department (HOSPITAL_BASED_OUTPATIENT_CLINIC_OR_DEPARTMENT_OTHER)
Admission: EM | Admit: 2016-04-19 | Discharge: 2016-04-19 | Disposition: A | Discharge status: Home / Self Care | Payer: Self-pay | Attending: Emergency Medicine | Admitting: Emergency Medicine

## 2016-04-19 DIAGNOSIS — X500XXA Overexertion from strenuous movement or load, initial encounter: Secondary | ICD-10-CM | POA: Insufficient documentation

## 2016-04-19 DIAGNOSIS — F17210 Nicotine dependence, cigarettes, uncomplicated: Secondary | ICD-10-CM | POA: Insufficient documentation

## 2016-04-19 DIAGNOSIS — Y999 Unspecified external cause status: Secondary | ICD-10-CM | POA: Insufficient documentation

## 2016-04-19 DIAGNOSIS — M79601 Pain in right arm: Secondary | ICD-10-CM

## 2016-04-19 DIAGNOSIS — S56211A Strain of other flexor muscle, fascia and tendon at forearm level, right arm, initial encounter: Secondary | ICD-10-CM | POA: Insufficient documentation

## 2016-04-19 DIAGNOSIS — Y929 Unspecified place or not applicable: Secondary | ICD-10-CM | POA: Insufficient documentation

## 2016-04-19 DIAGNOSIS — Y939 Activity, unspecified: Secondary | ICD-10-CM | POA: Insufficient documentation

## 2016-04-19 NOTE — ED Notes (Signed)
C/o rt lower arm pain x 5 hours  Denies inj

## 2016-04-19 NOTE — ED Triage Notes (Signed)
Pt c/o right lower arm pain increased with movt.

## 2016-04-19 NOTE — Discharge Instructions (Signed)
Take ibuprofen 800mg  three times daily with food, as directed for inflammation and pain with tylenol for breakthrough pain. Use heat to areas of soreness, no more than 20 minutes at a time every hour. Avoid heavy lifting or repetitive movements with your forearm for the next 1 week. Follow up with primary care physician for recheck of ongoing symptoms in the next 1-2 weeks. Return to ER for emergent changing or worsening of symptoms.

## 2016-04-19 NOTE — ED Provider Notes (Signed)
[facility] Provider Note   CSN: [id number] Arrival date & time: 04/19/16  2040  By signing my name below, I, Christy Sartorius, attest that this documentation has been prepared under the direction and in the presence of Ramal Eckhardt Camprubi-Soms, PA-C. Electronically Signed: Christy Sartorius, ED Scribe. 04/19/16. 10:00 PM.  History   Chief Complaint Chief Complaint  Patient presents with  . Arm Pain   The history is provided by the patient and medical records. No language interpreter was used.  Arm Pain  This is a recurrent problem. The current episode started 6 to 12 hours ago. The problem occurs hourly (intermittently). The problem has been resolved. Pertinent negatives include no chest pain, no abdominal pain and no shortness of breath. Exacerbated by: use of R arm. Nothing relieves the symptoms. She has tried nothing for the symptoms. The treatment provided no relief.     HPI Comments:  Patricia Wilcox is a 35 y.o. female who presents to the Emergency Department complaining of right forearm pain onset 1600 today (6hrs prior to exam), which resolved approx 1hr PTA.  Pt describes her pain as 9/10 initially but 0/10 currently, Intermittent, non radiating sharp ("like muscle cramping") R forearm pain, worse with lifting objects, and with no tx tried PTA but currently resolved.  Her pain began while she was at rest. She adds that she has generalized muscle aches/twitching/spasms for a while, and thinks that "something is going on with my muscles".   Pt works a Health and safety inspector job, does some typing throughout the day, denies heavy lifting or other repetitive movements at [employer].  She denies injury, skin redness, warmth, swelling, bruising, fever, chills,chest pain, SOB, abdominal pain, nausea, vomiting,diarrhea, constipation, dysuria, hematuria, rashes, numbness, tingling, or focal weakness, or any other associated symptoms.   Past Medical History:  Diagnosis Date  . Anemia   .  Anxiety   . Anxiety   . Depression   . GERD (gastroesophageal reflux disease)   . High cholesterol   . Insomnia   . Palpitation   . PTSD (post-traumatic stress disorder)     Patient Active Problem List   Diagnosis Date Noted  . Chest pain 04/23/2013  . Anemia   . High cholesterol   . Anxiety   . Depression   . Insomnia   . Palpitation   . GERD (gastroesophageal reflux disease)     Past Surgical History:  Procedure Laterality Date  . BREAST REDUCTION SURGERY    . CHOLECYSTECTOMY    . gallstone removal    . WISDOM TOOTH EXTRACTION      OB History    No data available       Home Medications    Prior to Admission medications   Medication Sig Start Date End Date Taking? Authorizing Provider  cyclobenzaprine (FLEXERIL) 10 MG tablet Take 1 tablet (10 mg total) by mouth 2 (two) times daily as needed for muscle spasms. 04/01/16   Geoffery Lyons, MD  ibuprofen (ADVIL,MOTRIN) 600 MG tablet Take 1 tablet (600 mg total) by mouth every 6 (six) hours as needed. 04/06/16   Jaime Pilcher Ward, PA-C  metroNIDAZOLE (FLAGYL) 500 MG tablet Take 1 tablet (500 mg total) by mouth 2 (two) times daily. 03/13/16   Maia Plan, MD  predniSONE (DELTASONE) 10 MG tablet Take 2 tablets (20 mg total) by mouth 2 (two) times daily. 04/01/16   Geoffery Lyons, MD    Family History History reviewed. No pertinent family history.  Social History Social History  Substance  Use Topics  . Smoking status: Current Every Day Smoker    Packs/day: 0.25    Types: Cigarettes  . Smokeless tobacco: Never Used  . Alcohol use No     Allergies   Aspirin and Contrast media [iodinated diagnostic agents]   Review of Systems Review of Systems  Constitutional: Negative for chills and fever.  HENT: Negative for tinnitus.   Respiratory: Negative for shortness of breath.   Cardiovascular: Negative for chest pain.  Gastrointestinal: Negative for abdominal pain, constipation, diarrhea, nausea and vomiting.    Genitourinary: Negative for dysuria and hematuria.  Musculoskeletal: Positive for arthralgias (R forearm) and myalgias (R forearm). Negative for joint swelling.  Skin: Negative for color change and wound.  Allergic/Immunologic: Negative for immunocompromised state.  Neurological: Negative for weakness and numbness.  Psychiatric/Behavioral: Negative for confusion.   10 systems reviewed and all are negative for acute change except as noted in the HPI.  Physical Exam Updated Vital Signs BP 116/71   Pulse 71   Temp 98.7 F (37.1 C)   Resp 16   LMP 04/09/2016   SpO2 100%   Physical Exam  Constitutional: She is oriented to person, place, and time. Vital signs are normal. She appears well-developed and well-nourished.  Non-toxic appearance. No distress.  Afebrile, nontoxic, NAD  HENT:  Head: Normocephalic and atraumatic.  Mouth/Throat: Mucous membranes are normal.  Eyes: Conjunctivae and EOM are normal. Right eye exhibits no discharge. Left eye exhibits no discharge.  Neck: Normal range of motion. Neck supple.  Cardiovascular: Normal rate and intact distal pulses.   Pulmonary/Chest: Effort normal. No respiratory distress.  Abdominal: Normal appearance. She exhibits no distension.  Musculoskeletal: Normal range of motion.       Right forearm: She exhibits tenderness. She exhibits no bony tenderness, no swelling, no edema, no deformity and no laceration.  Right forearm with FROM intact in all joints, with mild TTP to the forearm flexor musculature, no focal bony TTP, no bruising or erythema, no warmth, no swelling, no crepitus or deformity, strength and sensation grossly intact, distal pulse intact, soft compartments.    Neurological: She is alert and oriented to person, place, and time. She has normal strength. No sensory deficit.  Skin: Skin is warm, dry and intact. No rash noted.  Psychiatric: She has a normal mood and affect. Her behavior is normal.  Nursing note and vitals  reviewed.    ED Treatments / Results   DIAGNOSTIC STUDIES:  Oxygen Saturation is 100% on RA, NML by my interpretation.    COORDINATION OF CARE:  9:55 PM Discussed treatment plan with pt at bedside and pt agreed to plan.  Labs (all labs ordered are listed, but only abnormal results are displayed) Labs Reviewed - No data to display  EKG  EKG Interpretation None       Radiology No results found.  Procedures Procedures (including critical care time)  Medications Ordered in ED Medications - No data to display   Initial Impression / Assessment and Plan / ED Course  I have reviewed the triage vital signs and the nursing notes.  Pertinent labs & imaging results that were available during my care of the patient were reviewed by me and considered in my medical decision making (see chart for details).  Clinical Course    35 y.o. female here with R forearm pain x6hrs, no specific injury, describes a spasm. NVI with soft compartments, tenderness to flexor muscles, no bony TTP. No indication for imaging, discussed that it's likely  a muscle strain from repetitive movements at her job. Discussed RICE/heat use/tylenol/scheduled ibuprofen, and f/up with PCP in 1wk. I explained the diagnosis and have given explicit precautions to return to the ER including for any other new or worsening symptoms. The patient understands and accepts the medical plan as it's been dictated and I have answered their questions. Discharge instructions concerning home care and prescriptions have been given. The patient is STABLE and is discharged to home in good condition.   Final Clinical Impressions(s) / ED Diagnoses   Final diagnoses:  Right arm pain  Strain of other flexor muscle, fascia and tendon at forearm level, right arm, initial encounter    New Prescriptions New Prescriptions   No medications on file   I personally performed the services described in this documentation, which was scribed in  my presence. The recorded information has been reviewed and is accurate.    France Ravens Camprubi-Soms, PA-C 04/19/16 2205    Laurence Spates, MD 04/24/16 [phone]

## 2016-05-02 ENCOUNTER — Encounter (HOSPITAL_BASED_OUTPATIENT_CLINIC_OR_DEPARTMENT_OTHER): Payer: Self-pay

## 2016-05-02 ENCOUNTER — Emergency Department (HOSPITAL_BASED_OUTPATIENT_CLINIC_OR_DEPARTMENT_OTHER)
Admission: EM | Admit: 2016-05-02 | Discharge: 2016-05-02 | Disposition: A | Discharge status: Home / Self Care | Payer: Self-pay | Attending: Emergency Medicine | Admitting: Emergency Medicine

## 2016-05-02 DIAGNOSIS — R51 Headache: Secondary | ICD-10-CM | POA: Insufficient documentation

## 2016-05-02 DIAGNOSIS — R519 Headache, unspecified: Secondary | ICD-10-CM

## 2016-05-02 DIAGNOSIS — F17210 Nicotine dependence, cigarettes, uncomplicated: Secondary | ICD-10-CM | POA: Insufficient documentation

## 2016-05-02 LAB — URINALYSIS, ROUTINE W REFLEX MICROSCOPIC
Bilirubin Urine: NEGATIVE
Glucose, UA: NEGATIVE mg/dL
Hgb urine dipstick: NEGATIVE
KETONES UR: NEGATIVE mg/dL
LEUKOCYTES UA: NEGATIVE
NITRITE: NEGATIVE
PH: 6.5 (ref 5.0–8.0)
Protein, ur: NEGATIVE mg/dL
SPECIFIC GRAVITY, URINE: 1.021 (ref 1.005–1.030)

## 2016-05-02 LAB — PREGNANCY, URINE: Preg Test, Ur: NEGATIVE

## 2016-05-02 MED ORDER — DIPHENHYDRAMINE HCL 50 MG/ML IJ SOLN
25.0000 mg | Freq: Once | INTRAMUSCULAR | Status: AC
  Administered 2016-05-02: 25 mg via INTRAVENOUS
  Filled 2016-05-02: qty 1

## 2016-05-02 MED ORDER — SODIUM CHLORIDE 0.9 % IV BOLUS (SEPSIS)
1000.0000 mL | Freq: Once | INTRAVENOUS | Status: AC
  Administered 2016-05-02: 1000 mL via INTRAVENOUS

## 2016-05-02 MED ORDER — ACETAMINOPHEN 500 MG PO TABS
500.0000 mg | ORAL_TABLET | Freq: Once | ORAL | Status: AC
  Administered 2016-05-02: 500 mg via ORAL
  Filled 2016-05-02: qty 1

## 2016-05-02 MED ORDER — NAPROXEN 375 MG PO TABS: 375.0000 mg | ORAL_TABLET | Freq: Two times a day (BID) | ORAL | 0 refills | Status: DC

## 2016-05-02 NOTE — ED Provider Notes (Signed)
[facility]   CSN: [id number] Arrival date & time: 05/02/16  1518    History   Chief Complaint Chief Complaint  Patient presents with  . Headache    HPI   Patricia Wilcox is a 35 y.o. female with past medical history of anxiety, depression, PTSD, insomnia who presents with a headache. Patient states that the headache has been intermittent for the last couple weeks and she feels it behind her left eye. She describes the pain as a "cluster of stabbing". Patient believes that this pain is secondary to wearing false eyelashes which she thinks infected her eye. She went to the doctor for this before and was given anti-inflammatory medications. Patient denies any weakness, numbness, fever, back pain. Denies any sick contacts. Denies any eye redness or drainage. Admits to some blurry vision in the past but not currently. Endorses some dysuria and pelvic pain as well.   Of note, patient admits to hx of migraines since she was 35 years old. Does not follow up with a neurologist and does not take any migraine medications other than OTC Tylenol PRN.    Past Medical History:  Diagnosis Date  . Anemia   . Anxiety   . Anxiety   . Depression   . GERD (gastroesophageal reflux disease)   . High cholesterol   . Insomnia   . Palpitation   . PTSD (post-traumatic stress disorder)     Patient Active Problem List   Diagnosis Date Noted  . Chest pain 04/23/2013  . Anemia   . High cholesterol   . Anxiety   . Depression   . Insomnia   . Palpitation   . GERD (gastroesophageal reflux disease)     Past Surgical History:  Procedure Laterality Date  . BREAST REDUCTION SURGERY    . CHOLECYSTECTOMY    . gallstone removal    . WISDOM TOOTH EXTRACTION      OB History    No data available       Home Medications    Prior to Admission medications   Medication Sig Start Date End Date Taking? Authorizing Provider  naproxen (NAPROSYN) 375 MG tablet Take 1 tablet (375  mg total) by mouth 2 (two) times daily with a meal. 05/02/16   Beaulah Dinninghristina M Roch Quach, MD    Family History No family history on file.  Social History Social History  Substance Use Topics  . Smoking status: Current Every Day Smoker    Packs/day: 0.25    Types: Cigarettes  . Smokeless tobacco: Never Used  . Alcohol use No     Allergies   Aspirin and Contrast media [iodinated diagnostic agents]   Review of Systems Review of Systems  10 Systems reviewed and are negative for acute change except as noted in the HPI.   Physical Exam Updated Vital Signs BP 121/71 (BP Location: Left Arm)   Pulse 63   Temp 98 F (36.7 C) (Oral)   Resp 18   Ht 5\' 5"  (1.651 m)   Wt 73.2 kg   LMP 04/10/2016   SpO2 100%   BMI 26.86 kg/m   Physical Exam  Constitutional: She is oriented to person, place, and time. She appears well-developed and well-nourished. No distress.  HENT:  Head: Normocephalic and atraumatic.  Right Ear: External ear normal.  Left Ear: External ear normal.  Nose: Nose normal.  Mouth/Throat: Oropharynx is clear and moist.  Eyes: Conjunctivae and EOM are normal. Pupils are equal, round, and reactive to  light. Right eye exhibits no discharge. Left eye exhibits no discharge. No scleral icterus.  Neck: Normal range of motion. Neck supple.  Cardiovascular: Normal rate, regular rhythm, normal heart sounds and intact distal pulses.   Pulmonary/Chest: Effort normal and breath sounds normal. No respiratory distress. She has no wheezes.  Abdominal: Soft. Bowel sounds are normal. There is no tenderness.  Musculoskeletal: Normal range of motion. She exhibits no tenderness.  Lymphadenopathy:    She has no cervical adenopathy.  Neurological: She is alert and oriented to person, place, and time. She has normal strength. She displays normal reflexes. No cranial nerve deficit or sensory deficit. She exhibits normal muscle tone. She displays no seizure activity. Coordination and gait normal.   Skin: Skin is warm and dry. Capillary refill takes less than 2 seconds.  Psychiatric: She has a normal mood and affect.     ED Treatments / Results  Labs (all labs ordered are listed, but only abnormal results are displayed) Labs Reviewed  URINALYSIS, ROUTINE W REFLEX MICROSCOPIC (NOT [facility])  PREGNANCY, URINE    EKG  EKG Interpretation None       Radiology No results found.  Procedures Procedures (including critical care time)  Medications Ordered in ED Medications  sodium chloride 0.9 % bolus 1,000 mL (0 mLs Intravenous Stopped 05/02/16 1702)  acetaminophen (TYLENOL) tablet 500 mg (500 mg Oral Given 05/02/16 1628)  diphenhydrAMINE (BENADRYL) injection 25 mg (25 mg Intravenous Given 05/02/16 1627)     Initial Impression / Assessment and Plan / ED Course  I have reviewed the triage vital signs and the nursing notes.  Pertinent labs & imaging results that were available during my care of the patient were reviewed by me and considered in my medical decision making (see chart for details).  Clinical Course    Patricia Wilcox is a 35 y.o. female with past medical history of anxiety, depression, PTSD, insomnia who presented with a headache, mostly behind left eye. Vital signs normal and patient afebrile. Neurological exam within normal limits. Eye exam normal. No signs of SAH, meningitis, or eye pathology. Patient was given 1 L NS, Benadryl, and Tylenol which improved headache. She was sent home with prescription for Naproxen and instructed to establish care with neurology due to frequent self reported migraines.   Final Clinical Impressions(s) / ED Diagnoses   Final diagnoses:  Intractable headache, unspecified chronicity pattern, unspecified headache type    New Prescriptions Discharge Medication List as of 05/02/2016  5:27 PM    START taking these medications   Details  naproxen (NAPROSYN) 375 MG tablet Take 1 tablet (375 mg total) by mouth 2 (two) times daily with  a meal., Starting Thu 05/02/2016, Print         Beaulah Dinninghristina M Nicholai Willette, MD 05/02/16 2332    Geoffery Lyonsouglas Delo, MD 05/02/16 2334

## 2016-05-02 NOTE — ED Notes (Signed)
Pt sts she would like IVF dc'd because she is getting "scared of the bubbles" in the IV tubing. No apparent air in line noted at this time. IVF dc'd- received NS. RN noticed round white tablet on chair in room- pt confirms this is the Tylenol she was given earlier. Sts she "threw it up"- no other evidence of emesis noted.

## 2016-05-02 NOTE — ED Triage Notes (Signed)
C/o HA x 2 weeks-NAD-steady gait

## 2016-05-02 NOTE — ED Triage Notes (Addendum)
When reviewed past medical hx pt states she has had a new dx from her GYN since last visit here however she will not discuss with me only the MD

## 2016-05-02 NOTE — Discharge Instructions (Signed)
You were seen today for eye pain and headache. Your symptoms are from a migraine. Your eyes look normal. Your urine also is normal and shows no signs of infection. You can try Naprosyn twice daily as needed if you have a headache at home. Take this medication with a full glass of water and food.  Ultimately you will need to see a neurologist to address these headaches. Take care!

## 2016-06-10 ENCOUNTER — Encounter (HOSPITAL_BASED_OUTPATIENT_CLINIC_OR_DEPARTMENT_OTHER): Payer: Self-pay | Admitting: *Deleted

## 2016-06-10 ENCOUNTER — Emergency Department (HOSPITAL_BASED_OUTPATIENT_CLINIC_OR_DEPARTMENT_OTHER)
Admission: EM | Admit: 2016-06-10 | Discharge: 2016-06-11 | Disposition: A | Discharge status: Home / Self Care | Payer: Self-pay | Attending: Emergency Medicine | Admitting: Emergency Medicine

## 2016-06-10 DIAGNOSIS — F17210 Nicotine dependence, cigarettes, uncomplicated: Secondary | ICD-10-CM | POA: Insufficient documentation

## 2016-06-10 DIAGNOSIS — R740 Nonspecific elevation of levels of transaminase and lactic acid dehydrogenase [LDH]: Secondary | ICD-10-CM | POA: Insufficient documentation

## 2016-06-10 DIAGNOSIS — R7401 Elevation of levels of liver transaminase levels: Secondary | ICD-10-CM

## 2016-06-10 DIAGNOSIS — Z791 Long term (current) use of non-steroidal anti-inflammatories (NSAID): Secondary | ICD-10-CM | POA: Insufficient documentation

## 2016-06-10 DIAGNOSIS — K8050 Calculus of bile duct without cholangitis or cholecystitis without obstruction: Secondary | ICD-10-CM | POA: Insufficient documentation

## 2016-06-10 DIAGNOSIS — R1011 Right upper quadrant pain: Secondary | ICD-10-CM

## 2016-06-10 LAB — URINALYSIS, ROUTINE W REFLEX MICROSCOPIC
Glucose, UA: NEGATIVE mg/dL
HGB URINE DIPSTICK: NEGATIVE
KETONES UR: 15 mg/dL — AB
Leukocytes, UA: NEGATIVE
NITRITE: NEGATIVE
PH: 6 (ref 5.0–8.0)
Protein, ur: 30 mg/dL — AB
Specific Gravity, Urine: 1.036 — ABNORMAL HIGH (ref 1.005–1.030)

## 2016-06-10 LAB — COMPREHENSIVE METABOLIC PANEL
ALK PHOS: 57 U/L (ref 38–126)
ALT: 67 U/L — ABNORMAL HIGH (ref 14–54)
ANION GAP: 7 (ref 5–15)
AST: 155 U/L — ABNORMAL HIGH (ref 15–41)
Albumin: 4.3 g/dL (ref 3.5–5.0)
BILIRUBIN TOTAL: 1.4 mg/dL — AB (ref 0.3–1.2)
BUN: 14 mg/dL (ref 6–20)
CALCIUM: 9.2 mg/dL (ref 8.9–10.3)
CO2: 26 mmol/L (ref 22–32)
Chloride: 105 mmol/L (ref 101–111)
Creatinine, Ser: 0.81 mg/dL (ref 0.44–1.00)
GFR calc non Af Amer: >60 mL/min (ref >60)
Glucose, Bld: 91 mg/dL (ref 65–99)
POTASSIUM: 3.1 mmol/L — AB (ref 3.5–5.1)
Sodium: 138 mmol/L (ref 135–145)
TOTAL PROTEIN: 7.8 g/dL (ref 6.5–8.1)

## 2016-06-10 LAB — URINALYSIS, MICROSCOPIC (REFLEX)

## 2016-06-10 LAB — PREGNANCY, URINE: Preg Test, Ur: NEGATIVE

## 2016-06-10 MED ORDER — ONDANSETRON HCL 4 MG/2ML IJ SOLN: 4.0000 mg | Freq: Once | INTRAMUSCULAR | Status: DC

## 2016-06-10 MED ORDER — DICYCLOMINE HCL 10 MG PO CAPS
20.0000 mg | ORAL_CAPSULE | Freq: Once | ORAL | Status: DC
  Filled 2016-06-10: qty 2

## 2016-06-10 MED ORDER — ONDANSETRON 4 MG PO TBDP
4.0000 mg | ORAL_TABLET | Freq: Once | ORAL | Status: DC
  Filled 2016-06-10: qty 1

## 2016-06-10 MED ORDER — SODIUM CHLORIDE 0.9 % IV BOLUS (SEPSIS): 1000.0000 mL | Freq: Once | INTRAVENOUS | Status: DC

## 2016-06-10 NOTE — ED Triage Notes (Signed)
Abdominal pain into her back x 45 minutes.

## 2016-06-10 NOTE — ED Provider Notes (Signed)
[facility]   CSN: [id number] Arrival date & time: 06/10/16  2108  By signing my name below, I, Patricia Wilcox, attest that this documentation has been prepared under the direction and in the presence of Patricia CrumbleAdeleke Ellen Mayol, MD. Electronically signed, Patricia Wilcox, ED Scribe. 06/10/16. 11:48 PM.  History   Chief Complaint Chief Complaint  Patient presents with  . Abdominal Pain    HPI HPI Comments: Patricia ParsleyJamie D Mcenery is a 35 y.o. female, with Hx of Cholecystectomy, GERD, Gastritis, who presents to the Emergency Department complaining of moderate, constant, right sided abdominal pain with associated nausea that started today. Pt states that she started to have a dull pain in the right side of her abdomen. She reports that the pain radiates to the center of her chest and into her back. She reports that the pain gradually worsened throughout the day but has improved since arriving in the ED. No meds taken PTA. Pt does not take any medications for her gastritis. No diarrhea.  The history is provided by the patient. No language interpreter was used.    Past Medical History:  Diagnosis Date  . Anemia   . Anxiety   . Anxiety   . Depression   . GERD (gastroesophageal reflux disease)   . High cholesterol   . Insomnia   . Palpitation   . PTSD (post-traumatic stress disorder)     Patient Active Problem List   Diagnosis Date Noted  . Chest pain 04/23/2013  . Anemia   . High cholesterol   . Anxiety   . Depression   . Insomnia   . Palpitation   . GERD (gastroesophageal reflux disease)     Past Surgical History:  Procedure Laterality Date  . BREAST REDUCTION SURGERY    . CHOLECYSTECTOMY    . gallstone removal    . WISDOM TOOTH EXTRACTION      OB History    No data available       Home Medications    Prior to Admission medications   Medication Sig Start Date End Date Taking? Authorizing Provider  naproxen (NAPROSYN) 375 MG tablet Take 1 tablet (375 mg  total) by mouth 2 (two) times daily with a meal. 05/02/16   Beaulah Dinninghristina M Gambino, MD    Family History No family history on file.  Social History Social History  Substance Use Topics  . Smoking status: Current Every Day Smoker    Packs/day: 0.25    Types: Cigarettes  . Smokeless tobacco: Never Used  . Alcohol use No    Allergies   Aspirin and Contrast media [iodinated diagnostic agents]   Review of Systems Review of Systems A complete 10 system review of systems was obtained and all systems are negative except as noted in the HPI and PMH.    Physical Exam Updated Vital Signs BP 136/93   Pulse 65   Temp 97.6 F (36.4 C) (Oral)   Resp 20   Ht 5\' 5"  (1.651 m)   Wt 161 lb (73 kg)   LMP 06/03/2016   SpO2 100%   BMI 26.79 kg/m   Physical Exam  Constitutional: She is oriented to person, place, and time. She appears well-developed and well-nourished. No distress.  HENT:  Head: Normocephalic and atraumatic.  Nose: Nose normal.  Mouth/Throat: Oropharynx is clear and moist. No oropharyngeal exudate.  Eyes: Conjunctivae and EOM are normal. Pupils are equal, round, and reactive to light. No scleral icterus.  Neck: Normal range of motion. Neck  supple. No JVD present. No tracheal deviation present. No thyromegaly present.  Cardiovascular: Normal rate, regular rhythm and normal heart sounds.  Exam reveals no gallop and no friction rub.   No murmur heard. Pulmonary/Chest: Effort normal and breath sounds normal. No respiratory distress. She has no wheezes. She exhibits no tenderness.  Abdominal: Soft. Bowel sounds are normal. She exhibits no distension and no mass. There is tenderness. There is no rebound and no guarding.  RUQ tenderness to palpation  Musculoskeletal: Normal range of motion. She exhibits no edema or tenderness.  Lymphadenopathy:    She has no cervical adenopathy.  Neurological: She is alert and oriented to person, place, and time. No cranial nerve deficit. She  exhibits normal muscle tone.  Skin: Skin is warm and dry. No rash noted. No erythema. No pallor.  Nursing note and vitals reviewed.    ED Treatments / Results  DIAGNOSTIC STUDIES: Oxygen Saturation is 100% on RA, normal by my interpretation.  COORDINATION OF CARE: 11:46 PM-Discussed treatment plan with pt at bedside and pt agreed to plan.   Labs (all labs ordered are listed, but only abnormal results are displayed) Labs Reviewed  URINALYSIS, ROUTINE W REFLEX MICROSCOPIC - Abnormal; Notable for the following:       Result Value   Color, Urine AMBER (*)    APPearance CLOUDY (*)    Specific Gravity, Urine 1.036 (*)    Bilirubin Urine SMALL (*)    Ketones, ur 15 (*)    Protein, ur 30 (*)    All other components within normal limits  URINALYSIS, MICROSCOPIC (REFLEX) - Abnormal; Notable for the following:    Bacteria, UA FEW (*)    Squamous Epithelial / LPF 0-5 (*)    All other components within normal limits  PREGNANCY, URINE  CBC WITH DIFFERENTIAL/PLATELET  COMPREHENSIVE METABOLIC PANEL    EKG  EKG Interpretation None       Radiology No results found.  Procedures Procedures (including critical care time)  Medications Ordered in ED Medications  sodium chloride 0.9 % bolus 1,000 mL (1,000 mLs Intravenous Refused 06/10/16 2314)     Initial Impression / Assessment and Plan / ED Course  I have reviewed the triage vital signs and the nursing notes.  Pertinent labs & imaging results that were available during my care of the patient were reviewed by me and considered in my medical decision making (see chart for details).  Clinical Course    Patient presents to the ED for severe RUQ abd pain with N/V.  History concerning for stone or gb pathology, however patient has h/o cholecystectomy.  Labs also reveal elevated transaminitis, will obtain RUQ [place] for eval for duct dilation.  She was given zofran and bentyl for pain control.  Will continue to reassess.  12:59 AM  Patient has refused all pain and nausea medications. Also refused oral contrast and the CT scan in general.  She is requesting to come back tomorrow for [place] to evaluate elevated liver enzymes.  She was advised to go somewhere with an inpatient unit in case she needs to be admitted for an obstruction in her ducts.  She demonstrates good understanding. She appears well and in NAD.  Vs remain within her normal limits and she is safe for DC.    Final Clinical Impressions(s) / ED Diagnoses   Final diagnoses:  None    New Prescriptions New Prescriptions   No medications on file    I personally performed the services described in this  documentation, which was scribed in my presence. The recorded information has been reviewed and is accurate.       Patricia CrumbleAdeleke Lucrezia Dehne, MD 06/11/16 0100

## 2016-06-10 NOTE — ED Triage Notes (Signed)
States she passed gas after I left the room and the pain got better. She still feels like she needs to pass more gas.

## 2016-06-10 NOTE — ED Notes (Signed)
Pt laying on the floor at triage. Refused to sit in the w/c. Will give pt time to compose herself. She is aware VS cannot be obtained with the present behavior.

## 2016-06-11 ENCOUNTER — Other Ambulatory Visit (HOSPITAL_BASED_OUTPATIENT_CLINIC_OR_DEPARTMENT_OTHER): Payer: Self-pay | Admitting: Emergency Medicine

## 2016-06-11 ENCOUNTER — Emergency Department (HOSPITAL_BASED_OUTPATIENT_CLINIC_OR_DEPARTMENT_OTHER): Payer: Self-pay

## 2016-06-11 ENCOUNTER — Ambulatory Visit (HOSPITAL_BASED_OUTPATIENT_CLINIC_OR_DEPARTMENT_OTHER)
Admission: RE | Admit: 2016-06-11 | Discharge: 2016-06-11 | Disposition: A | Discharge status: Home / Self Care | Payer: Self-pay | Source: Ambulatory Visit | Attending: Emergency Medicine | Admitting: Emergency Medicine

## 2016-06-11 DIAGNOSIS — R748 Abnormal levels of other serum enzymes: Secondary | ICD-10-CM

## 2016-06-11 DIAGNOSIS — R1011 Right upper quadrant pain: Secondary | ICD-10-CM

## 2016-06-11 LAB — CBC WITH DIFFERENTIAL/PLATELET
BASOS ABS: 0 10*3/uL (ref 0.0–0.1)
BASOS PCT: 0 %
EOS ABS: 0.2 10*3/uL (ref 0.0–0.7)
Eosinophils Relative: 2 %
HEMATOCRIT: 37.1 % (ref 36.0–46.0)
Hemoglobin: 12.1 g/dL (ref 12.0–15.0)
Lymphocytes Relative: 14 %
Lymphs Abs: 1.4 10*3/uL (ref 0.7–4.0)
MCH: 24.4 pg — AB (ref 26.0–34.0)
MCHC: 32.6 g/dL (ref 30.0–36.0)
MCV: 74.8 fL — AB (ref 78.0–100.0)
MONO ABS: 0.6 10*3/uL (ref 0.1–1.0)
MONOS PCT: 6 %
Neutro Abs: 7.7 10*3/uL (ref 1.7–7.7)
Neutrophils Relative %: 77 %
PLATELETS: 267 10*3/uL (ref 150–400)
RBC: 4.96 MIL/uL (ref 3.87–5.11)
RDW: 14.7 % (ref 11.5–15.5)
WBC: 9.9 10*3/uL (ref 4.0–10.5)

## 2016-06-11 LAB — LIPASE, BLOOD: LIPASE: 20 U/L (ref 11–51)

## 2016-06-11 NOTE — ED Provider Notes (Signed)
35 yo F here left side for right upper quadrant abdominal pain. There is some concern for possible common bile duct dilation and so an outpatient ultrasound was ordered for today. Patient returned within unremarkable ultrasound. I gave the patient results and answered questions.   Melene Planan Cevin Rubinstein, DO 06/11/16 2030

## 2016-06-11 NOTE — ED Notes (Signed)
Pt refused PO medications

## 2016-06-11 NOTE — ED Notes (Signed)
Per CT tech pt is refusing oral contrast to prep for CT

## 2016-07-16 ENCOUNTER — Emergency Department (HOSPITAL_BASED_OUTPATIENT_CLINIC_OR_DEPARTMENT_OTHER)
Admission: EM | Admit: 2016-07-16 | Discharge: 2016-07-16 | Disposition: A | Discharge status: Home / Self Care | Payer: Self-pay | Attending: Emergency Medicine | Admitting: Emergency Medicine

## 2016-07-16 ENCOUNTER — Encounter (HOSPITAL_BASED_OUTPATIENT_CLINIC_OR_DEPARTMENT_OTHER): Payer: Self-pay | Admitting: Emergency Medicine

## 2016-07-16 DIAGNOSIS — F17210 Nicotine dependence, cigarettes, uncomplicated: Secondary | ICD-10-CM | POA: Insufficient documentation

## 2016-07-16 DIAGNOSIS — J3089 Other allergic rhinitis: Secondary | ICD-10-CM | POA: Insufficient documentation

## 2016-07-16 NOTE — ED Notes (Signed)
ED Provider at bedside. 

## 2016-07-16 NOTE — ED Provider Notes (Signed)
[facility] Provider Note   CSN: [id number] Arrival date & time: 07/16/16  1404   By signing my name below, I, Soijett Blue, attest that this documentation has been prepared under the direction and in the presence of Audry Piliyler Saran Laviolette, PA-C Electronically Signed: Soijett Blue, ED Scribe. 07/16/16. 5:03 PM.  History   Chief Complaint Chief Complaint  Patient presents with  . Otalgia    HPI Patricia Wilcox is a 36 y.o. female who presents to the Emergency Department complaining of right ear pain onset 4 days ago. Pt notes that she cleans her ears with Q-tips twice daily. Pt is having associated symptoms of right jaw pain, right lower dental pain, rhinorrhea, sore throat, and nasal congestion. She notes that she has not tried any medications for the relief of her symptoms. She denies ear discharge, fever, chills, and any other symptoms.   The history is provided by the patient. No language interpreter was used.    Past Medical History:  Diagnosis Date  . Anemia   . Anxiety   . Anxiety   . Depression   . GERD (gastroesophageal reflux disease)   . High cholesterol   . Insomnia   . Palpitation   . PTSD (post-traumatic stress disorder)     Patient Active Problem List   Diagnosis Date Noted  . Chest pain 04/23/2013  . Anemia   . High cholesterol   . Anxiety   . Depression   . Insomnia   . Palpitation   . GERD (gastroesophageal reflux disease)     Past Surgical History:  Procedure Laterality Date  . BREAST REDUCTION SURGERY    . CHOLECYSTECTOMY    . gallstone removal    . WISDOM TOOTH EXTRACTION      OB History    No data available       Home Medications    Prior to Admission medications   Medication Sig Start Date End Date Taking? Authorizing Provider  naproxen (NAPROSYN) 375 MG tablet Take 1 tablet (375 mg total) by mouth 2 (two) times daily with a meal. 05/02/16   Beaulah Dinninghristina M Gambino, MD    Family History History reviewed. No pertinent family  history.  Social History Social History  Substance Use Topics  . Smoking status: Current Every Day Smoker    Packs/day: 0.25    Types: Cigarettes  . Smokeless tobacco: Never Used  . Alcohol use No     Allergies   Aspirin and Contrast media [iodinated diagnostic agents]   Review of Systems Review of Systems  Constitutional: Negative for chills and fever.  HENT: Positive for congestion, dental problem (right lower), ear pain (right), rhinorrhea and sore throat. Negative for ear discharge.        +right sided jaw pain   A complete 10 system review of systems was obtained and all systems are negative except as noted in the HPI and PMH.   Physical Exam Updated Vital Signs BP 112/73 (BP Location: Left Arm)   Pulse 72   Temp 98.4 F (36.9 C) (Oral)   Resp 16   Ht 5\' 5"  (1.651 m)   Wt 150 lb (68 kg)   SpO2 100%   BMI 24.96 kg/m   Physical Exam  Constitutional: She is oriented to person, place, and time. She appears well-developed and well-nourished. No distress.  HENT:  Head: Normocephalic and atraumatic.  Right Ear: Tympanic membrane, external ear and ear canal normal. No tenderness.  Left Ear: Tympanic membrane, external ear and  ear canal normal. No tenderness.  Nose: Right sinus exhibits no maxillary sinus tenderness and no frontal sinus tenderness. Left sinus exhibits no maxillary sinus tenderness and no frontal sinus tenderness.  Mouth/Throat: Uvula is midline, oropharynx is clear and moist and mucous membranes are normal. Abnormal dentition. No oropharyngeal exudate or posterior oropharyngeal erythema.  Tragus non-TTP. No erythema to auditory canal. Dental abnormality noted to right lower mandible.   Eyes: EOM are normal.  Neck: Neck supple.  Cardiovascular: Normal rate.   Pulmonary/Chest: Effort normal. No respiratory distress.  Abdominal: She exhibits no distension.  Musculoskeletal: Normal range of motion.  Neurological: She is alert and oriented to person,  place, and time.  Skin: Skin is warm and dry.  Psychiatric: She has a normal mood and affect. Her behavior is normal.  Nursing note and vitals reviewed.  ED Treatments / Results  DIAGNOSTIC STUDIES: Oxygen Saturation is 100% on RA, nl by my interpretation.    COORDINATION OF CARE: 5:02 PM Discussed treatment plan with pt at bedside which includes claritin-D, flonase, follow up with dentist, and pt agreed to plan.   Procedures Procedures (including critical care time)  Medications Ordered in ED Medications - No data to display   Initial Impression / Assessment and Plan / ED Course  I have reviewed the triage vital signs and the nursing notes.  I have reviewed the relevant previous healthcare records. I obtained HPI from historian.  ED Course:  Assessment: Pt is a 35yF presents with right ear pain x 4 days. No fevers. On exam, pt in NAD. VSS. Afebrile. Lungs CTA. Right ear unremarkable. TMs unremarkable. No erythema. Noted dental abnormalities on right lower mandible with chipped tooth. No abscess. No trismus.Symptoms could be related to allergic rhinitis vs dental cary with exposed nerve root. Patients symptoms are consistent with URI, likely viral etiology. Discussed that antibiotics are not indicated for viral infections. Counseled on Claritin and follow up to Dentist as well. Pt will be discharged with symptomatic treatment.  Verbalizes understanding and is agreeable with plan. Pt is hemodynamically stable & in NAD prior to dc.  Disposition/Plan:  DC home Additional Verbal discharge instructions given and discussed with patient.  Pt Instructed to f/u with dentist and PCP in the next week for evaluation and treatment of symptoms. Return precautions given Pt acknowledges and agrees with plan  Supervising Physician Canary Brim Tegeler, MD  Final Clinical Impressions(s) / ED Diagnoses   Final diagnoses:  Acute allergic rhinitis due to other allergen, unspecified seasonality      New Prescriptions New Prescriptions   No medications on file  I personally performed the services described in this documentation, which was scribed in my presence. The recorded information has been reviewed and is accurate.     Audry Pili, PA-C 07/16/16 1710    Canary Brim Tegeler, MD 07/17/16 1058

## 2016-07-16 NOTE — ED Triage Notes (Signed)
Right sided ear pain and jaw pain. The patient reports that she has had this for over a week
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
## 2016-07-29 ENCOUNTER — Emergency Department (HOSPITAL_BASED_OUTPATIENT_CLINIC_OR_DEPARTMENT_OTHER)
Admission: EM | Admit: 2016-07-29 | Discharge: 2016-07-30 | Disposition: A | Discharge status: Home / Self Care | Payer: Self-pay | Attending: Emergency Medicine | Admitting: Emergency Medicine

## 2016-07-29 ENCOUNTER — Encounter (HOSPITAL_BASED_OUTPATIENT_CLINIC_OR_DEPARTMENT_OTHER): Payer: Self-pay

## 2016-07-29 DIAGNOSIS — K029 Dental caries, unspecified: Secondary | ICD-10-CM | POA: Insufficient documentation

## 2016-07-29 DIAGNOSIS — Z791 Long term (current) use of non-steroidal anti-inflammatories (NSAID): Secondary | ICD-10-CM | POA: Insufficient documentation

## 2016-07-29 DIAGNOSIS — F17210 Nicotine dependence, cigarettes, uncomplicated: Secondary | ICD-10-CM | POA: Insufficient documentation

## 2016-07-29 NOTE — ED Triage Notes (Signed)
Pt c/o PC-EKG done in traige

## 2016-07-29 NOTE — ED Triage Notes (Signed)
Being treated for tooth infection with rx abx and ibuprofen x 2 days-(right lower) by PCP-NAD-steady gait

## 2016-07-29 NOTE — ED Notes (Signed)
C/o rt lower tooth pain x 3 days  Has been on antibiotics x 2 day

## 2016-07-30 MED ORDER — HYDROCODONE-ACETAMINOPHEN 5-325 MG PO TABS: 1.0000 | ORAL_TABLET | ORAL | 0 refills | Status: DC | PRN

## 2016-07-30 MED ORDER — BUPIVACAINE-EPINEPHRINE (PF) 0.5% -1:200000 IJ SOLN
1.8000 mL | Freq: Once | INTRAMUSCULAR | Status: AC
  Administered 2016-07-30: 1.8 mL

## 2016-07-30 MED ORDER — BUPIVACAINE-EPINEPHRINE (PF) 0.5% -1:200000 IJ SOLN
INTRAMUSCULAR | Status: AC
  Administered 2016-07-30: 1.8 mL
  Filled 2016-07-30: qty 1.8

## 2016-07-30 NOTE — ED Notes (Signed)
MD at bedside for evaluation at this time.

## 2016-07-30 NOTE — ED Notes (Signed)
[facility] for [facility] and rx for amoxicillin and ibuprofen given at that time.

## 2016-07-30 NOTE — ED Provider Notes (Signed)
[facility] Provider Note: Patricia Wilcox Patricia Lannom, MD, FACEP  CSN: [id number] MRN: [id number] ARRIVAL: 07/29/16 at 1952 ROOM: MH06/[facility]06  By signing my name below, I, Patricia Wilcox, attest that this documentation has been prepared under the direction and in the presence of Patricia LibraJohn Kasheena Sambrano, MD  Electronically Signed: Clovis PuAvnee Wilcox, ED Scribe. 07/30/16. 12:48 AM.  CHIEF COMPLAINT  Dental Pain   HISTORY OF PRESENT ILLNESS   Patricia ParsleyJamie D Wilcox is a 36 y.o. female who presents to the Emergency Department with a long-standing history of a carious right lower second premolar. She has developed pain in that tooth over the past week which is been severe for the past 3 days. Pain is worse with percussion. She reports her pain radiates to her right ear. Pt was [facility] for her symptoms 2 days ago and was prescribed amoxicillin and ibuprofen which she has taken with no relief. Pt is not followed by a dentist.   [facility] [place] substances [place] reveals the patient has received no narcotic prescriptions in the past year.    Past Medical History:  Diagnosis Date  . Anemia   . Anxiety   . Anxiety   . Depression   . GERD (gastroesophageal reflux disease)   . High cholesterol   . Insomnia   . Palpitation   . PTSD (post-traumatic stress disorder)     Past Surgical History:  Procedure Laterality Date  . BREAST REDUCTION SURGERY    . CHOLECYSTECTOMY    . gallstone removal    . WISDOM TOOTH EXTRACTION      No family history on file.  Social History  Substance Use Topics  . Smoking status: Current Every Day Smoker    Packs/day: 0.25    Types: Cigarettes  . Smokeless tobacco: Never Used  . Alcohol use No    Prior to Admission medications   Not on File    Allergies Aspirin and Contrast media [iodinated diagnostic agents]   REVIEW OF SYSTEMS  Negative except as noted here or in the History of Present Illness.   PHYSICAL  EXAMINATION  Initial Vital Signs Blood pressure (!) 139/101, pulse 64, temperature 98.2 F (36.8 C), temperature source Oral, resp. rate 18, height 5\' 5"  (1.651 m), weight 150 lb (68 kg), last menstrual period 07/25/2016, SpO2 100 %.  Examination General: Well-developed, well-nourished female in no acute distress; appearance consistent with age of record HENT: normocephalic; atraumatic; carious right lower second premolar, tender to percussion. Eyes: pupils equal, round and reactive to light; extraocular muscles intact Neck: supple; no lymphadenopathy Heart: regular rate and rhythm Lungs: clear to auscultation bilaterally Abdomen: soft; nondistended Extremities: No deformity; full range of motion Neurologic: Awake, alert and oriented; motor function intact in all extremities and symmetric; no facial droop Skin: Warm and dry Psychiatric: Normal mood and affect   RESULTS  Summary of this visit's results, reviewed by myself:   EKG Interpretation  Date/Time:  Monday July 29 2016 20:10:47 EST Ventricular Rate:  69 PR Interval:  166 QRS Duration: 90 QT Interval:  404 QTC Calculation: 432 R Axis:   67 Text Interpretation:  Normal sinus rhythm with sinus arrhythmia Nonspecific T wave abnormality Abnormal ECG No significant change was found Confirmed by Harmonie Verrastro  MD, Jonny RuizJOHN ([id number]) on 07/30/2016 12:40:18 AM      Laboratory Studies: No results found for this or any previous visit (from the past 24 hour(s)). Imaging Studies: No results found.  ED COURSE  Nursing  notes and initial vitals signs, including pulse oximetry, reviewed.  Vitals:   07/29/16 2009 07/29/16 2010 07/29/16 2352  BP: 159/97  (!) 139/101  Pulse: 68  64  Resp: 18  18  Temp: 98.2 F (36.8 C)    TempSrc: Oral    SpO2: 100%  100%  Weight:  150 lb (68 kg)   Height:  5\' 5"  (1.651 m)     PROCEDURES   DENTAL BLOCK 1.8 milliliters of 0.5% bupivacaine with epinephrine were injected into the buccal fold adjacent  to the right lower second premolar. The patient tolerated this well and there were no immediate complications. Adequate analgesia was obtained.   ED DIAGNOSES     ICD-9-CM ICD-10-CM   1. Pain due to dental caries 521.00 K02.9     I personally performed the services described in this documentation, which was scribed in my presence. The recorded information has been reviewed and is accurate.     Patricia Libra, MD 07/30/16 [phone]

## 2016-07-30 NOTE — ED Notes (Signed)
Pt requesting to see charge nurse. Pt wanting pain medication. Explained to pt that I could not give her any pain medication until the doctor saw and her and gave me an order. I offered pt ibuprofen and she states ibuprofen isn't helping her pain and declined. Apologized for wait.

## 2016-07-30 NOTE — ED Notes (Signed)
ED Provider at bedside. 

## 2016-08-08 ENCOUNTER — Encounter (HOSPITAL_BASED_OUTPATIENT_CLINIC_OR_DEPARTMENT_OTHER): Payer: Self-pay

## 2016-08-08 ENCOUNTER — Emergency Department (HOSPITAL_BASED_OUTPATIENT_CLINIC_OR_DEPARTMENT_OTHER)
Admission: EM | Admit: 2016-08-08 | Discharge: 2016-08-09 | Disposition: A | Discharge status: Home / Self Care | Payer: Self-pay | Attending: Emergency Medicine | Admitting: Emergency Medicine

## 2016-08-08 ENCOUNTER — Emergency Department (HOSPITAL_BASED_OUTPATIENT_CLINIC_OR_DEPARTMENT_OTHER): Payer: Self-pay

## 2016-08-08 DIAGNOSIS — R079 Chest pain, unspecified: Secondary | ICD-10-CM

## 2016-08-08 DIAGNOSIS — J309 Allergic rhinitis, unspecified: Secondary | ICD-10-CM | POA: Insufficient documentation

## 2016-08-08 DIAGNOSIS — Z87891 Personal history of nicotine dependence: Secondary | ICD-10-CM | POA: Insufficient documentation

## 2016-08-08 DIAGNOSIS — J069 Acute upper respiratory infection, unspecified: Secondary | ICD-10-CM | POA: Insufficient documentation

## 2016-08-08 NOTE — ED Triage Notes (Signed)
C/o chills, fatigue, CP with deep breath-started last night-NAD-steady gait

## 2016-08-08 NOTE — ED Notes (Signed)
Pt reports chills x 24 hours and chest discomfort x 9 hours also with runny nose
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
## 2016-08-09 LAB — CBC WITH DIFFERENTIAL/PLATELET
BASOS PCT: 0 %
Basophils Absolute: 0 10*3/uL (ref 0.0–0.1)
Eosinophils Absolute: 0.1 10*3/uL (ref 0.0–0.7)
Eosinophils Relative: 3 %
HEMATOCRIT: 33.9 % — AB (ref 36.0–46.0)
Hemoglobin: 11 g/dL — ABNORMAL LOW (ref 12.0–15.0)
LYMPHS ABS: 2.4 10*3/uL (ref 0.7–4.0)
LYMPHS PCT: 51 %
MCH: 24 pg — AB (ref 26.0–34.0)
MCHC: 32.4 g/dL (ref 30.0–36.0)
MCV: 74 fL — AB (ref 78.0–100.0)
MONOS PCT: 13 %
Monocytes Absolute: 0.6 10*3/uL (ref 0.1–1.0)
Neutro Abs: 1.5 10*3/uL — ABNORMAL LOW (ref 1.7–7.7)
Neutrophils Relative %: 33 %
Platelets: 254 10*3/uL (ref 150–400)
RBC: 4.58 MIL/uL (ref 3.87–5.11)
RDW: 14.4 % (ref 11.5–15.5)
WBC: 4.6 10*3/uL (ref 4.0–10.5)

## 2016-08-09 LAB — BASIC METABOLIC PANEL
Anion gap: 5 (ref 5–15)
BUN: 15 mg/dL (ref 6–20)
CHLORIDE: 108 mmol/L (ref 101–111)
CO2: 26 mmol/L (ref 22–32)
CREATININE: 0.89 mg/dL (ref 0.44–1.00)
Calcium: 9.1 mg/dL (ref 8.9–10.3)
GFR calc Af Amer: >60 mL/min (ref >60)
GFR calc non Af Amer: >60 mL/min (ref >60)
GLUCOSE: 105 mg/dL — AB (ref 65–99)
POTASSIUM: 3.3 mmol/L — AB (ref 3.5–5.1)
Sodium: 139 mmol/L (ref 135–145)

## 2016-08-09 LAB — I-STAT TROPONIN, ED: TROPONIN I, POC: 0 ng/mL (ref 0.00–0.08)

## 2016-08-09 MED ORDER — FLUTICASONE PROPIONATE 50 MCG/ACT NA SUSP: 2.0000 | Freq: Every day | NASAL | 0 refills | Status: DC

## 2016-08-09 MED ORDER — ACETAMINOPHEN 500 MG PO TABS: 500.0000 mg | ORAL_TABLET | Freq: Four times a day (QID) | ORAL | 0 refills | Status: DC | PRN

## 2016-08-09 MED ORDER — CETIRIZINE HCL 10 MG PO TABS: 10.0000 mg | ORAL_TABLET | Freq: Every day | ORAL | 1 refills | Status: DC

## 2016-08-09 MED ORDER — OMEPRAZOLE 20 MG PO CPDR: 20.0000 mg | DELAYED_RELEASE_CAPSULE | Freq: Every day | ORAL | 0 refills | Status: DC

## 2016-08-09 NOTE — ED Notes (Signed)
Pt refused IV  

## 2016-08-09 NOTE — Progress Notes (Signed)
I-stat troponin result given to PA Dansie.

## 2016-08-13 ENCOUNTER — Encounter (HOSPITAL_BASED_OUTPATIENT_CLINIC_OR_DEPARTMENT_OTHER): Payer: Self-pay | Admitting: Emergency Medicine

## 2016-08-13 ENCOUNTER — Emergency Department (HOSPITAL_BASED_OUTPATIENT_CLINIC_OR_DEPARTMENT_OTHER)
Admission: EM | Admit: 2016-08-13 | Discharge: 2016-08-13 | Disposition: A | Discharge status: Home / Self Care | Payer: Self-pay | Attending: Emergency Medicine | Admitting: Emergency Medicine

## 2016-08-13 DIAGNOSIS — Z79899 Other long term (current) drug therapy: Secondary | ICD-10-CM | POA: Insufficient documentation

## 2016-08-13 DIAGNOSIS — K0889 Other specified disorders of teeth and supporting structures: Secondary | ICD-10-CM

## 2016-08-13 DIAGNOSIS — R112 Nausea with vomiting, unspecified: Secondary | ICD-10-CM | POA: Insufficient documentation

## 2016-08-13 MED ORDER — AMOXICILLIN 500 MG PO CAPS: 500.0000 mg | ORAL_CAPSULE | Freq: Three times a day (TID) | ORAL | 0 refills | Status: DC

## 2016-08-13 MED ORDER — MELOXICAM 15 MG PO TABS: 15.0000 mg | ORAL_TABLET | Freq: Every day | ORAL | 0 refills | Status: DC

## 2016-08-13 NOTE — ED Triage Notes (Signed)
Patient c/o dental pain x2 days. Patient reports she had an extraction done @ 2 weeks ago. Patient reports emesis x2, 2 days ago that caused her pain to return. Patient reports she took Aleve yesterday, with some relief, no treatment today.

## 2016-08-13 NOTE — ED Provider Notes (Signed)
[facility]   CSN: [id number] Arrival date & time: 08/13/16  1947   By signing my name below, I, Freida Busmaniana Omoyeni, attest that this documentation has been prepared under the direction and in the presence of Arthor CaptainAbigail Damiano Stamper, PA-C. Electronically Signed: Freida Busmaniana Omoyeni, Scribe. 08/13/2016. 10:38 PM.    History   Chief Complaint Chief Complaint  Patient presents with  . Dental Pain    The history is provided by the patient. No language interpreter was used.     HPI Comments:  Patricia Wilcox is a 36 y.o. female who presents to the Emergency Department complaining of 6/10, right sided dental pain x a few days. Pt notes she recently had a dental extraction to the site and was healing fine until she became sick. She reports nausea and vomiting and states after vomiting she began to experience pain to the site. She has taken aleve with little relief. No fever.   Past Medical History:  Diagnosis Date  . Anemia   . Anxiety   . Anxiety   . Depression   . GERD (gastroesophageal reflux disease)   . High cholesterol   . Insomnia   . Palpitation   . PTSD (post-traumatic stress disorder)     Patient Active Problem List   Diagnosis Date Noted  . Chest pain 04/23/2013  . Anemia   . High cholesterol   . Anxiety   . Depression   . Insomnia   . Palpitation   . GERD (gastroesophageal reflux disease)     Past Surgical History:  Procedure Laterality Date  . BREAST REDUCTION SURGERY    . CHOLECYSTECTOMY    . gallstone removal    . WISDOM TOOTH EXTRACTION      OB History    No data available       Home Medications    Prior to Admission medications   Medication Sig Start Date End Date Taking? Authorizing Provider  cetirizine (ZYRTEC ALLERGY) 10 MG tablet Take 1 tablet (10 mg total) by mouth daily. 08/09/16  Yes Everlene FarrierWilliam Dansie, PA-C  fluticasone (FLONASE) 50 MCG/ACT nasal spray Place 2 sprays into both nostrils daily. 08/09/16  Yes Everlene FarrierWilliam Dansie, PA-C    omeprazole (PRILOSEC) 20 MG capsule Take 1 capsule (20 mg total) by mouth daily. 08/09/16  Yes Everlene FarrierWilliam Dansie, PA-C  acetaminophen (TYLENOL) 500 MG tablet Take 1 tablet (500 mg total) by mouth every 6 (six) hours as needed. 08/09/16   Everlene FarrierWilliam Dansie, PA-C    Family History History reviewed. No pertinent family history.  Social History Social History  Substance Use Topics  . Smoking status: Former Smoker    Packs/day: 0.25    Types: Cigarettes    Quit date: 07/29/2016  . Smokeless tobacco: Never Used  . Alcohol use No     Allergies   Aspirin and Contrast media [iodinated diagnostic agents]   Review of Systems Review of Systems  Constitutional: Negative for fever.  HENT: Positive for dental problem.   Gastrointestinal: Positive for nausea and vomiting.     Physical Exam Updated Vital Signs BP 111/73 (BP Location: Left Arm)   Pulse 81   Temp 99 F (37.2 C) (Oral)   Resp 18   Ht 5\' 5"  (1.651 m)   Wt 149 lb (67.6 kg)   LMP 07/25/2016   SpO2 100%   BMI 24.79 kg/m   Physical Exam  Constitutional: She is oriented to person, place, and time. She appears well-developed and well-nourished. No distress.  HENT:  Head: Normocephalic and atraumatic.  S/p tooth extraction of right lower first premolar No fluctuance or discharge mild TTP  Eyes: Conjunctivae are normal.  Cardiovascular: Normal rate.   Pulmonary/Chest: Effort normal.  Abdominal: She exhibits no distension.  Neurological: She is alert and oriented to person, place, and time.  Skin: Skin is warm and dry.  Psychiatric: She has a normal mood and affect.  Nursing note and vitals reviewed.    ED Treatments / Results  DIAGNOSTIC STUDIES:  Oxygen Saturation is 100% on RA, normal by my interpretation.    COORDINATION OF CARE:  10:29 PM Discussed treatment plan with pt at bedside and pt agreed to plan.  Labs (all labs ordered are listed, but only abnormal results are displayed) Labs Reviewed - No data to  display  EKG  EKG Interpretation None       Radiology No results found.  Procedures Procedures (including critical care time)  Medications Ordered in ED Medications - No data to display   Initial Impression / Assessment and Plan / ED Course  I have reviewed the triage vital signs and the nursing notes.  Pertinent labs & imaging results that were available during my care of the patient were reviewed by me and considered in my medical decision making (see chart for details).    Patient with toothache.  No gross abscess.  Exam unconcerning for Ludwig's angina or spread of infection.  Will treat with penicillin and pain medicine.  Urged patient to follow-up with dentist.     Final Clinical Impressions(s) / ED Diagnoses   Final diagnoses:  Pain, dental    New Prescriptions New Prescriptions   No medications on file  I personally performed the services described in this documentation, which was scribed in my presence. The recorded information has been reviewed and is accurate.      Arthor Captain, PA-C 08/16/16 1610    Linwood Dibbles, MD 08/19/16 [phone]

## 2016-08-13 NOTE — ED Notes (Signed)
Updated pt to wait time 

## 2016-08-13 NOTE — Discharge Instructions (Signed)
Get help right away if: You have a fever or chills. Your symptoms suddenly get worse. You have a very bad headache. You have problems breathing or swallowing. You have trouble opening your mouth. You have swelling in your neck or around your eye. 

## 2016-08-13 NOTE — ED Notes (Signed)
ED Provider at bedside. 

## 2016-09-13 ENCOUNTER — Encounter (HOSPITAL_BASED_OUTPATIENT_CLINIC_OR_DEPARTMENT_OTHER): Payer: Self-pay | Admitting: Emergency Medicine

## 2016-09-13 ENCOUNTER — Emergency Department (HOSPITAL_BASED_OUTPATIENT_CLINIC_OR_DEPARTMENT_OTHER): Payer: Self-pay

## 2016-09-13 ENCOUNTER — Emergency Department (HOSPITAL_BASED_OUTPATIENT_CLINIC_OR_DEPARTMENT_OTHER)
Admission: EM | Admit: 2016-09-13 | Discharge: 2016-09-13 | Disposition: A | Discharge status: Home / Self Care | Payer: Self-pay | Attending: Emergency Medicine | Admitting: Emergency Medicine

## 2016-09-13 DIAGNOSIS — Z87891 Personal history of nicotine dependence: Secondary | ICD-10-CM | POA: Insufficient documentation

## 2016-09-13 DIAGNOSIS — M546 Pain in thoracic spine: Secondary | ICD-10-CM | POA: Insufficient documentation

## 2016-09-13 DIAGNOSIS — M549 Dorsalgia, unspecified: Secondary | ICD-10-CM

## 2016-09-13 DIAGNOSIS — Z79899 Other long term (current) drug therapy: Secondary | ICD-10-CM | POA: Insufficient documentation

## 2016-09-13 LAB — URINALYSIS, ROUTINE W REFLEX MICROSCOPIC
BILIRUBIN URINE: NEGATIVE
GLUCOSE, UA: NEGATIVE mg/dL
Hgb urine dipstick: NEGATIVE
Ketones, ur: NEGATIVE mg/dL
LEUKOCYTES UA: NEGATIVE
Nitrite: NEGATIVE
Protein, ur: NEGATIVE mg/dL
SPECIFIC GRAVITY, URINE: 1.025 (ref 1.005–1.030)
pH: 7 (ref 5.0–8.0)

## 2016-09-13 LAB — PREGNANCY, URINE: Preg Test, Ur: NEGATIVE

## 2016-09-13 MED ORDER — LIDOCAINE 5 % EX PTCH: 1.0000 | MEDICATED_PATCH | CUTANEOUS | 0 refills | Status: DC

## 2016-09-13 MED ORDER — METHOCARBAMOL 500 MG PO TABS: 500.0000 mg | ORAL_TABLET | Freq: Two times a day (BID) | ORAL | 0 refills | Status: DC
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
## 2016-09-13 NOTE — Discharge Instructions (Signed)
There were no acute abnormalities on the x-ray or urinalysis.  For the back discomfort: Take it easy, but do not lay around too much as this may make any stiffness worse.  Antiinflammatory medications: Take 500 mg of naproxen every 12 hours or 600 mg of ibuprofen every 6 hours for the next 3 days. Take these medications with food to avoid upset stomach. Choose only one of these medications, do not take them together.  Muscle relaxer: Robaxin is a muscle relaxer and may help loosen stiff muscles. Do not take the Robaxin while driving or performing other dangerous activities.   Lidocaine patches: These are available via either prescription or over-the-counter. The over-the-counter option may be more economical one and are likely just as effective. There are multiple over-the-counter brands, such as Salonpas.  Exercises: Be sure to perform the attached exercises starting with three times a week and working up to performing them daily. This is an essential part of preventing long term problems.   Follow up with a primary care provider for any future management of these complaints.  For OBGYN follow up: Call the number provided to set up an appointment.

## 2017-01-19 ENCOUNTER — Emergency Department (HOSPITAL_BASED_OUTPATIENT_CLINIC_OR_DEPARTMENT_OTHER)
Admission: EM | Admit: 2017-01-19 | Discharge: 2017-01-19 | Disposition: A | Discharge status: Home / Self Care | Payer: Self-pay | Attending: Emergency Medicine | Admitting: Emergency Medicine

## 2017-01-19 ENCOUNTER — Encounter (HOSPITAL_BASED_OUTPATIENT_CLINIC_OR_DEPARTMENT_OTHER): Payer: Self-pay | Admitting: *Deleted

## 2017-01-19 DIAGNOSIS — H9201 Otalgia, right ear: Secondary | ICD-10-CM | POA: Insufficient documentation

## 2017-01-19 DIAGNOSIS — R51 Headache: Secondary | ICD-10-CM | POA: Insufficient documentation

## 2017-01-19 DIAGNOSIS — Z79899 Other long term (current) drug therapy: Secondary | ICD-10-CM | POA: Insufficient documentation

## 2017-01-19 DIAGNOSIS — F17210 Nicotine dependence, cigarettes, uncomplicated: Secondary | ICD-10-CM | POA: Insufficient documentation

## 2017-01-19 NOTE — Discharge Instructions (Signed)
As we discussed, continue taking Tylenol or ibuprofen as needed for pain.  Continue taking allergy medications and see if that helps with symptoms.  Follow-up with referred primary care clinic for further evaluation.   Return the emergency Department for any worsening pain, vision changes, rash, chest pain, difficulty breathing, numbness/weakness of her arms or legs, difficulty walking or any other worsening or concerning symptoms.

## 2017-01-19 NOTE — ED Triage Notes (Signed)
Pt reports R ear pain x2days. Denies fever, n/v/d, drainage from ear. Reports intermittent cold symptoms.

## 2017-01-19 NOTE — ED Provider Notes (Signed)
[facility]-EMERGENCY DEPT Provider Note   CSN: [id number] Arrival date & time: 01/19/17  1648  By signing my name below, I, Linna Darner, attest that this documentation has been prepared under the direction and in the presence of Graciella Freer, PA-C. Electronically Signed: Linna Darner, Scribe. 01/19/2017. 6:25 PM.  History   Chief Complaint Chief Complaint  Patient presents with  . Otalgia   The history is provided by the patient. No language interpreter was used.    HPI Comments: Patricia Wilcox is a 36 y.o. female who presents to the Emergency Department complaining of persistent right ear pain beginning two days ago. She has also reports developing an intermittent, gradually worsening right sided headache (around the ear area) since onset of her pain. Patient reports that currently headache has improved. Patient also reports some mildly decreased hearing, stating that "if feels stuffy" in the right ear. Patient has tried Aleve without relief. She has no known ill contacts. No recent swimming. Patient experienced right ear pain about 6 weeks ago and was told by a physician that she had fluid in the ear. The pain eventually resolved on its own. She has a history of migraines but notes that they are usually constant and generalized. Patient does note a history of allergies but states that she didn't like taking allergy medication so she stopped. Patient notes that she has had on/off nasal congestion, rhinorrhea, and post nasal drip. She denies any fall, trauma, or injury. Patient denies ear discharge, fevers, chills, sore throat, facial swelling, cough, dyspnea, chest pain, nausea, vomiting, rashes, vision changes, numbness/weakness of her arms or legs, difficulty ambulating or any other associated symptoms.  Past Medical History:  Diagnosis Date  . Anemia   . Anxiety   . Anxiety   . Depression   . GERD (gastroesophageal reflux disease)   . High cholesterol   . Insomnia   . Palpitation     . PTSD (post-traumatic stress disorder)     Patient Active Problem List   Diagnosis Date Noted  . Chest pain 04/23/2013  . Anemia   . High cholesterol   . Anxiety   . Depression   . Insomnia   . Palpitation   . GERD (gastroesophageal reflux disease)     Past Surgical History:  Procedure Laterality Date  . BREAST REDUCTION SURGERY    . CHOLECYSTECTOMY    . gallstone removal    . WISDOM TOOTH EXTRACTION      OB History    No data available       Home Medications    Prior to Admission medications   Medication Sig Start Date End Date Taking? Authorizing Provider  acetaminophen (TYLENOL) 500 MG tablet Take 1 tablet (500 mg total) by mouth every 6 (six) hours as needed. 08/09/16   Everlene Farrier, PA-C  amoxicillin (AMOXIL) 500 MG capsule Take 1 capsule (500 mg total) by mouth 3 (three) times daily. 08/13/16   Arthor Captain, PA-C  cetirizine (ZYRTEC ALLERGY) 10 MG tablet Take 1 tablet (10 mg total) by mouth daily. 08/09/16   Everlene Farrier, PA-C  fluticasone (FLONASE) 50 MCG/ACT nasal spray Place 2 sprays into both nostrils daily. 08/09/16   Everlene Farrier, PA-C  lidocaine (LIDODERM) 5 % Place 1 patch onto the skin daily. Remove & Discard patch within 12 hours or as directed by MD 09/13/16   Anselm Pancoast, PA-C  meloxicam (MOBIC) 15 MG tablet Take 1 tablet (15 mg total) by mouth daily. Take 1 daily with food. 08/13/16  Harris, Abigail, PA-C  methocarbamol (ROBAXIN) 500 MG tablet Take 1 tablet (500 mg total) by mouth 2 (two) times daily. 09/13/16   Joy, Shawn C, PA-C  omeprazole (PRILOSEC) 20 MG capsule Take 1 capsule (20 mg total) by mouth daily. 08/09/16   Everlene Farrieransie, William, PA-C    Family History No family history on file.  Social History Social History  Substance Use Topics  . Smoking status: Current Some Day Smoker    Packs/day: 0.25    Types: Cigarettes  . Smokeless tobacco: Never Used  . Alcohol use No     Allergies   Aspirin and Contrast media [iodinated  diagnostic agents]   Review of Systems Review of Systems  Constitutional: Negative for chills and fever.  HENT: Positive for ear pain (R) and hearing loss (R). Negative for congestion, ear discharge, facial swelling, rhinorrhea and sore throat.   Eyes: Negative for visual disturbance.  Respiratory: Negative for cough and shortness of breath.   Cardiovascular: Negative for chest pain.  Gastrointestinal: Negative for nausea and vomiting.  Skin: Negative for rash.  Neurological: Positive for headaches. Negative for weakness and numbness.   Physical Exam Updated Vital Signs BP 98/66 (BP Location: Left Arm)   Pulse 78   Temp 98.8 F (37.1 C) (Oral)   Resp 16   LMP 12/16/2016 (Exact Date)   SpO2 100%   Physical Exam  Constitutional: She is oriented to person, place, and time. She appears well-developed and well-nourished. No distress.  Sitting comfortably on examination table  HENT:  Head: Normocephalic and atraumatic.  Right Ear: Tympanic membrane and ear canal normal. Tympanic membrane is not injected, not erythematous and not bulging. No hemotympanum.  Left Ear: Tympanic membrane and ear canal normal. Tympanic membrane is not injected, not erythematous and not bulging. No hemotympanum.  Nose: Nose normal. Right sinus exhibits no maxillary sinus tenderness and no frontal sinus tenderness. Left sinus exhibits no maxillary sinus tenderness and no frontal sinus tenderness.  Mouth/Throat: Oropharynx is clear and moist and mucous membranes are normal.  Pale, boggy nasal mucosa. Bilateral TM's have normal anatomical landmarks with good cones of light. No tenderness to the mastoid process bilaterally. No overlying warmth or erythema noted to the mastoid process. No evidence of effusion.   Eyes: Pupils are equal, round, and reactive to light. Conjunctivae, EOM and lids are normal.  Neck: Full passive range of motion without pain. Neck supple. No neck rigidity. No tracheal deviation present.    Full flexion/extension and lateral movement of neck fully intact. No bony midline tenderness. No deformities or crepitus.   Cardiovascular: Normal rate, regular rhythm and normal pulses.   Pulmonary/Chest: Effort normal. No respiratory distress.  Musculoskeletal: Normal range of motion.  Neurological: She is alert and oriented to person, place, and time. She has normal strength. No cranial nerve deficit or sensory deficit. Coordination normal.  Cranial nerves III-XII intact Follows commands, Moves all extremities  5/5 strength to BUE and BLE  Sensation intact throughout  Normal finger to nose. No dysdiadochokinesia. No pronator drift. No gait abnormalities  No slurred speech. No facial droop.   Skin: Skin is warm and dry.  Psychiatric: She has a normal mood and affect. Her behavior is normal.  Nursing note and vitals reviewed.  ED Treatments / Results  Labs (all labs ordered are listed, but only abnormal results are displayed) Labs Reviewed - No data to display  EKG  EKG Interpretation None       Radiology No results found.  Procedures Procedures (including critical care time)  DIAGNOSTIC STUDIES: Oxygen Saturation is 100% on RA, normal by my interpretation.    COORDINATION OF CARE: 6:25 PM Discussed treatment plan with pt at bedside and pt agreed to plan.  Medications Ordered in ED Medications - No data to display   Initial Impression / Assessment and Plan / ED Course  I have reviewed the triage vital signs and the nursing notes.  Pertinent labs & imaging results that were available during my care of the patient were reviewed by me and considered in my medical decision making (see chart for details).     36 y.o. F who presents with 2 days of right ear pain. Also had intermittent right headache. Recently diagnosed with ear effusion. Patient is afebrile, non-toxic appearing, sitting comfortably on examination table. Vital signs reviewed and stable. No neuro  deficits on exam. History/physical exam are not concerning for AOM, mastoiditis, pharyngitis. Consider allergies vs sinusitis. History/physical exam are not concerning ICH, Cavernous sinus thrombosis, meningitis. Offered to do a CT head for evaluation of headache but patient declined at this time. Given gradual and intermittent nature, do not suspect SAH> Conservative therapies discussed. Provided patient with a list of clinic resources to use if he does not have a PCP. Instructed to call them today to arrange follow-up in the next 24-48 hours. Return precautions discussed. Patient expresses understanding and agreement to plan.    Final Clinical Impressions(s) / ED Diagnoses   Final diagnoses:  Right ear pain    New Prescriptions Discharge Medication List as of 01/19/2017  6:26 PM     I personally performed the services described in this documentation, which was scribed in my presence. The recorded information has been reviewed and is accurate.    Maxwell CaulLayden, Skye Rodarte A, PA-C 01/21/17 2120    Linwood DibblesKnapp, Jon, MD 01/24/17 [phone]

## 2017-03-03 ENCOUNTER — Encounter (HOSPITAL_BASED_OUTPATIENT_CLINIC_OR_DEPARTMENT_OTHER): Payer: Self-pay | Admitting: *Deleted

## 2017-03-03 ENCOUNTER — Emergency Department (HOSPITAL_BASED_OUTPATIENT_CLINIC_OR_DEPARTMENT_OTHER)
Admission: EM | Admit: 2017-03-03 | Discharge: 2017-03-04 | Discharge status: Against Medical Advice | Payer: Self-pay | Attending: Emergency Medicine | Admitting: Emergency Medicine

## 2017-03-03 ENCOUNTER — Emergency Department (HOSPITAL_BASED_OUTPATIENT_CLINIC_OR_DEPARTMENT_OTHER): Payer: Self-pay

## 2017-03-03 DIAGNOSIS — F17210 Nicotine dependence, cigarettes, uncomplicated: Secondary | ICD-10-CM | POA: Insufficient documentation

## 2017-03-03 DIAGNOSIS — R079 Chest pain, unspecified: Secondary | ICD-10-CM | POA: Insufficient documentation

## 2017-03-03 DIAGNOSIS — Z79899 Other long term (current) drug therapy: Secondary | ICD-10-CM | POA: Insufficient documentation

## 2017-03-03 NOTE — ED Triage Notes (Addendum)
Pt c/o chest "tightness " with deep breathing  x 1 day ago

## 2017-03-03 NOTE — ED Provider Notes (Signed)
[facility]   CSN: [id number] Arrival date & time: 03/03/17  2011     History   Chief Complaint Chief Complaint  Patient presents with  . Chest Pain    HPI Patricia ParsleyJamie D Wilcox is a 36 y.o. female.  Patient is a 36 year old female with past mental history of anemia, anxiety, and depression. She presents today with complaints of chest discomfort. This started yesterday evening in the absence of any injury or trauma. She describes it as a sharp pain across the front of her chest that is worse when she breathes and pushes on the area. She does report vomiting that occurred yesterday evening prior to the onset of symptoms. She denies any shortness of breath, diaphoresis, or radiation of her pain to the arm or jaw. She denies any recent exertional symptoms.  She does have a positive family history for cardiac disease and smokes, however has no other cardiac risk factors.   The history is provided by the patient.  Chest Pain   This is a new problem. The current episode started yesterday. The problem occurs constantly. The problem has been gradually worsening. The pain is associated with movement (Breathing). The pain is mild. The quality of the pain is described as sharp. The pain does not radiate. Pertinent negatives include no cough, no diaphoresis, no dizziness, no palpitations, no shortness of breath and no sputum production.    Past Medical History:  Diagnosis Date  . Anemia   . Anxiety   . Anxiety   . Depression   . GERD (gastroesophageal reflux disease)   . High cholesterol   . Insomnia   . Palpitation   . PTSD (post-traumatic stress disorder)     Patient Active Problem List   Diagnosis Date Noted  . Chest pain 04/23/2013  . Anemia   . High cholesterol   . Anxiety   . Depression   . Insomnia   . Palpitation   . GERD (gastroesophageal reflux disease)     Past Surgical History:  Procedure Laterality Date  . BREAST REDUCTION SURGERY    .  CHOLECYSTECTOMY    . gallstone removal    . WISDOM TOOTH EXTRACTION      OB History    No data available       Home Medications    Prior to Admission medications   Medication Sig Start Date End Date Taking? Authorizing Provider  acetaminophen (TYLENOL) 500 MG tablet Take 1 tablet (500 mg total) by mouth every 6 (six) hours as needed. 08/09/16   Everlene Farrieransie, William, PA-C  cetirizine (ZYRTEC ALLERGY) 10 MG tablet Take 1 tablet (10 mg total) by mouth daily. 08/09/16   Everlene Farrieransie, William, PA-C  fluticasone (FLONASE) 50 MCG/ACT nasal spray Place 2 sprays into both nostrils daily. 08/09/16   Everlene Farrieransie, William, PA-C  lidocaine (LIDODERM) 5 % Place 1 patch onto the skin daily. Remove & Discard patch within 12 hours or as directed by MD 09/13/16   Anselm PancoastJoy, Shawn C, PA-C  meloxicam (MOBIC) 15 MG tablet Take 1 tablet (15 mg total) by mouth daily. Take 1 daily with food. 08/13/16   Arthor CaptainHarris, Abigail, PA-C  methocarbamol (ROBAXIN) 500 MG tablet Take 1 tablet (500 mg total) by mouth 2 (two) times daily. 09/13/16   Joy, Shawn C, PA-C  omeprazole (PRILOSEC) 20 MG capsule Take 1 capsule (20 mg total) by mouth daily. 08/09/16   Everlene Farrieransie, William, PA-C    Family History History reviewed. No pertinent family history.  Social History  Social History  Substance Use Topics  . Smoking status: Current Some Day Smoker    Packs/day: 0.50    Types: Cigarettes  . Smokeless tobacco: Never Used  . Alcohol use No     Allergies   Aspirin and Contrast media [iodinated diagnostic agents]   Review of Systems Review of Systems  Constitutional: Negative for diaphoresis.  Respiratory: Negative for cough, sputum production and shortness of breath.   Cardiovascular: Positive for chest pain. Negative for palpitations.  Neurological: Negative for dizziness.  All other systems reviewed and are negative.    Physical Exam Updated Vital Signs BP 115/87   Pulse (!) 56   Temp 98.3 F (36.8 C)   Resp 18   Ht  (1.651 m)   Wt  68 kg (150 lb)   LMP 02/28/2017   SpO2 92%   BMI 24.96 kg/m   Physical Exam  Constitutional: She is oriented to person, place, and time. She appears well-developed and well-nourished. No distress.  HENT:  Head: Normocephalic and atraumatic.  Neck: Normal range of motion. Neck supple.  Cardiovascular: Normal rate and regular rhythm.  Exam reveals no gallop and no friction rub.   No murmur heard. Pulmonary/Chest: Effort normal and breath sounds normal. No respiratory distress. She has no wheezes. She exhibits tenderness.  There is tenderness to palpation of the anterior chest wall. This reproduces her symptoms.  Abdominal: Soft. Bowel sounds are normal. She exhibits no distension. There is no tenderness.  Musculoskeletal: Normal range of motion.  Neurological: She is alert and oriented to person, place, and time.  Skin: Skin is warm and dry. She is not diaphoretic.  Nursing note and vitals reviewed.    ED Treatments / Results  Labs (all labs ordered are listed, but only abnormal results are displayed) Labs Reviewed  COMPREHENSIVE METABOLIC PANEL  LIPASE, BLOOD  CBC WITH DIFFERENTIAL/PLATELET  TROPONIN I    EKG  EKG Interpretation  Date/Time:  Monday March 03 2017 20:21:41 EDT Ventricular Rate:  74 PR Interval:  160 QRS Duration: 84 QT Interval:  368 QTC Calculation: 408 R Axis:   76 Text Interpretation:  Normal sinus rhythm Normal ECG Confirmed by Geoffery Lyons ([id number]) on 03/03/2017 11:49:13 PM       Radiology Dg Chest 2 View  Result Date: 03/03/2017 CLINICAL DATA:  Chest pain and chest tightness with deep inspiration and weakness x1 day. Smoker. EXAM: CHEST  2 VIEW COMPARISON:  09/13/2016 FINDINGS: The heart size and mediastinal contours are within normal limits. Both lungs are clear. The visualized skeletal structures are unremarkable. IMPRESSION: No active cardiopulmonary disease. Electronically Signed   By: Tollie Eth M.D.   On: 03/03/2017 20:51     Procedures Procedures (including critical care time)  Medications Ordered in ED Medications - No data to display   Initial Impression / Assessment and Plan / ED Course  I have reviewed the triage vital signs and the nursing notes.  Pertinent labs & imaging results that were available during my care of the patient were reviewed by me and considered in my medical decision making (see chart for details).  Patient presents here with complaints of chest discomfort for the past 2 days. Her symptoms sound musculoskeletal in nature as it is worse with inspiration and reproducible with palpation. Her EKG is unchanged from prior studies. Remainder the workup is unremarkable. While awaiting results of the troponin, she apparently informed the nurse that her ride was here and that she had to leave. She then  signed out AGAINST MEDICAL ADVICE prior to my speaking with her again.  Final Clinical Impressions(s) / ED Diagnoses   Final diagnoses:  None    New Prescriptions New Prescriptions   No medications on file     Geoffery Lyons, MD 03/04/17 [phone]

## 2017-03-04 ENCOUNTER — Telehealth (HOSPITAL_BASED_OUTPATIENT_CLINIC_OR_DEPARTMENT_OTHER): Payer: Self-pay | Admitting: Emergency Medicine

## 2017-03-04 LAB — COMPREHENSIVE METABOLIC PANEL
ALBUMIN: 3.5 g/dL (ref 3.5–5.0)
ALT: 15 U/L (ref 14–54)
AST: 16 U/L (ref 15–41)
Alkaline Phosphatase: 52 U/L (ref 38–126)
Anion gap: 5 (ref 5–15)
BILIRUBIN TOTAL: 0.6 mg/dL (ref 0.3–1.2)
BUN: 15 mg/dL (ref 6–20)
CHLORIDE: 107 mmol/L (ref 101–111)
CO2: 27 mmol/L (ref 22–32)
CREATININE: 0.66 mg/dL (ref 0.44–1.00)
Calcium: 8.8 mg/dL — ABNORMAL LOW (ref 8.9–10.3)
GFR calc Af Amer: >60 mL/min (ref >60)
GFR calc non Af Amer: >60 mL/min (ref >60)
GLUCOSE: 64 mg/dL — AB (ref 65–99)
POTASSIUM: 3.6 mmol/L (ref 3.5–5.1)
Sodium: 139 mmol/L (ref 135–145)
TOTAL PROTEIN: 6.3 g/dL — AB (ref 6.5–8.1)

## 2017-03-04 LAB — CBC WITH DIFFERENTIAL/PLATELET
Basophils Absolute: 0 10*3/uL (ref 0.0–0.1)
Basophils Relative: 0 %
Eosinophils Absolute: 0.2 10*3/uL (ref 0.0–0.7)
Eosinophils Relative: 4 %
HEMATOCRIT: 33.3 % — AB (ref 36.0–46.0)
Hemoglobin: 10.7 g/dL — ABNORMAL LOW (ref 12.0–15.0)
LYMPHS PCT: 46 %
Lymphs Abs: 2.7 10*3/uL (ref 0.7–4.0)
MCH: 24 pg — ABNORMAL LOW (ref 26.0–34.0)
MCHC: 32.1 g/dL (ref 30.0–36.0)
MCV: 74.8 fL — AB (ref 78.0–100.0)
MONO ABS: 0.5 10*3/uL (ref 0.1–1.0)
MONOS PCT: 9 %
NEUTROS ABS: 2.4 10*3/uL (ref 1.7–7.7)
Neutrophils Relative %: 41 %
Platelets: 246 10*3/uL (ref 150–400)
RBC: 4.45 MIL/uL (ref 3.87–5.11)
RDW: 15.1 % (ref 11.5–15.5)
WBC: 5.8 10*3/uL (ref 4.0–10.5)

## 2017-03-04 LAB — LIPASE, BLOOD: Lipase: 34 U/L (ref 11–51)

## 2017-03-04 NOTE — ED Notes (Signed)
Informed nurse that has to leave, that her ride is out front. ED MD informed

## 2017-03-04 NOTE — Telephone Encounter (Signed)
Patient called looking for results of "Heart test" from last night. She attempted to call the Nurse Advise line for results, states "the phone just rings and rings" - Patient frustrated on the phone - Labs reviewed from chart and patient made aware that there is no result for the troponin, informed her that once she signed out this probably cancelled all lab processing. Informed that patient his may not be correct, however there are no results or pending results at this time .

## 2017-05-26 IMAGING — CT CT ABD-PELV W/O CM
2 of 4 series · 15 of 46 positions shown, 17 images · non-contrast
Comparison: 07/16/2013

CLINICAL DATA: Left flank pain with nausea.

EXAM:
CT ABDOMEN AND PELVIS WITHOUT CONTRAST
TECHNIQUE: Multidetector CT imaging of the abdomen and pelvis was performed
following the standard protocol without IV contrast.

[Series 2: axial st · axial · 0.80mm/px · z∈[+762,+1142]mm · 12 of 84 slices shown, 14 images]
[im 4/84  soft-tissue]
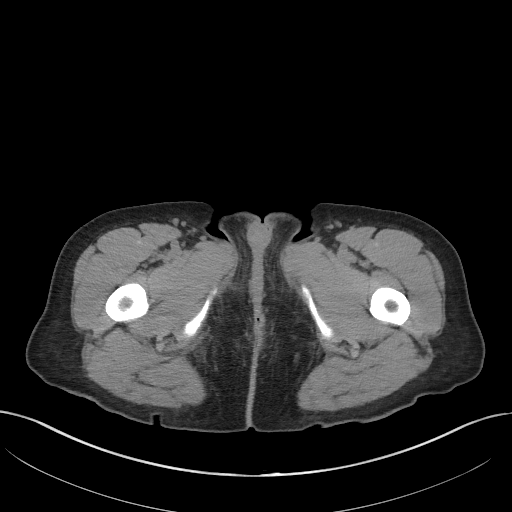
[im 4/84  bone]
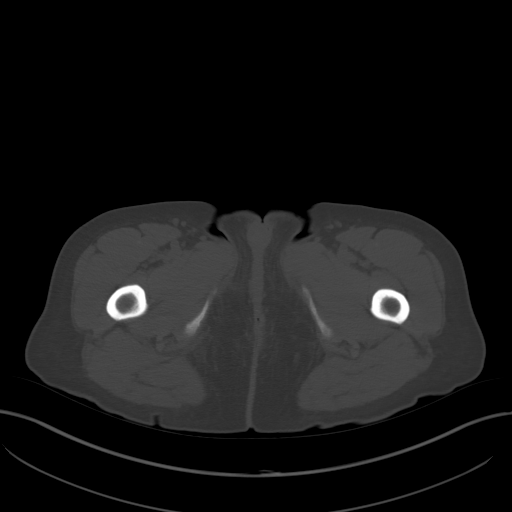
[im 12/84  soft-tissue]
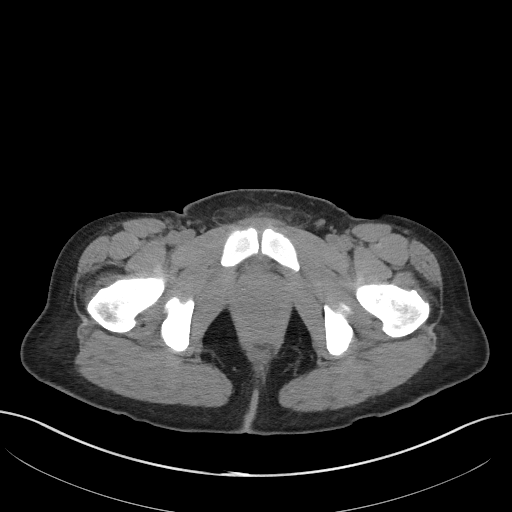
[im 19/84  soft-tissue]
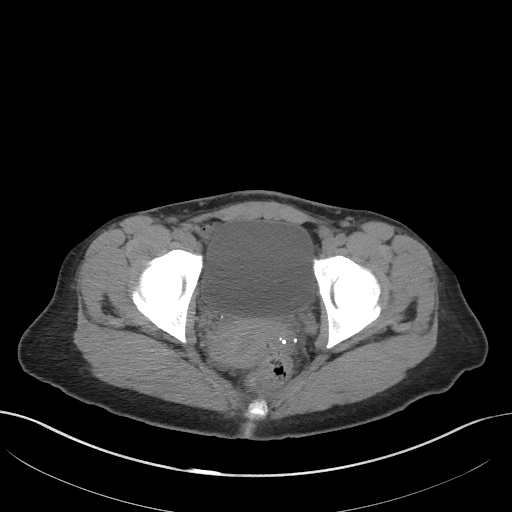
[im 27/84  soft-tissue]
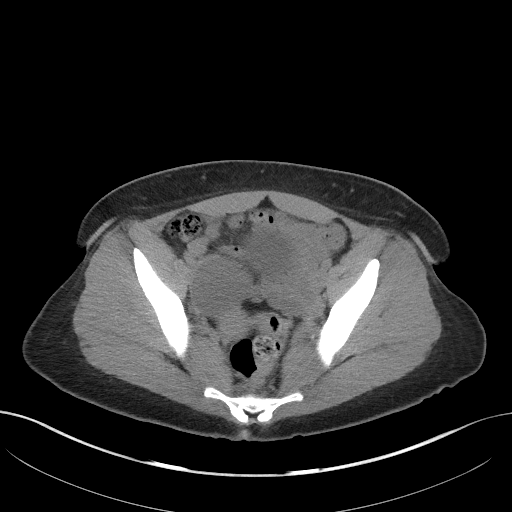
[im 31/84  soft-tissue]
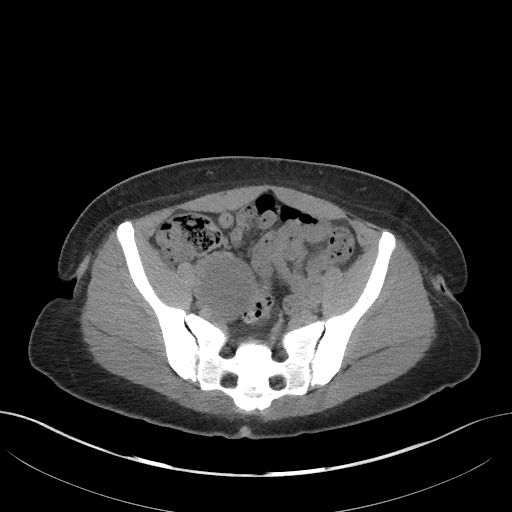
[im 38/84  soft-tissue]
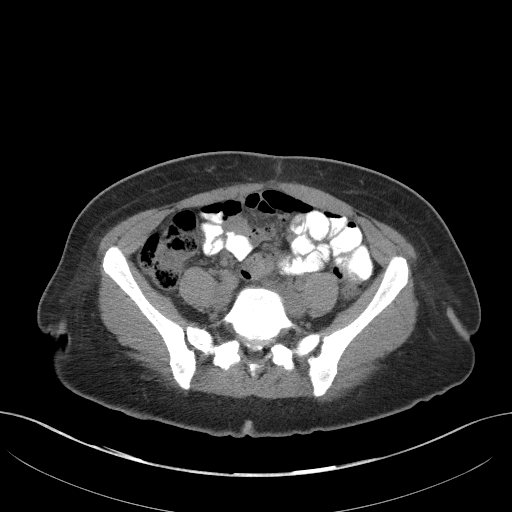
[im 46/84  soft-tissue]
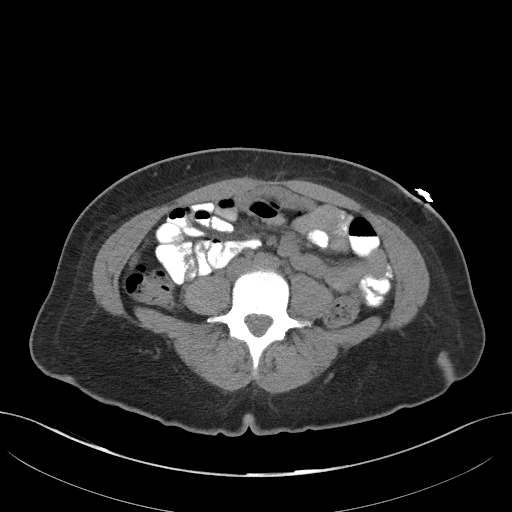
[im 53/84  soft-tissue]
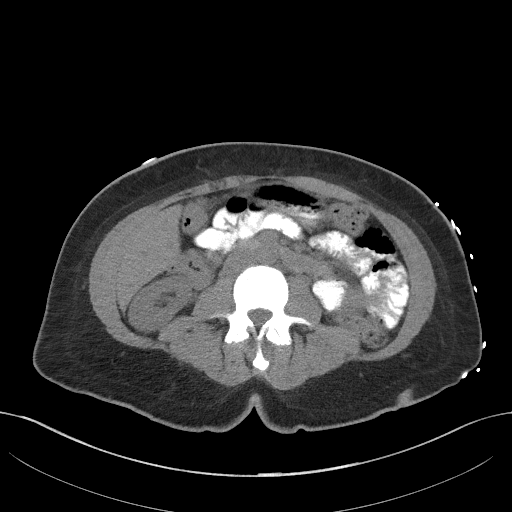
[im 57/84  soft-tissue]
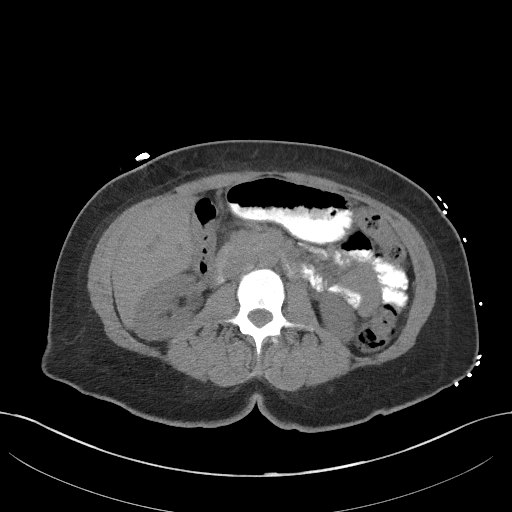
[im 57/84  bone]
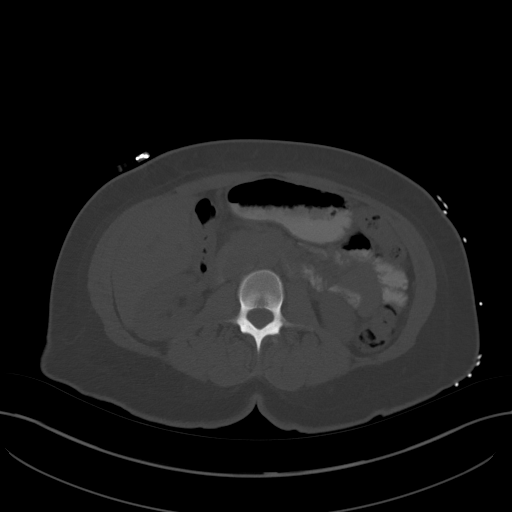
[im 65/84  soft-tissue]
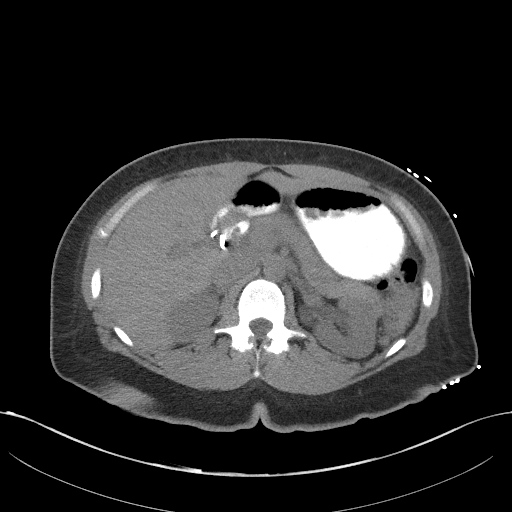
[im 72/84  soft-tissue]
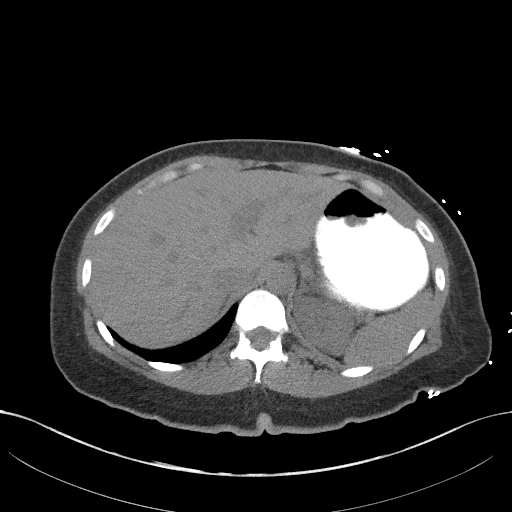
[im 80/84  soft-tissue]
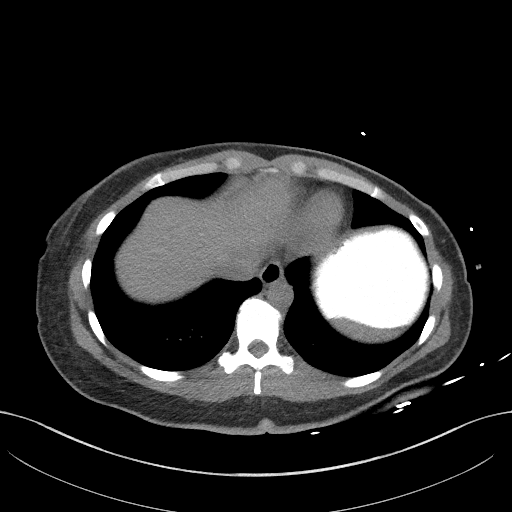

[Series 5: coronal st · coronal · 0.66mm/px · 3 of 79 slices shown]
[im 27/79  soft-tissue]
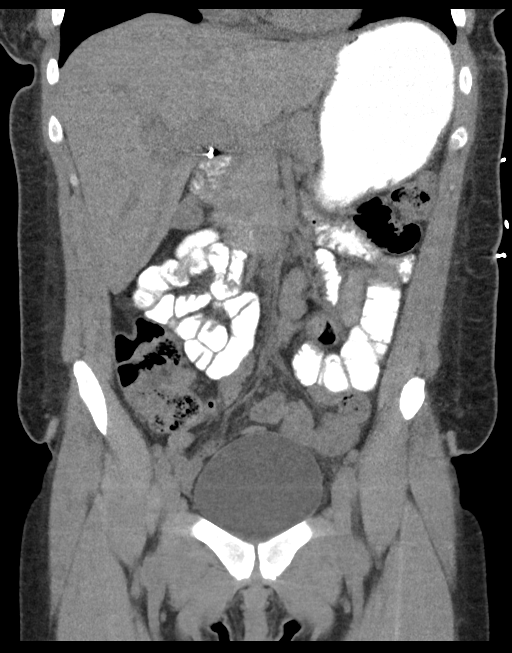
[im 35/79  soft-tissue]
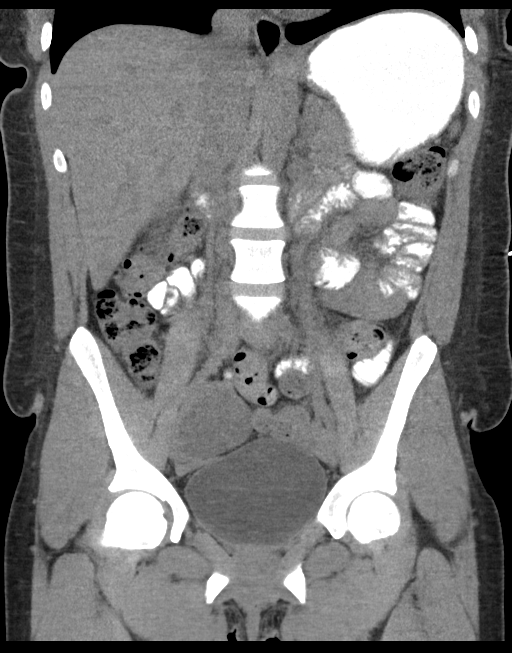
[im 44/79  soft-tissue]
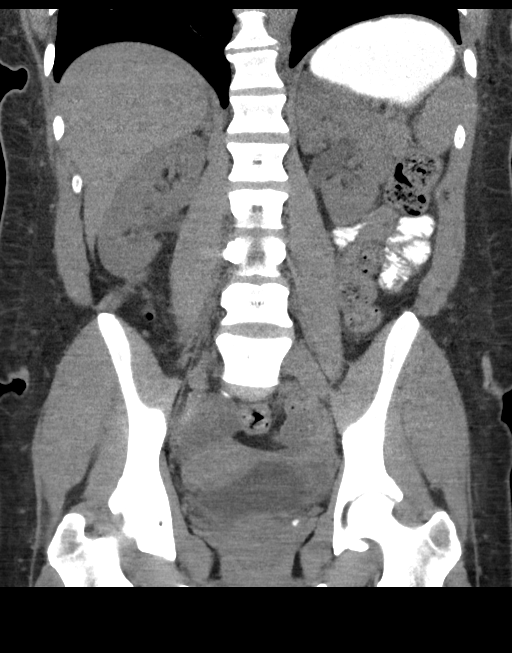

[15 of 46 positions shown; findings below may reference images not displayed]

FINDINGS: Lower chest:  Unremarkable.

Hepatobiliary: No focal abnormality in the liver on this study
without intravenous contrast. No evidence of hepatomegaly.
Gallbladder surgically absent. No intrahepatic or extrahepatic
biliary dilation.

Pancreas: No focal mass lesion. No dilatation of the main duct. No
intraparenchymal cyst. No peripancreatic edema.

Spleen: No splenomegaly. No focal mass lesion.

Adrenals/Urinary Tract: No adrenal nodule or mass. No renal stones.
No hydronephrosis in either kidney. No evidence for hydroureter. No
definite ureteral stones. Almost all of the phleboliths in the
anatomic pelvis are present on the previous study except for 1
left-sided calcifications seen on image 64 of series 2. This could
potentially be a stone in the distal left ureter but there no
substantial secondary changes in the left ureter or kidney. No
evidence for bladder stones.

Stomach/Bowel: Stomach is nondistended. No gastric wall thickening.
No evidence of outlet obstruction. Duodenum is normally positioned
as is the ligament of Treitz. No small bowel wall thickening. No
small bowel dilatation. The terminal ileum is normal. The appendix
is normal. No gross colonic mass. No colonic wall thickening. No
substantial diverticular change.

Vascular/Lymphatic: No abdominal aortic aneurysm. No abdominal
atherosclerotic calcification. There is no gastrohepatic or
hepatoduodenal ligament lymphadenopathy. No intraperitoneal or
retroperitoneal lymphadenopathy. No pelvic sidewall lymphadenopathy.

Reproductive: The uterus has normal CT imaging appearance. 5.0 x
cm cystic lesion identified in the right ovary. Dominant follicle
noted in the left ovary.

Other: Trace free fluid noted in the cul-de-sac.

Musculoskeletal: Bone windows reveal no worrisome lytic or sclerotic
osseous lesions.
IMPRESSION: 1. No definite urinary stone disease. The patient has multiple
phleboliths in the anatomic pelvis in the distal ureters are not
well seen. There is a single calcification in the left inferior
pelvis that was not present on the previous study and while likely a
phlebolith given the lack of secondary changes in the left kidney
and ureter, a distal left ureteral stone is not entirely excluded.
2. 5.6 cm cystic lesion in the right ovary. At this size threshold,
follow-up ultrasound in 6-12 weeks is recommended to ensure
resolution. This recommendation follows ACR consensus guidelines:
White Paper of the ACR Incidental Findings Committee II on Adnexal
Findings. [HOSPITAL] [DATE].

## 2017-05-26 IMAGING — CR DG CHEST 2V
2 series · 2 of 2 positions shown · non-contrast
Comparison: 06/06/2015

CLINICAL DATA: Left chest pain, initial encounter

EXAM:
CHEST  2 VIEW

[w chest pa]
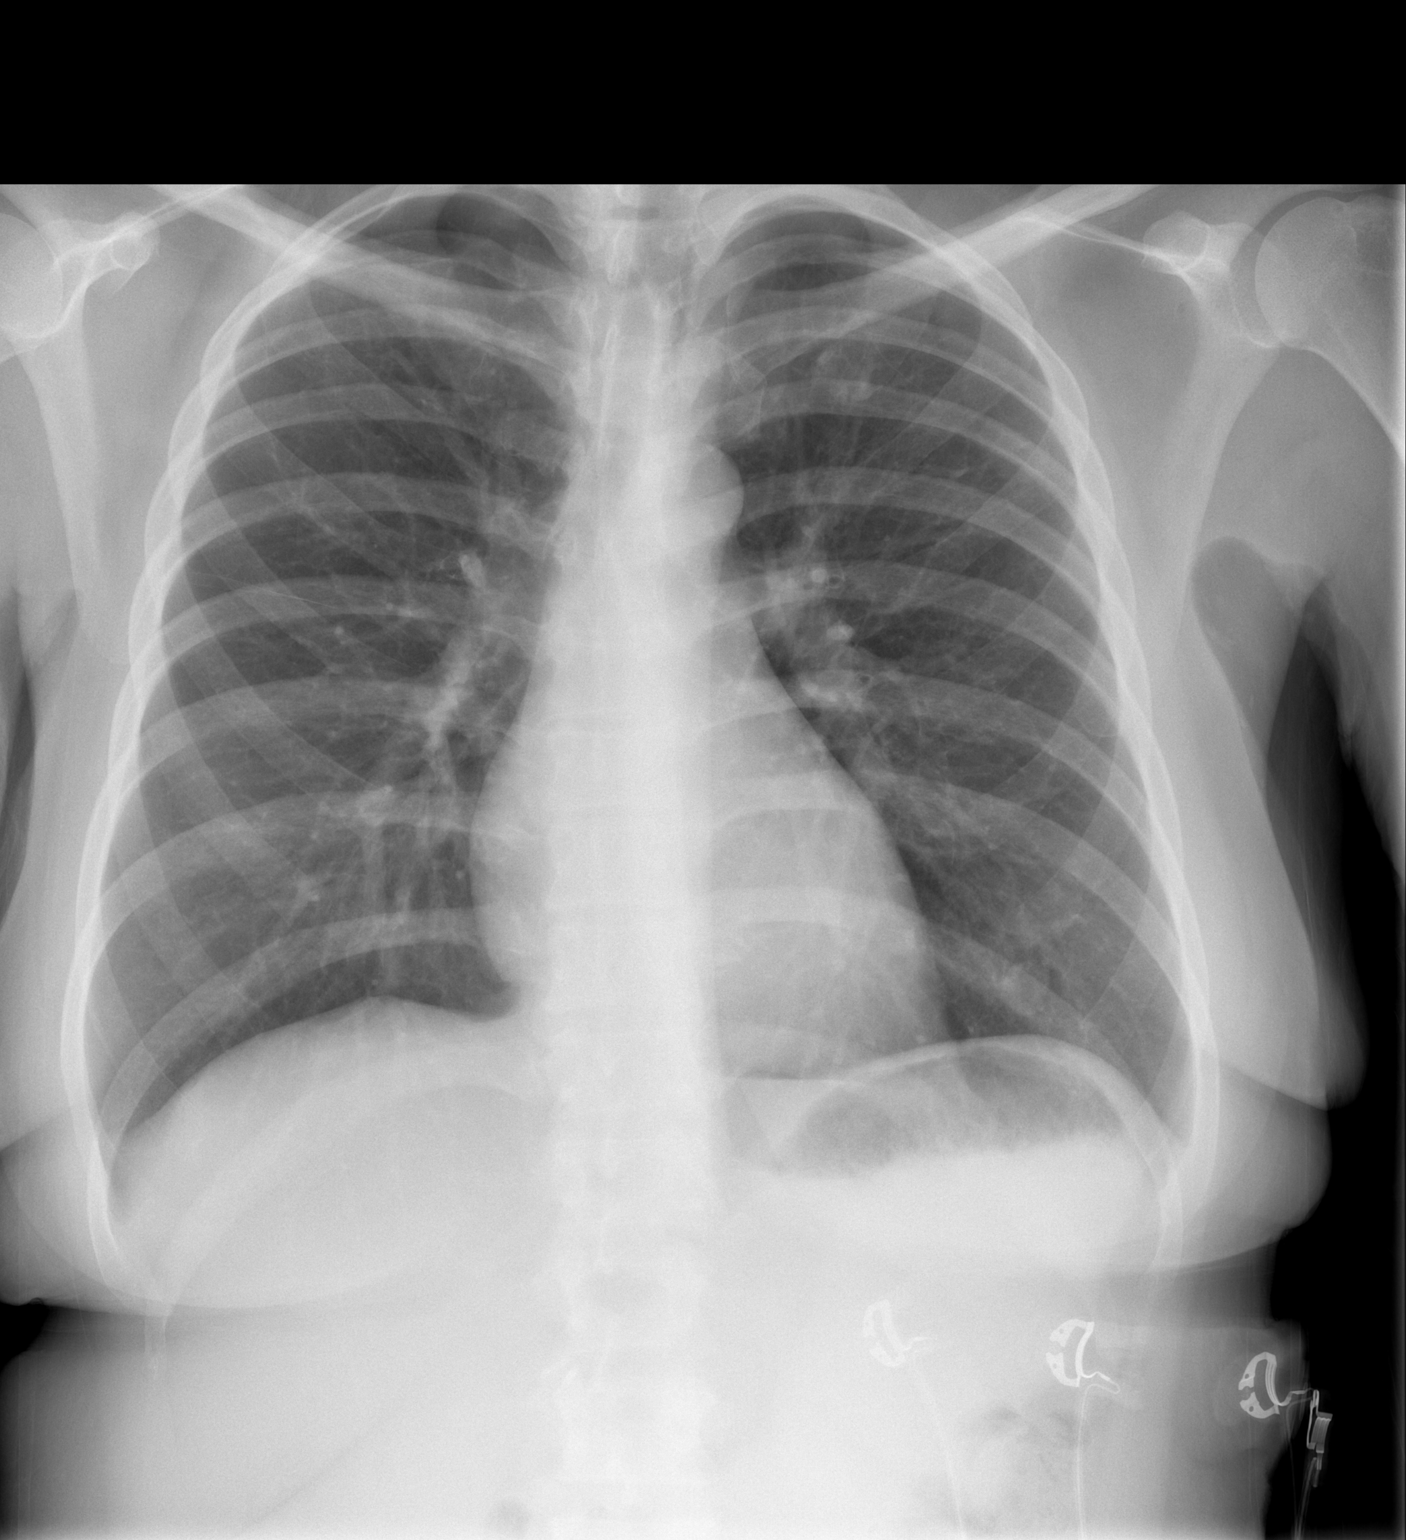

[w chest lat]
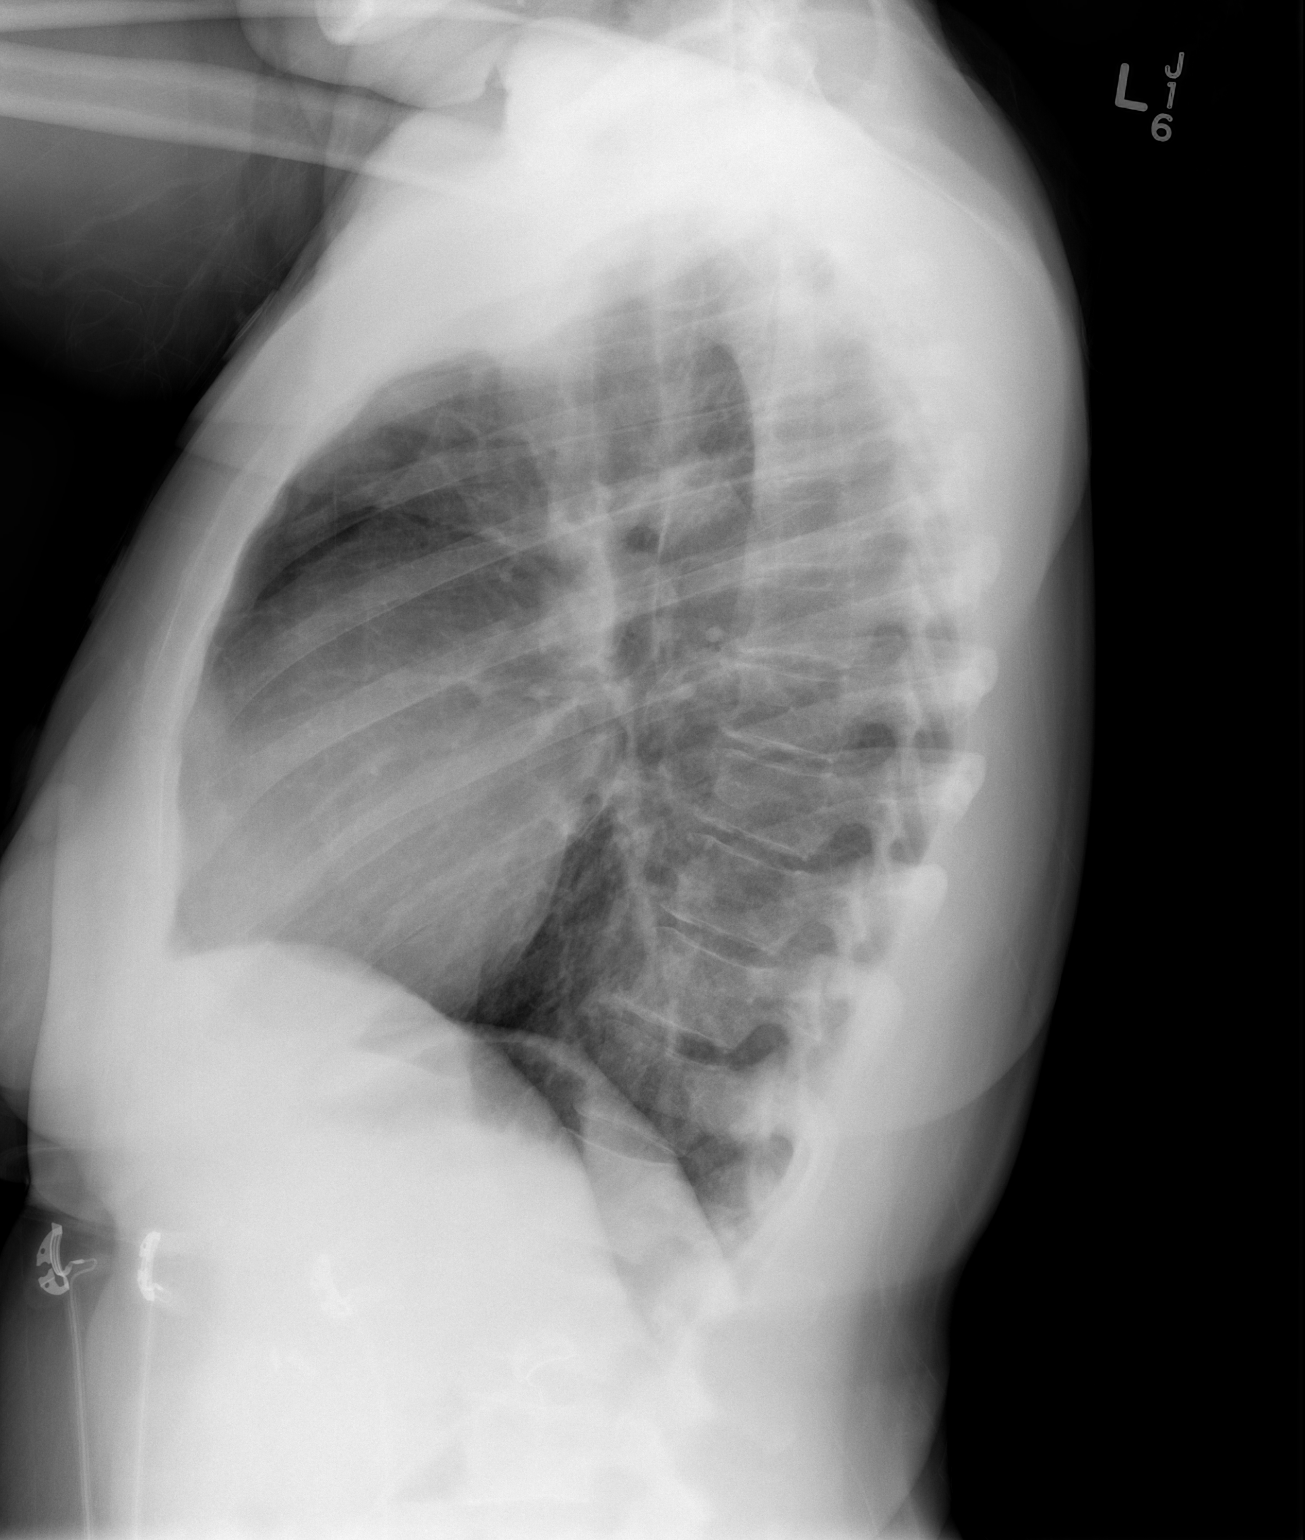

[2 of 2 positions shown; findings below may reference images not displayed]

FINDINGS: The heart size and mediastinal contours are within normal limits.
Both lungs are clear. The visualized skeletal structures are
unremarkable.
IMPRESSION: No active cardiopulmonary disease.

## 2017-06-16 ENCOUNTER — Encounter (HOSPITAL_BASED_OUTPATIENT_CLINIC_OR_DEPARTMENT_OTHER): Payer: Self-pay | Admitting: Emergency Medicine

## 2017-06-16 ENCOUNTER — Other Ambulatory Visit: Payer: Self-pay

## 2017-06-16 ENCOUNTER — Emergency Department (HOSPITAL_BASED_OUTPATIENT_CLINIC_OR_DEPARTMENT_OTHER)
Admission: EM | Admit: 2017-06-16 | Discharge: 2017-06-16 | Disposition: A | Discharge status: Home / Self Care | Payer: Self-pay | Attending: Emergency Medicine | Admitting: Emergency Medicine

## 2017-06-16 DIAGNOSIS — F17210 Nicotine dependence, cigarettes, uncomplicated: Secondary | ICD-10-CM | POA: Insufficient documentation

## 2017-06-16 DIAGNOSIS — K0889 Other specified disorders of teeth and supporting structures: Secondary | ICD-10-CM | POA: Insufficient documentation

## 2017-06-16 DIAGNOSIS — Z79899 Other long term (current) drug therapy: Secondary | ICD-10-CM | POA: Insufficient documentation

## 2017-06-16 MED ORDER — MAGIC MOUTHWASH: 5.0000 mL | Freq: Three times a day (TID) | ORAL | 0 refills | Status: DC

## 2017-06-16 MED ORDER — IBUPROFEN 800 MG PO TABS: 800.0000 mg | ORAL_TABLET | Freq: Three times a day (TID) | ORAL | 0 refills | Status: DC

## 2017-06-16 MED ORDER — AMOXICILLIN-POT CLAVULANATE 875-125 MG PO TABS: 1.0000 | ORAL_TABLET | Freq: Two times a day (BID) | ORAL | 0 refills | Status: AC

## 2017-06-16 MED FILL — AMOX-CLAV 875-125 MG TABLET: 875-125 | 7 days supply | Qty: 14 | Fill #0

## 2017-06-16 NOTE — ED Provider Notes (Signed)
[facility] EMERGENCY DEPARTMENT Provider Note   CSN: [id number] Arrival date & time: 06/16/17  1342     History   Chief Complaint Chief Complaint  Patient presents with  . Dental Pain    HPI Patricia Wilcox is a 36 y.o. female with no pertinent past medical history presenting with progressive onset worsening right upper dental pain over the last 3 days.  States that the pain radiates up into her cheek and temple. She has a chipped tooth for years in that area which has not caused her any problems until now. She has tried Motrin 400 which temporarily relieve the symptoms. She denies any fever, chills, nausea, vomiting or other symptoms.  HPI  Past Medical History:  Diagnosis Date  . Anemia   . Anxiety   . Anxiety   . Depression   . GERD (gastroesophageal reflux disease)   . High cholesterol   . Insomnia   . Palpitation   . PTSD (post-traumatic stress disorder)     Patient Active Problem List   Diagnosis Date Noted  . Chest pain 04/23/2013  . Anemia   . High cholesterol   . Anxiety   . Depression   . Insomnia   . Palpitation   . GERD (gastroesophageal reflux disease)     Past Surgical History:  Procedure Laterality Date  . BREAST REDUCTION SURGERY    . CHOLECYSTECTOMY    . gallstone removal    . WISDOM TOOTH EXTRACTION      OB History    No data available       Home Medications    Prior to Admission medications   Medication Sig Start Date End Date Taking? Authorizing Provider  acetaminophen (TYLENOL) 500 MG tablet Take 1 tablet (500 mg total) by mouth every 6 (six) hours as needed. 08/09/16   Everlene Farrier, PA-C  amoxicillin-clavulanate (AUGMENTIN) 875-125 MG tablet Take 1 tablet by mouth 2 (two) times daily for 7 days. 06/16/17 06/23/17  Georgiana Shore, PA-C  cetirizine (ZYRTEC ALLERGY) 10 MG tablet Take 1 tablet (10 mg total) by mouth daily. 08/09/16   Everlene Farrier, PA-C  fluticasone (FLONASE) 50 MCG/ACT nasal spray Place 2  sprays into both nostrils daily. 08/09/16   Everlene Farrier, PA-C  ibuprofen (ADVIL,MOTRIN) 800 MG tablet Take 1 tablet (800 mg total) by mouth 3 (three) times daily. 06/16/17   Mathews Robinsons B, PA-C  lidocaine (LIDODERM) 5 % Place 1 patch onto the skin daily. Remove & Discard patch within 12 hours or as directed by MD 09/13/16   Harolyn Rutherford C, PA-C  magic mouthwash SOLN Take 5 mLs by mouth 3 (three) times daily. 06/16/17   Georgiana Shore, PA-C  meloxicam (MOBIC) 15 MG tablet Take 1 tablet (15 mg total) by mouth daily. Take 1 daily with food. 08/13/16   Arthor Captain, PA-C  methocarbamol (ROBAXIN) 500 MG tablet Take 1 tablet (500 mg total) by mouth 2 (two) times daily. 09/13/16   Joy, Shawn C, PA-C  omeprazole (PRILOSEC) 20 MG capsule Take 1 capsule (20 mg total) by mouth daily. 08/09/16   Everlene Farrier, PA-C    Family History History reviewed. No pertinent family history.  Social History Social History   Tobacco Use  . Smoking status: Current Some Day Smoker    Packs/day: 0.50    Types: Cigarettes  . Smokeless tobacco: Never Used  Substance Use Topics  . Alcohol use: No  . Drug use: No     Allergies  Aspirin and Contrast media [iodinated diagnostic agents]   Review of Systems Review of Systems  Constitutional: Negative for chills and fever.  HENT: Positive for dental problem. Negative for drooling, facial swelling, sinus pain, sore throat, tinnitus, trouble swallowing and voice change.   Gastrointestinal: Negative for nausea and vomiting.  Musculoskeletal: Negative for myalgias, neck pain and neck stiffness.  Skin: Negative for color change, pallor and rash.     Physical Exam Updated Vital Signs BP 123/78 (BP Location: Left Arm)   Pulse (!) 58   Temp 98.7 F (37.1 C) (Oral)   Resp 18   Ht 5\' 5"  (1.651 m)   Wt 68 kg (150 lb)   LMP 06/16/2017   SpO2 100%   BMI 24.96 kg/m   Physical Exam  Constitutional: She appears well-developed and well-nourished. No  distress.  Afebrile, nontoxic-appearing, sitting comfortably in bed no acute distress.  HENT:  Head: Normocephalic and atraumatic.  Right Ear: External ear normal.  Left Ear: External ear normal.  Mouth/Throat: Oropharynx is clear and moist. No oropharyngeal exudate.  No gross oral abscess. Uvula is midline, arches are simmetrical and intact. No peritonsilar swelling or exudate. No trismus. Sublingual mucosa is soft and non-tender. Tolerating oral secretions. No concern for ludwig's angina.  Eyes: Conjunctivae and EOM are normal. Right eye exhibits no discharge. Left eye exhibits no discharge.  Neck: Normal range of motion. Neck supple.  Cardiovascular: Normal rate, regular rhythm and normal heart sounds.  No murmur heard. Pulmonary/Chest: Effort normal and breath sounds normal. No stridor. No respiratory distress. She has no wheezes. She has no rales.  Musculoskeletal: She exhibits no edema.  Lymphadenopathy:    She has cervical adenopathy.  Neurological: She is alert.  Skin: Skin is warm and dry. No rash noted. She is not diaphoretic. No erythema. No pallor.  Psychiatric: She has a normal mood and affect.  Nursing note and vitals reviewed.    ED Treatments / Results  Labs (all labs ordered are listed, but only abnormal results are displayed) Labs Reviewed - No data to display  EKG  EKG Interpretation None       Radiology No results found.  Procedures Procedures (including critical care time)  Medications Ordered in ED Medications - No data to display   Initial Impression / Assessment and Plan / ED Course  I have reviewed the triage vital signs and the nursing notes.  Pertinent labs & imaging results that were available during my care of the patient were reviewed by me and considered in my medical decision making (see chart for details).    Patient with toothache.  No gross abscess.  Exam unconcerning for Ludwig's angina or spread of infection.  Will treat with  penicillin and pain medicine.  Urged patient to follow-up with dentist.    Discussed strict return precautions and advised to return to the emergency department if experiencing any new or worsening symptoms. Instructions were understood and patient agreed with discharge plan.  Final Clinical Impressions(s) / ED Diagnoses   Final diagnoses:  Pain, dental    ED Discharge Orders        Ordered    magic mouthwash SOLN  3 times daily     06/16/17 1520    amoxicillin-clavulanate (AUGMENTIN) 875-125 MG tablet  2 times daily     06/16/17 1520    ibuprofen (ADVIL,MOTRIN) 800 MG tablet  3 times daily     06/16/17 1520       Georgiana ShoreMitchell, Konner Saiz B, [place]PA-C 06/16/17  1540    Lavera GuiseLiu, Dana Duo, MD 06/17/17 [id number]

## 2017-06-16 NOTE — Discharge Instructions (Signed)
As discussed, please take your entire course of antibiotics even if you feel better.  Ibuprofen 3 times a day as needed for pain. Follow-up with your dentist in the next week.  Make sure that you stay well-hydrated.  Return sooner if symptoms worsen, facial swelling, difficulty swallowing, difficulty breathing, fever, chills, nausea, vomiting or any other new concerning symptoms in the meantime.

## 2017-06-16 NOTE — ED Triage Notes (Signed)
Patient states that she has right upper mouth pain x 3 days .

## 2017-06-16 NOTE — ED Notes (Signed)
ED Provider at bedside. 

## 2017-06-16 NOTE — ED Notes (Addendum)
Patient asked the MD while she was in the room for Charge Nurse - This Rn responded to her. The patient reports that she was put in the room and was not given a call bell and she was in pain and no way to call for help. Patient is currently standing up in the hallway and asking everyone that walks by to contact the RN - the patients complaint was addressed. Patient wants Someone to look in her chart and given her an RX for " my antibiotic and pain medication" and then be d/c'd - Explained that the MD was working on this. Was attempting to address why the patient needed a call bell ( what her request was going to be) and she states " nothing you just put me in without a call bell"  - Patient refuses to sit on the bed while awaiting further care.

## 2017-08-19 ENCOUNTER — Other Ambulatory Visit: Payer: Self-pay

## 2017-08-19 ENCOUNTER — Emergency Department (HOSPITAL_BASED_OUTPATIENT_CLINIC_OR_DEPARTMENT_OTHER)
Admission: EM | Admit: 2017-08-19 | Discharge: 2017-08-20 | Disposition: A | Discharge status: Home / Self Care | Payer: Self-pay | Attending: Emergency Medicine | Admitting: Emergency Medicine

## 2017-08-19 ENCOUNTER — Encounter (HOSPITAL_BASED_OUTPATIENT_CLINIC_OR_DEPARTMENT_OTHER): Payer: Self-pay | Admitting: *Deleted

## 2017-08-19 DIAGNOSIS — F17210 Nicotine dependence, cigarettes, uncomplicated: Secondary | ICD-10-CM | POA: Insufficient documentation

## 2017-08-19 DIAGNOSIS — K029 Dental caries, unspecified: Secondary | ICD-10-CM | POA: Insufficient documentation

## 2017-08-19 NOTE — ED Triage Notes (Signed)
Dental pain x 2 days

## 2017-08-20 MED ORDER — HYDROCODONE-ACETAMINOPHEN 5-325 MG PO TABS: 1.0000 | ORAL_TABLET | ORAL | 0 refills | Status: DC | PRN

## 2017-08-20 MED ORDER — NAPROXEN 250 MG PO TABS
500.0000 mg | ORAL_TABLET | Freq: Once | ORAL | Status: AC
  Administered 2017-08-20: 500 mg via ORAL
  Filled 2017-08-20: qty 2

## 2017-08-20 MED ORDER — BUPIVACAINE-EPINEPHRINE (PF) 0.5% -1:200000 IJ SOLN
1.8000 mL | Freq: Once | INTRAMUSCULAR | Status: DC
  Filled 2017-08-20: qty 1.8

## 2017-08-20 MED ORDER — AMOXICILLIN 500 MG PO CAPS: 500.0000 mg | ORAL_CAPSULE | Freq: Three times a day (TID) | ORAL | 0 refills | Status: DC

## 2017-08-20 MED ORDER — AMOXICILLIN 500 MG PO CAPS
1000.0000 mg | ORAL_CAPSULE | Freq: Once | ORAL | Status: AC
  Administered 2017-08-20: 1000 mg via ORAL
  Filled 2017-08-20: qty 2

## 2017-08-20 NOTE — ED Provider Notes (Signed)
[facility] Provider Note: Lowella Dell, MD, FACEP  CSN: [id number] MRN: [id number] ARRIVAL: 08/19/17 at 2144 ROOM: MH09/[facility]09   CHIEF COMPLAINT  Dental Pain   HISTORY OF PRESENT ILLNESS  08/20/17 1:07 AM Patricia Wilcox is a 37 y.o. female with a 2-day history of pain in her right upper second molar.  She rates her pain as a 6 out of 10, worse with eating or drinking.  She has had pain in that tooth before. There is no associated fever or lymphadenopathy.  Consultation with the [facility] state controlled substances database reveals the patient has received 1 prescription for hydrocodone in the past year.   Past Medical History:  Diagnosis Date  . Anemia   . Anxiety   . Anxiety   . Depression   . GERD (gastroesophageal reflux disease)   . High cholesterol   . Insomnia   . Palpitation   . PTSD (post-traumatic stress disorder)     Past Surgical History:  Procedure Laterality Date  . BREAST REDUCTION SURGERY    . CHOLECYSTECTOMY    . gallstone removal    . WISDOM TOOTH EXTRACTION      No family history on file.  Social History   Tobacco Use  . Smoking status: Current Some Day Smoker    Packs/day: 0.50    Types: Cigarettes  . Smokeless tobacco: Never Used  Substance Use Topics  . Alcohol use: No  . Drug use: No    Prior to Admission medications   Medication Sig Start Date End Date Taking? Authorizing Provider  amoxicillin (AMOXIL) 500 MG capsule Take 1 capsule (500 mg total) by mouth 3 (three) times daily. 08/20/17   Nneka Blanda, MD  HYDROcodone-acetaminophen (NORCO) 5-325 MG tablet Take 1 tablet by mouth every 4 (four) hours as needed for severe pain. 08/20/17   Ceceilia Cephus, MD    Allergies Aspirin and Contrast media [iodinated diagnostic agents]   REVIEW OF SYSTEMS  Negative except as noted here or in the History of Present Illness.   PHYSICAL EXAMINATION  Initial Vital Signs Blood pressure 114/76, pulse 80, temperature 98.4 F  (36.9 C), temperature source Oral, resp. rate 18, height 5\' 5"  (1.651 m), weight 65.8 kg (145 lb), last menstrual period 08/12/2017.  Examination General: Well-developed, well-nourished female in no acute distress; appearance consistent with age of record HENT: normocephalic; atraumatic; carious right upper second molar with tenderness to percussion Eyes: pupils equal, round and reactive to light; extraocular muscles intact Neck: supple; no lymphadenopathy Heart: regular rate and rhythm Lungs: clear to auscultation bilaterally Abdomen: soft; nondistended present Extremities: No deformity; full range of motion; pulses normal Neurologic: Awake, alert and oriented; motor function intact in all extremities and symmetric; no facial droop Skin: Warm and dry Psychiatric: Normal mood and affect   RESULTS  Summary of this visit's results, reviewed by myself:   EKG Interpretation  Date/Time:    Ventricular Rate:    PR Interval:    QRS Duration:   QT Interval:    QTC Calculation:   R Axis:     Text Interpretation:        Laboratory Studies: No results found for this or any previous visit (from the past 24 hour(s)). Imaging Studies: No results found.  ED COURSE  Nursing notes and initial vitals signs, including pulse oximetry, reviewed.  Vitals:   08/19/17 2155 08/19/17 2159  BP: 114/76   Pulse: 80   Resp: 18   Temp: 98.4 F (36.9 C)  TempSrc: Oral   Weight:  65.8 kg (145 lb)  Height:  5\' 5"  (1.651 m)   Patient refused dental block.  We will treat with oral antibiotic and analgesia.  Will refer to dentist on call.  PROCEDURES    ED DIAGNOSES     ICD-10-CM   1. Pain due to dental caries K02.9        Johnatha Zeidman, MD 08/20/17 [id number]

## 2017-08-20 NOTE — ED Notes (Signed)
Pt refused bupivacaine injection

## 2017-08-20 NOTE — ED Notes (Signed)
C/o rt upper back tooth pain   Hx of same

## 2017-09-24 ENCOUNTER — Encounter (HOSPITAL_BASED_OUTPATIENT_CLINIC_OR_DEPARTMENT_OTHER): Payer: Self-pay

## 2017-09-24 ENCOUNTER — Emergency Department (HOSPITAL_BASED_OUTPATIENT_CLINIC_OR_DEPARTMENT_OTHER)
Admission: EM | Admit: 2017-09-24 | Discharge: 2017-09-24 | Disposition: A | Discharge status: Home / Self Care | Payer: Self-pay | Attending: Emergency Medicine | Admitting: Emergency Medicine

## 2017-09-24 ENCOUNTER — Other Ambulatory Visit: Payer: Self-pay

## 2017-09-24 DIAGNOSIS — F17210 Nicotine dependence, cigarettes, uncomplicated: Secondary | ICD-10-CM | POA: Insufficient documentation

## 2017-09-24 DIAGNOSIS — K0889 Other specified disorders of teeth and supporting structures: Secondary | ICD-10-CM | POA: Insufficient documentation

## 2017-09-24 MED ORDER — LIDOCAINE VISCOUS 2 % MT SOLN: 15.0000 mL | OROMUCOSAL | 0 refills | Status: DC | PRN

## 2017-09-24 MED ORDER — PENICILLIN V POTASSIUM 500 MG PO TABS: 500.0000 mg | ORAL_TABLET | Freq: Four times a day (QID) | ORAL | 0 refills | Status: AC

## 2017-09-24 NOTE — ED Provider Notes (Signed)
[facility] EMERGENCY DEPARTMENT Provider Note   CSN: [id number] Arrival date & time: 09/24/17  1818     History   Chief Complaint Chief Complaint  Patient presents with  . Dental Pain    HPI Patricia Wilcox is a 37 y.o. female presenting with right upper dental pain times 3 days.  Pain is constant, characterized as throbbing, nonradiating, and worsening since onset.  No fevers, chills, trismus, muffled voice, trouble swallowing, or sore throat.  She does not have a dentist, but just got a new job and is hoping to get established with a dentist soon.  No allergies to antibiotics.  The history is provided by the patient. No language interpreter was used.  Dental Pain   This is a recurrent problem. The current episode started more than 2 days ago. The problem occurs constantly. The problem has been gradually worsening. The pain is at a severity of 8/10. The pain is moderate. She has tried acetaminophen for the symptoms. The treatment provided mild relief.    Past Medical History:  Diagnosis Date  . Anemia   . Anxiety   . Anxiety   . Depression   . GERD (gastroesophageal reflux disease)   . Insomnia   . Palpitation   . PTSD (post-traumatic stress disorder)     Patient Active Problem List   Diagnosis Date Noted  . Chest pain 04/23/2013  . Anemia   . High cholesterol   . Anxiety   . Depression   . Insomnia   . Palpitation   . GERD (gastroesophageal reflux disease)     Past Surgical History:  Procedure Laterality Date  . BREAST REDUCTION SURGERY    . CHOLECYSTECTOMY    . gallstone removal    . WISDOM TOOTH EXTRACTION       OB History   None      Home Medications    Prior to Admission medications   Medication Sig Start Date End Date Taking? Authorizing Provider  amoxicillin (AMOXIL) 500 MG capsule Take 1 capsule (500 mg total) by mouth 3 (three) times daily. 08/20/17   Molpus, John, MD  HYDROcodone-acetaminophen (NORCO) 5-325 MG tablet Take 1 tablet  by mouth every 4 (four) hours as needed for severe pain. 08/20/17   Molpus, John, MD  lidocaine (XYLOCAINE) 2 % solution Use as directed 15 mLs in the mouth or throat as needed for mouth pain. 09/24/17   Kyley Laurel A, PA-C  penicillin v potassium (VEETID) 500 MG tablet Take 1 tablet (500 mg total) by mouth 4 (four) times daily for 7 days. 09/24/17 10/01/17  Jacyln Carmer, Pedro EarlsMia A, PA-C    Family History No family history on file.  Social History Social History   Tobacco Use  . Smoking status: Current Some Day Smoker    Packs/day: 0.50    Types: Cigarettes  . Smokeless tobacco: Never Used  Substance Use Topics  . Alcohol use: No  . Drug use: No     Allergies   Aspirin and Contrast media [iodinated diagnostic agents]   Review of Systems Review of Systems  Constitutional: Negative for chills and fever.  HENT: Positive for dental problem. Negative for sore throat, tinnitus, trouble swallowing and voice change.   Respiratory: Negative for cough and shortness of breath.   Allergic/Immunologic: Negative for immunocompromised state.   Physical Exam Updated Vital Signs BP (!) 136/93 (BP Location: Left Arm)   Pulse 77   Temp 99 F (37.2 C) (Oral)   Resp 18  Ht 5\' 3"  (1.6 m)   Wt 63.5 kg (140 lb)   LMP 09/10/2017   SpO2 100%   BMI 24.80 kg/m   Physical Exam  Constitutional: No distress.  HENT:  Head: Normocephalic.  Mouth/Throat: Uvula is midline, oropharynx is clear and moist and mucous membranes are normal. No trismus in the jaw. Dental caries present. No dental abscesses or uvula swelling. No tonsillar exudate.    Tooth #3 is fractured. No surrounding erythema or edema. Dentin is exposed.  No gross abscess.  No sublingual edema or induration.  Posterior oropharynx is patent.  Eyes: Conjunctivae are normal.  Neck: Neck supple.  Cardiovascular: Normal rate and regular rhythm. Exam reveals no gallop and no friction rub.  No murmur heard. Pulmonary/Chest: Effort normal. No  respiratory distress.  Abdominal: Soft. She exhibits no distension.  Neurological: She is alert.  Skin: Skin is warm. No rash noted.  Psychiatric: Her behavior is normal.  Nursing note and vitals reviewed.  ED Treatments / Results  Labs (all labs ordered are listed, but only abnormal results are displayed) Labs Reviewed - No data to display  EKG None  Radiology No results found.  Procedures Procedures (including critical care time)  Medications Ordered in ED Medications - No data to display   Initial Impression / Assessment and Plan / ED Course  I have reviewed the triage vital signs and the nursing notes.  Pertinent labs & imaging results that were available during my care of the patient were reviewed by me and considered in my medical decision making (see chart for details).    Patient with toothache.  No gross abscess.  Exam unconcerning for Ludwig's angina or spread of infection.  Will treat with penicillin and anti-inflammatory medicine.  Urged patient to follow-up with dentist.  Dental resource guide provided.   Final Clinical Impressions(s) / ED Diagnoses   Final diagnoses:  Pain, dental    ED Discharge Orders        Ordered    penicillin v potassium (VEETID) 500 MG tablet  4 times daily     09/24/17 1911    lidocaine (XYLOCAINE) 2 % solution  As needed     09/24/17 1916       Jari Carollo, Coral Else, PA-C 09/24/17 1930    Melene Plan, DO 09/24/17 2112

## 2017-09-24 NOTE — ED Notes (Signed)
ED Provider at bedside. 

## 2017-09-24 NOTE — Discharge Instructions (Signed)
Take one tablet of penicillin every 6 hours for the next 7 days.   Please use the attached resource guide to find a dentist in the area for follow-up.  You can control your pain by gargling warm salt water.  Use 15 mL of viscous lidocaine every 3 hours to help numb the area.  Please do not use this medication more than prescribed due to side effects.  Take 650 mg of Tylenol or 600 mg of ibuprofen with food every 6 hours for pain control.  Sticking to room temperature food and beverages will also help with your pain as cold and hot beverages and food can make your symptoms worse.  If you develop new or worsening symptoms including significant swelling to the face or neck, the inability to open your mouth, drooling, muffled voice, fever despite taking Tylenol, or other new concerning symptoms, please return to the emergency department for re-evaluation.

## 2017-09-24 NOTE — ED Triage Notes (Signed)
Pt c/o chronic dental pain that started again three days ago, upper right side in the back, has not had anything for pain, does not have a dentist

## 2018-02-10 ENCOUNTER — Emergency Department (HOSPITAL_BASED_OUTPATIENT_CLINIC_OR_DEPARTMENT_OTHER)
Admission: EM | Admit: 2018-02-10 | Discharge: 2018-02-10 | Disposition: A | Discharge status: Home / Self Care | Payer: Self-pay | Attending: Emergency Medicine | Admitting: Emergency Medicine

## 2018-02-10 ENCOUNTER — Encounter (HOSPITAL_BASED_OUTPATIENT_CLINIC_OR_DEPARTMENT_OTHER): Payer: Self-pay | Admitting: *Deleted

## 2018-02-10 ENCOUNTER — Other Ambulatory Visit: Payer: Self-pay

## 2018-02-10 DIAGNOSIS — N939 Abnormal uterine and vaginal bleeding, unspecified: Secondary | ICD-10-CM | POA: Insufficient documentation

## 2018-02-10 DIAGNOSIS — F17210 Nicotine dependence, cigarettes, uncomplicated: Secondary | ICD-10-CM | POA: Insufficient documentation

## 2018-02-10 DIAGNOSIS — R102 Pelvic and perineal pain: Secondary | ICD-10-CM | POA: Insufficient documentation

## 2018-02-10 LAB — CBC WITH DIFFERENTIAL/PLATELET
Basophils Absolute: 0 10*3/uL (ref 0.0–0.1)
Basophils Relative: 0 %
Eosinophils Absolute: 0.1 10*3/uL (ref 0.0–0.7)
Eosinophils Relative: 2 %
HCT: 37.9 % (ref 36.0–46.0)
Hemoglobin: 12.3 g/dL (ref 12.0–15.0)
Lymphocytes Relative: 35 %
Lymphs Abs: 1.7 10*3/uL (ref 0.7–4.0)
MCH: 24.3 pg — ABNORMAL LOW (ref 26.0–34.0)
MCHC: 32.5 g/dL (ref 30.0–36.0)
MCV: 74.9 fL — ABNORMAL LOW (ref 78.0–100.0)
Monocytes Absolute: 0.4 10*3/uL (ref 0.1–1.0)
Monocytes Relative: 8 %
Neutro Abs: 2.6 10*3/uL (ref 1.7–7.7)
Neutrophils Relative %: 55 %
Platelets: 244 10*3/uL (ref 150–400)
RBC: 5.06 MIL/uL (ref 3.87–5.11)
RDW: 15.5 % (ref 11.5–15.5)
WBC: 4.7 10*3/uL (ref 4.0–10.5)

## 2018-02-10 LAB — URINALYSIS, ROUTINE W REFLEX MICROSCOPIC
Bilirubin Urine: NEGATIVE
GLUCOSE, UA: NEGATIVE mg/dL
Hgb urine dipstick: NEGATIVE
KETONES UR: NEGATIVE mg/dL
LEUKOCYTES UA: NEGATIVE
NITRITE: NEGATIVE
PH: 7.5 (ref 5.0–8.0)
Protein, ur: NEGATIVE mg/dL
Specific Gravity, Urine: 1.015 (ref 1.005–1.030)

## 2018-02-10 LAB — WET PREP, GENITAL
Clue Cells Wet Prep HPF POC: NONE SEEN
Sperm: NONE SEEN
Trich, Wet Prep: NONE SEEN
Yeast Wet Prep HPF POC: NONE SEEN

## 2018-02-10 LAB — PREGNANCY, URINE: Preg Test, Ur: NEGATIVE

## 2018-02-10 NOTE — ED Provider Notes (Signed)
[facility] EMERGENCY DEPARTMENT Provider Note   CSN: [id number] Arrival date & time: 02/10/18  1341     History   Chief Complaint Chief Complaint  Patient presents with  . Vaginal Bleeding    HPI Patricia Wilcox is a 37 y.o. female with history of anemia, anxiety, depression, GERD presents today for evaluation of acute onset, persistent vaginal bleeding for the past 8 days.  She states that she had was a normal period for her approximately 3-1/2 weeks ago.  She states this lasted a proximally 4 days which is normal for her and then resolved.  She states that she then had recurrence of vaginal bleeding 8 days ago.  She states that she had heavy vaginal bleeding for 3 days and since then has had very light spotting.  She states the blood is currently rust colored and very minimal.  She will wear a tampon overnight and states she is been changing pads during the day very occasionally.  She states that she has a history of irregular menstrual periods but it is abnormal for her periods to last this long.  She does note mild pelvic cramping which radiates to her back typical of her usual menstrual periods.  No aggravating or alleviating factors noted.  Has been taking Aleve and Motrin with improvement in her symptoms.  She denies fever, chills, nausea, vomiting, melena, hematochezia, vaginal pain, vaginal trauma, or vaginal discharge.  She is currently sexually active with one female partner.  She declines STD testing today. States she has a history of uterine fibroids.   The history is provided by the patient.    Past Medical History:  Diagnosis Date  . Anemia   . Anxiety   . Anxiety   . Depression   . GERD (gastroesophageal reflux disease)   . Insomnia   . Palpitation   . PTSD (post-traumatic stress disorder)     Patient Active Problem List   Diagnosis Date Noted  . Chest pain 04/23/2013  . Anemia   . High cholesterol   . Anxiety   . Depression   . Insomnia   .  Palpitation   . GERD (gastroesophageal reflux disease)     Past Surgical History:  Procedure Laterality Date  . BREAST REDUCTION SURGERY    . CHOLECYSTECTOMY    . gallstone removal    . WISDOM TOOTH EXTRACTION       OB History   None      Home Medications    Prior to Admission medications   Not on File    Family History History reviewed. No pertinent family history.  Social History Social History   Tobacco Use  . Smoking status: Current Some Day Smoker    Packs/day: 0.50    Types: Cigarettes  . Smokeless tobacco: Never Used  Substance Use Topics  . Alcohol use: No  . Drug use: No     Allergies   Aspirin and Contrast media [iodinated diagnostic agents]   Review of Systems Review of Systems  Constitutional: Negative for chills and fever.  Respiratory: Negative for shortness of breath.   Cardiovascular: Negative for chest pain.  Gastrointestinal: Negative for nausea and vomiting.  Genitourinary: Positive for pelvic pain and vaginal bleeding. Negative for vaginal discharge and vaginal pain.  Neurological: Negative for light-headedness.  All other systems reviewed and are negative.    Physical Exam Updated Vital Signs BP 119/83 (BP Location: Right Arm)   Pulse 60   Temp 98 F (36.7 C) (  Oral)   Resp 16   Ht 5\' 2"  (1.575 m)   Wt 61.2 kg   LMP 02/02/2018   SpO2 100%   BMI 24.69 kg/m   Physical Exam  Constitutional: She appears well-developed and well-nourished. No distress.  Resting comfortably in bed  HENT:  Head: Normocephalic and atraumatic.  Eyes: Conjunctivae are normal. Right eye exhibits no discharge. Left eye exhibits no discharge.  Neck: No JVD present. No tracheal deviation present.  Cardiovascular: Normal rate, regular rhythm, normal heart sounds and intact distal pulses.  Pulmonary/Chest: Effort normal and breath sounds normal.  Abdominal: Soft. Bowel sounds are normal. She exhibits no distension and no mass. There is no tenderness.  There is no guarding.  No CVA tenderness  Genitourinary:  Genitourinary Comments: Examination performed in the presence of a chaperone.  No masses or lesions to the external genitalia.  Scant blood noted in the vaginal vault.  No cervical motion tenderness or adnexal tenderness.  Uterus is soft and nontender.  Cervix is not friable in appearance.  Musculoskeletal: Normal range of motion. She exhibits no edema or tenderness.  No midline spine TTP, no paraspinal muscle tenderness, no deformity, crepitus, or step-off noted   Neurological: She is alert.  Skin: Skin is warm and dry. No erythema.  Psychiatric: She has a normal mood and affect. Her behavior is normal.  Nursing note and vitals reviewed.    ED Treatments / Results  Labs (all labs ordered are listed, but only abnormal results are displayed) Labs Reviewed  WET PREP, GENITAL - Abnormal; Notable for the following components:      Result Value   WBC, Wet Prep HPF POC MODERATE (*)    All other components within normal limits  CBC WITH DIFFERENTIAL/PLATELET - Abnormal; Notable for the following components:   MCV 74.9 (*)    MCH 24.3 (*)    All other components within normal limits  URINALYSIS, ROUTINE W REFLEX MICROSCOPIC  PREGNANCY, URINE    EKG None  Radiology No results found.  Procedures Procedures (including critical care time)  Medications Ordered in ED Medications - No data to display   Initial Impression / Assessment and Plan / ED Course  I have reviewed the triage vital signs and the nursing notes.  Pertinent labs & imaging results that were available during my care of the patient were reviewed by me and considered in my medical decision making (see chart for details).    Patient with vaginal bleeding for 8 days.  States this is 2 weeks after what she expected to be her "normal period ".  She is afebrile, vital signs are stable.  She is nontoxic in appearance.  No abdominal tenderness on examination though  she endorses mild pelvic cramping consistent with her usual periods.  Doubt obstruction, perforation, appendicitis, colitis, ectopic pregnancy, PID, TOA, ovarian torsion, or other acute surgical abdominal pathology.  She does have a history of uterine fibroids which could be contributing to her symptoms.  Lab work reviewed by me shows stable H&H, anemia has actually improved since 1 year ago.  Wet prep shows moderate W BCs but otherwise no evidence of BV, yeast vaginitis.  UA without acute abnormality, no evidence of nephrolithiasis or UTI.  Patient is not pregnant.  Recommend follow-up with PCP or OB/GYN for reevaluation, she may benefit from outpatient pelvic ultrasound.  She may also benefit from initiation of OCPs for discussion of surgical options for her dysfunction uterine bleeding.  Discussed strict ED return precautions.  Patient and patient's partner verbalized understanding of and agreement with plan and patient is stable for discharge home at this time.   Final Clinical Impressions(s) / ED Diagnoses   Final diagnoses:  Abnormal uterine bleeding (AUB)    ED Discharge Orders    None       Jeanie Sewer, PA-C 02/10/18 2042    Melene Plan, DO 02/10/18 2326

## 2018-02-10 NOTE — Discharge Instructions (Signed)
Drink plenty of fluids and get plenty of rest.  Take ibuprofen or Tylenol as needed for cramping.  Follow-up with your primary care physician or an OB/GYN for reevaluation of your symptoms.  He may require a pelvic ultrasound.  It may be beneficial to get started on birth control pills for your vaginal bleeding.  Return to the emergency department if any concerning signs or symptoms develop such as worsening bleeding, abdominal pains, persistent vomiting, fevers, lightheadedness, or passing out.

## 2018-02-10 NOTE — ED Triage Notes (Signed)
Pt c/o vaginal bleeding x 2 episodes this month

## 2018-02-10 NOTE — ED Notes (Signed)
Pt/family verbalized understanding of discharge instructions.   

## 2018-02-20 ENCOUNTER — Encounter (HOSPITAL_BASED_OUTPATIENT_CLINIC_OR_DEPARTMENT_OTHER): Payer: Self-pay | Admitting: Emergency Medicine

## 2018-02-20 ENCOUNTER — Emergency Department (HOSPITAL_BASED_OUTPATIENT_CLINIC_OR_DEPARTMENT_OTHER)
Admission: EM | Admit: 2018-02-20 | Discharge: 2018-02-20 | Disposition: A | Discharge status: Home / Self Care | Payer: Self-pay | Attending: Emergency Medicine | Admitting: Emergency Medicine

## 2018-02-20 ENCOUNTER — Other Ambulatory Visit: Payer: Self-pay

## 2018-02-20 DIAGNOSIS — F17210 Nicotine dependence, cigarettes, uncomplicated: Secondary | ICD-10-CM | POA: Insufficient documentation

## 2018-02-20 DIAGNOSIS — N898 Other specified noninflammatory disorders of vagina: Secondary | ICD-10-CM | POA: Insufficient documentation

## 2018-02-20 LAB — URINALYSIS, ROUTINE W REFLEX MICROSCOPIC
Bilirubin Urine: NEGATIVE
GLUCOSE, UA: NEGATIVE mg/dL
HGB URINE DIPSTICK: NEGATIVE
Ketones, ur: NEGATIVE mg/dL
LEUKOCYTES UA: NEGATIVE
Nitrite: NEGATIVE
PH: 8.5 — AB (ref 5.0–8.0)
Protein, ur: NEGATIVE mg/dL
Specific Gravity, Urine: 1.01 (ref 1.005–1.030)

## 2018-02-20 LAB — WET PREP, GENITAL
Clue Cells Wet Prep HPF POC: NONE SEEN
SPERM: NONE SEEN
TRICH WET PREP: NONE SEEN
Yeast Wet Prep HPF POC: NONE SEEN

## 2018-02-20 LAB — PREGNANCY, URINE: Preg Test, Ur: NEGATIVE

## 2018-02-20 MED ORDER — FLUCONAZOLE 150 MG PO TABS: 150.0000 mg | ORAL_TABLET | Freq: Every day | ORAL | 0 refills | Status: DC

## 2018-02-20 MED ORDER — METRONIDAZOLE 500 MG PO TABS: 500.0000 mg | ORAL_TABLET | Freq: Two times a day (BID) | ORAL | 0 refills | Status: DC

## 2018-02-20 NOTE — ED Triage Notes (Signed)
Pt reports green vaginal discharge today

## 2018-02-20 NOTE — Discharge Instructions (Addendum)
Diflucan and Flagyl as prescribed.  Follow-up with your primary doctor if not improving in the next few days.  We will call you if your cultures indicate you require further treatment or action.

## 2018-02-20 NOTE — ED Provider Notes (Signed)
[facility] EMERGENCY DEPARTMENT Provider Note   CSN: [id number] Arrival date & time: 02/20/18  0106     History   Chief Complaint Chief Complaint  Patient presents with  . Vaginal Discharge    HPI Patricia Wilcox is a 37 y.o. female.  Patient is a 37 year old female with past medical history of anxiety and depression.  She presents today for evaluation of vaginal discharge.  This started earlier today.  She reports that it is green in color and not associated with any fevers, abdominal pain, chills.  She denies any recent antibiotic use.  She does state that she is prone to both yeast and BV.  The history is provided by the patient.  Vaginal Discharge   This is a new problem. The current episode started yesterday. The problem occurs constantly. The problem has been rapidly worsening. Vaginal discharge characteristics: green.    Past Medical History:  Diagnosis Date  . Anemia   . Anxiety   . Anxiety   . Depression   . GERD (gastroesophageal reflux disease)   . Insomnia   . Palpitation   . PTSD (post-traumatic stress disorder)     Patient Active Problem List   Diagnosis Date Noted  . Chest pain 04/23/2013  . Anemia   . High cholesterol   . Anxiety   . Depression   . Insomnia   . Palpitation   . GERD (gastroesophageal reflux disease)     Past Surgical History:  Procedure Laterality Date  . BREAST REDUCTION SURGERY    . CHOLECYSTECTOMY    . gallstone removal    . WISDOM TOOTH EXTRACTION       OB History   None      Home Medications    Prior to Admission medications   Not on File    Family History No family history on file.  Social History Social History   Tobacco Use  . Smoking status: Current Some Day Smoker    Packs/day: 0.50    Types: Cigarettes  . Smokeless tobacco: Never Used  Substance Use Topics  . Alcohol use: No  . Drug use: No     Allergies   Aspirin and Contrast media [iodinated diagnostic agents]   Review of  Systems Review of Systems  Genitourinary: Positive for vaginal discharge.  All other systems reviewed and are negative.    Physical Exam Updated Vital Signs BP 113/72 (BP Location: Left Arm)   Pulse 72   Temp 98.4 F (36.9 C) (Oral)   Resp 16   Ht 5\' 2"  (1.575 m)   Wt 61.2 kg   LMP 02/02/2018   SpO2 100%   BMI 24.68 kg/m   Physical Exam  Constitutional: She is oriented to person, place, and time. She appears well-developed and well-nourished. No distress.  HENT:  Head: Normocephalic and atraumatic.  Neck: Normal range of motion. Neck supple.  Pulmonary/Chest: Effort normal.  Genitourinary: Vagina normal and uterus normal.  Genitourinary Comments: There is a whitish-green discharge present.  Neurological: She is alert and oriented to person, place, and time.  Skin: Skin is warm and dry. She is not diaphoretic.  Nursing note and vitals reviewed.    ED Treatments / Results  Labs (all labs ordered are listed, but only abnormal results are displayed) Labs Reviewed  URINALYSIS, ROUTINE W REFLEX MICROSCOPIC - Abnormal; Notable for the following components:      Result Value   APPearance HAZY (*)    pH 8.5 (*)  All other components within normal limits  WET PREP, GENITAL  PREGNANCY, URINE  GC/CHLAMYDIA PROBE AMP ([place]) [facility]    EKG None  Radiology No results found.  Procedures Procedures (including critical care time)  Medications Ordered in ED Medications - No data to display   Initial Impression / Assessment and Plan / ED Course  I have reviewed the triage vital signs and the nursing notes.  Pertinent labs & imaging results that were available during my care of the patient were reviewed by me and considered in my medical decision making (see chart for details).  Patient presenting with complaints of vaginal discharge.  She has a long history of recurrent BV and yeast infections.  Today's wet prep appears to show neither.  The patient is  insistent that she has 1 or the other, or both.  She will be given both Flagyl and Diflucan.  She is to follow-up if not improving.  GC and Chlamydia testing are pending.  Final Clinical Impressions(s) / ED Diagnoses   Final diagnoses:  None    ED Discharge Orders    None       Geoffery Lyonselo, Taryn Shellhammer, MD 02/20/18 0206

## 2018-02-24 LAB — GC/CHLAMYDIA PROBE AMP (~~LOC~~) NOT AT ARMC
CHLAMYDIA, DNA PROBE: NEGATIVE
Neisseria Gonorrhea: NEGATIVE

## 2018-07-21 ENCOUNTER — Emergency Department (HOSPITAL_BASED_OUTPATIENT_CLINIC_OR_DEPARTMENT_OTHER)
Admission: EM | Admit: 2018-07-21 | Discharge: 2018-07-21 | Disposition: A | Discharge status: Home / Self Care | Payer: Self-pay | Attending: Emergency Medicine | Admitting: Emergency Medicine

## 2018-07-21 ENCOUNTER — Encounter (HOSPITAL_BASED_OUTPATIENT_CLINIC_OR_DEPARTMENT_OTHER): Payer: Self-pay

## 2018-07-21 ENCOUNTER — Other Ambulatory Visit: Payer: Self-pay

## 2018-07-21 DIAGNOSIS — R6889 Other general symptoms and signs: Secondary | ICD-10-CM

## 2018-07-21 DIAGNOSIS — F17210 Nicotine dependence, cigarettes, uncomplicated: Secondary | ICD-10-CM | POA: Insufficient documentation

## 2018-07-21 DIAGNOSIS — R05 Cough: Secondary | ICD-10-CM | POA: Insufficient documentation

## 2018-07-21 DIAGNOSIS — M7918 Myalgia, other site: Secondary | ICD-10-CM | POA: Insufficient documentation

## 2018-07-21 DIAGNOSIS — R509 Fever, unspecified: Secondary | ICD-10-CM | POA: Insufficient documentation

## 2018-07-21 DIAGNOSIS — J111 Influenza due to unidentified influenza virus with other respiratory manifestations: Secondary | ICD-10-CM | POA: Insufficient documentation

## 2018-07-21 LAB — URINALYSIS, ROUTINE W REFLEX MICROSCOPIC
BILIRUBIN URINE: NEGATIVE
Glucose, UA: NEGATIVE mg/dL
Ketones, ur: NEGATIVE mg/dL
Leukocytes, UA: NEGATIVE
Nitrite: NEGATIVE
Protein, ur: NEGATIVE mg/dL
Specific Gravity, Urine: 1.015 (ref 1.005–1.030)
pH: 8 (ref 5.0–8.0)

## 2018-07-21 LAB — URINALYSIS, MICROSCOPIC (REFLEX)

## 2018-07-21 LAB — PREGNANCY, URINE: Preg Test, Ur: NEGATIVE

## 2018-07-21 MED ORDER — PRENATAL COMPLETE 14-0.4 MG PO TABS: 1.0000 | ORAL_TABLET | Freq: Every day | ORAL | 1 refills | Status: DC

## 2018-07-21 NOTE — ED Notes (Signed)
PT states no chance of pregnancy. Is not sexually active. Left urine cup at bedside for when pt needs to urinate.

## 2018-07-21 NOTE — ED Provider Notes (Signed)
[facility] EMERGENCY DEPARTMENT Provider Note   CSN: [id number] Arrival date & time: 07/21/18  0006     History   Chief Complaint Chief Complaint  Patient presents with  . Fatigue    HPI Patricia Wilcox is a 38 y.o. female.  The history is provided by the patient.  Cough  Severity:  Moderate Onset quality:  Gradual Duration:  7 days Timing:  Intermittent Progression:  Worsening Chronicity:  New Smoker: yes   Relieved by:  Nothing Worsened by:  Nothing Associated symptoms: chills, fever and myalgias   Associated symptoms: no chest pain and no shortness of breath   Risk factors: no recent travel   Patient reports for up to week she had a cough, fever, fatigue, allergies.  She also did have some nausea vomiting and mild diarrhea is improved. She want to make sure she did not have pneumonia.  She reports recent bleeding due to menstrual cycle, that may be contributing to her fatigue.  Past Medical History:  Diagnosis Date  . Anemia   . Anxiety   . Anxiety   . Depression   . GERD (gastroesophageal reflux disease)   . Insomnia   . Palpitation   . PTSD (post-traumatic stress disorder)     Patient Active Problem List   Diagnosis Date Noted  . Chest pain 04/23/2013  . Anemia   . High cholesterol   . Anxiety   . Depression   . Insomnia   . Palpitation   . GERD (gastroesophageal reflux disease)     Past Surgical History:  Procedure Laterality Date  . BREAST REDUCTION SURGERY    . CHOLECYSTECTOMY    . gallstone removal    . WISDOM TOOTH EXTRACTION       OB History   No obstetric history on file.      Home Medications    Prior to Admission medications   Medication Sig Start Date End Date Taking? Authorizing Provider  Prenatal Vit-Fe Fumarate-FA (PRENATAL COMPLETE) 14-0.4 MG TABS Take 1 tablet by mouth daily. 07/21/18   Zadie Rhine, MD    Family History No family history on file.  Social History Social History   Tobacco Use  .  Smoking status: Current Some Day Smoker    Packs/day: 0.50    Types: Cigarettes  . Smokeless tobacco: Never Used  Substance Use Topics  . Alcohol use: No  . Drug use: No     Allergies   Aspirin and Contrast media [iodinated diagnostic agents]   Review of Systems Review of Systems  Constitutional: Positive for chills and fever.  Respiratory: Positive for cough. Negative for shortness of breath.   Cardiovascular: Negative for chest pain.  Gastrointestinal: Positive for nausea and vomiting.  Musculoskeletal: Positive for myalgias.  All other systems reviewed and are negative.    Physical Exam Updated Vital Signs BP 122/82   Pulse 70   Temp 98 F (36.7 C) (Oral)   Resp 16   Ht 1.651 m (5\' 5" )   Wt 63.5 kg   LMP 07/14/2018   SpO2 100%   BMI 23.30 kg/m   Physical Exam  CONSTITUTIONAL: Well developed/well nourished HEAD: Normocephalic/atraumatic EYES: EOMI ENMT: Mucous membranes moist NECK: supple no meningeal signs SPINE/BACK:entire spine nontender CV: S1/S2 noted, no murmurs/rubs/gallops noted LUNGS: Lungs are clear to auscultation bilaterally, no apparent distress ABDOMEN: soft, nontender NEURO: Pt is awake/alert/appropriate, moves all extremitiesx4.  No facial droop.   EXTREMITIES: pulses normal/equal, full ROM SKIN: warm, color normal PSYCH:  no abnormalities of mood noted, alert and oriented to situation  ED Treatments / Results  Labs (all labs ordered are listed, but only abnormal results are displayed) Labs Reviewed  URINALYSIS, ROUTINE W REFLEX MICROSCOPIC - Abnormal; Notable for the following components:      Result Value   Hgb urine dipstick MODERATE (*)    All other components within normal limits  URINALYSIS, MICROSCOPIC (REFLEX) - Abnormal; Notable for the following components:   Bacteria, UA FEW (*)    All other components within normal limits  PREGNANCY, URINE    EKG None  Radiology No results found.  Procedures Procedures (including  critical care time)  Medications Ordered in ED Medications - No data to display   Initial Impression / Assessment and Plan / ED Course  I have reviewed the triage vital signs and the nursing notes.  Pertinent labs  results that were available during my care of the patient were reviewed by me and considered in my medical decision making (see chart for details).     Patient very well-appearing.  Lung sounds are clear, low suspicion for pneumonia. She is in no acute distress.  Probable influenza.  I offered to do labs, patient declines and would like to go home.  She will start on multivitamin iron supplementation due to heavy periods as she thinks this may be contributing to her fatigue. We discussed return precautions  Final Clinical Impressions(s) / ED Diagnoses   Final diagnoses:  Flu-like symptoms    ED Discharge Orders         Ordered    Prenatal Vit-Fe Fumarate-FA (PRENATAL COMPLETE) 14-0.4 MG TABS  Daily     07/21/18 0149           Zadie Rhine, MD 07/21/18 [phone]

## 2018-07-21 NOTE — ED Triage Notes (Signed)
Pt states she had N/V, chills, fatigue that started a weeks. Pt reports the N/V has resolved, but is still having chills and fatigue. Pt also stating she has a cough.

## 2018-11-21 ENCOUNTER — Emergency Department (HOSPITAL_BASED_OUTPATIENT_CLINIC_OR_DEPARTMENT_OTHER)
Admission: EM | Admit: 2018-11-21 | Discharge: 2018-11-21 | Disposition: A | Discharge status: Home / Self Care | Payer: Self-pay | Attending: Emergency Medicine | Admitting: Emergency Medicine

## 2018-11-21 ENCOUNTER — Other Ambulatory Visit: Payer: Self-pay

## 2018-11-21 ENCOUNTER — Encounter (HOSPITAL_BASED_OUTPATIENT_CLINIC_OR_DEPARTMENT_OTHER): Payer: Self-pay

## 2018-11-21 DIAGNOSIS — F419 Anxiety disorder, unspecified: Secondary | ICD-10-CM | POA: Insufficient documentation

## 2018-11-21 DIAGNOSIS — F329 Major depressive disorder, single episode, unspecified: Secondary | ICD-10-CM | POA: Insufficient documentation

## 2018-11-21 DIAGNOSIS — Z79899 Other long term (current) drug therapy: Secondary | ICD-10-CM | POA: Insufficient documentation

## 2018-11-21 DIAGNOSIS — K029 Dental caries, unspecified: Secondary | ICD-10-CM | POA: Insufficient documentation

## 2018-11-21 DIAGNOSIS — Z9049 Acquired absence of other specified parts of digestive tract: Secondary | ICD-10-CM | POA: Insufficient documentation

## 2018-11-21 DIAGNOSIS — F17210 Nicotine dependence, cigarettes, uncomplicated: Secondary | ICD-10-CM | POA: Insufficient documentation

## 2018-11-21 MED ORDER — AMOXICILLIN 500 MG PO CAPS: 500.0000 mg | ORAL_CAPSULE | Freq: Three times a day (TID) | ORAL | 0 refills | Status: DC

## 2018-11-21 MED ORDER — FLUCONAZOLE 150 MG PO TABS: 150.0000 mg | ORAL_TABLET | Freq: Once | ORAL | 0 refills | Status: DC | PRN

## 2018-11-21 NOTE — ED Triage Notes (Signed)
Pt reports with dental pain and abcess x 4 days.

## 2018-11-21 NOTE — Discharge Instructions (Signed)
Please read instructions below. Take the antibiotic, amoxicillin, 3 times per day until they are gone. You can take Naproxen up to 2 times per day with meals, as needed for pain. Schedule an appointment with a dentist, using the dental resource guide attached. Return to the ER for difficulty swallowing or breathing, fever, or new or worsening symptoms.

## 2018-11-21 NOTE — ED Provider Notes (Signed)
[facility] EMERGENCY DEPARTMENT Provider Note   CSN: [id number] Arrival date & time: 11/21/18  1504    History   Chief Complaint Chief Complaint  Patient presents with  . Dental Pain    HPI Patricia Wilcox is a 38 y.o. female presenting to the emergency department with complaint of right upper dental pain x4 days.  Patient states pain had gradual onset, is worse when biting down.  She has a Education officer, community, however she has been unable to schedule appointment due to the current COVID-19 pandemic.  She states she has had pain in this tooth before, she has a hole in it.  It is her upper last molar.  She states she is treated with antibiotics in the past with some relief.  She denies difficulty swallowing or breathing.  Denies fevers.  She has taken over-the-counter medications for symptoms.     The history is provided by the patient.    Past Medical History:  Diagnosis Date  . Anemia   . Anxiety   . Anxiety   . Depression   . GERD (gastroesophageal reflux disease)   . Insomnia   . Palpitation   . PTSD (post-traumatic stress disorder)     Patient Active Problem List   Diagnosis Date Noted  . Chest pain 04/23/2013  . Anemia   . High cholesterol   . Anxiety   . Depression   . Insomnia   . Palpitation   . GERD (gastroesophageal reflux disease)     Past Surgical History:  Procedure Laterality Date  . BREAST REDUCTION SURGERY    . CHOLECYSTECTOMY    . gallstone removal    . WISDOM TOOTH EXTRACTION       OB History   No obstetric history on file.      Home Medications    Prior to Admission medications   Medication Sig Start Date End Date Taking? Authorizing Provider  amoxicillin (AMOXIL) 500 MG capsule Take 1 capsule (500 mg total) by mouth 3 (three) times daily. 11/21/18   , [place] N, PA-C  fluconazole (DIFLUCAN) 150 MG tablet Take 1 tablet (150 mg total) by mouth once as needed for up to 1 dose (yeast infection). 11/21/18   , [place] N, PA-C   Prenatal Vit-Fe Fumarate-FA (PRENATAL COMPLETE) 14-0.4 MG TABS Take 1 tablet by mouth daily. 07/21/18   Zadie Rhine, MD    Family History No family history on file.  Social History Social History   Tobacco Use  . Smoking status: Current Some Day Smoker    Packs/day: 0.50    Types: Cigarettes  . Smokeless tobacco: Never Used  Substance Use Topics  . Alcohol use: No  . Drug use: No     Allergies   Aspirin and Contrast media [iodinated diagnostic agents]   Review of Systems Review of Systems  Constitutional: Negative for fever.  HENT: Positive for dental problem.      Physical Exam Updated Vital Signs BP 136/82 (BP Location: Right Arm)   Pulse 62   Temp 98.3 F (36.8 C) (Oral)   Resp 17   Ht 5\' 4"  (1.626 m)   Wt 65.8 kg   LMP 10/26/2018   SpO2 99%   BMI 24.89 kg/m   Physical Exam Vitals signs and nursing note reviewed.  Constitutional:      General: She is not in acute distress.    Appearance: She is well-developed.  HENT:     Head: Normocephalic and atraumatic.     Mouth/Throat:  Comments: Tolerating secretions.  No trismus.  Right upper last molar with decay and tenderness.  There is no gingival abscess.  No sublingual edema or tenderness. Eyes:     Conjunctiva/sclera: Conjunctivae normal.  Cardiovascular:     Rate and Rhythm: Normal rate.  Pulmonary:     Effort: Pulmonary effort is normal.     Breath sounds: No stridor.  Neurological:     Mental Status: She is alert.  Psychiatric:        Mood and Affect: Mood normal.        Behavior: Behavior normal.      ED Treatments / Results  Labs (all labs ordered are listed, but only abnormal results are displayed) Labs Reviewed - No data to display  EKG None  Radiology No results found.  Procedures Procedures (including critical care time)  Medications Ordered in ED Medications - No data to display   Initial Impression / Assessment and Plan / ED Course  I have reviewed the triage  vital signs and the nursing notes.  Pertinent labs & imaging results that were available during my care of the patient were reviewed by me and considered in my medical decision making (see chart for details).       Patient with dental caries.  No gross abscess.  VSS, afebrile, tolerating secretions. Exam unconcerning for peritonsillar abscess, Ludwig's angina or spread of infection.  Will treat with amoxicillin and pain medicine.  Urged patient to follow-up with dentist as soon as she can get an appointment. Pt safe for discharge.  Discussed results, findings, treatment and follow up. Patient advised of return precautions. Patient verbalized understanding and agreed with plan.   Final Clinical Impressions(s) / ED Diagnoses   Final diagnoses:  Pain due to dental caries    ED Discharge Orders         Ordered    amoxicillin (AMOXIL) 500 MG capsule  3 times daily     11/21/18 1548    fluconazole (DIFLUCAN) 150 MG tablet  Once PRN     11/21/18 1549           , [place] N, [place]-C 11/21/18 1605    Charlynne PanderYao, David Hsienta, MD 11/21/18 2231

## 2019-01-31 ENCOUNTER — Encounter (HOSPITAL_BASED_OUTPATIENT_CLINIC_OR_DEPARTMENT_OTHER): Payer: Self-pay | Admitting: Emergency Medicine

## 2019-01-31 ENCOUNTER — Emergency Department (HOSPITAL_BASED_OUTPATIENT_CLINIC_OR_DEPARTMENT_OTHER)
Admission: EM | Admit: 2019-01-31 | Discharge: 2019-01-31 | Disposition: A | Discharge status: Home / Self Care | Payer: Self-pay | Attending: Emergency Medicine | Admitting: Emergency Medicine

## 2019-01-31 ENCOUNTER — Other Ambulatory Visit: Payer: Self-pay

## 2019-01-31 DIAGNOSIS — Z79899 Other long term (current) drug therapy: Secondary | ICD-10-CM | POA: Insufficient documentation

## 2019-01-31 DIAGNOSIS — F17210 Nicotine dependence, cigarettes, uncomplicated: Secondary | ICD-10-CM | POA: Insufficient documentation

## 2019-01-31 DIAGNOSIS — F419 Anxiety disorder, unspecified: Secondary | ICD-10-CM | POA: Insufficient documentation

## 2019-01-31 MED ORDER — ALPRAZOLAM 0.5 MG PO TABS: 0.5000 mg | ORAL_TABLET | Freq: Every evening | ORAL | 0 refills | Status: AC | PRN

## 2019-01-31 NOTE — Discharge Instructions (Signed)
I have provided a short course of abortive medication to help with panic attacks.  Resources to counselors along with mental health representatives are attached to your chart.  Please schedule an appointment as soon as possible in order to be seen by 1 of the counselors.  If you experience any chest pain, shortness of breath, worsening symptoms please return to the emergency department.

## 2019-01-31 NOTE — ED Triage Notes (Signed)
Reports hx of anxiety and panic attacks.  Has not taken anything for over a year.  Getting worse with the covid situation.

## 2019-01-31 NOTE — ED Provider Notes (Signed)
[facility] EMERGENCY DEPARTMENT Provider Note   CSN: [id number] Arrival date & time: 01/31/19  1916    History   Chief Complaint Chief Complaint  Patient presents with  . Anxiety    HPI Patricia Wilcox is a 38 y.o. female.     [age] .yo female with a PMH of Anemia, Anxiety disorder presents to the ED with a chief complaint of recurrent panic attacks. Patient reports in the past 2 weeks, she has had 5 panic attacks, states the begin with feeling flush, states she feel like she can't breath and hands get clammy. She reports she was seen by a psychiatrist in the past who placed her on abortive therapy, however, she had take this medication in the past due to fear of medication. Today she reports her panic attacks are worsening and she would like to be started on therapy. She denies any chest pain, shortness of breath, SI, HI or hallucinations.   The history is provided by the patient and medical records.  Anxiety Pertinent negatives include no chest pain, no abdominal pain and no shortness of breath.    Past Medical History:  Diagnosis Date  . Anemia   . Anxiety   . Anxiety   . Depression   . GERD (gastroesophageal reflux disease)   . Insomnia   . Palpitation   . PTSD (post-traumatic stress disorder)     Patient Active Problem List   Diagnosis Date Noted  . Chest pain 04/23/2013  . Anemia   . High cholesterol   . Anxiety   . Depression   . Insomnia   . Palpitation   . GERD (gastroesophageal reflux disease)     Past Surgical History:  Procedure Laterality Date  . BREAST REDUCTION SURGERY    . CHOLECYSTECTOMY    . gallstone removal    . WISDOM TOOTH EXTRACTION       OB History   No obstetric history on file.      Home Medications    Prior to Admission medications   Medication Sig Start Date End Date Taking? Authorizing Provider  ibuprofen (ADVIL) 400 MG tablet Take 400 mg by mouth daily as needed.   Yes [provider]  ALPRAZolam  (XANAX) 0.5 MG tablet Take 1 tablet (0.5 mg total) by mouth at bedtime as needed for up to 5 days for anxiety (panic attacks). 01/31/19 02/05/19  Claude MangesSoto, Bayan Hedstrom, PA-C  amoxicillin (AMOXIL) 500 MG capsule Take 1 capsule (500 mg total) by mouth 3 (three) times daily. 11/21/18   Robinson, [place] N, PA-C  fluconazole (DIFLUCAN) 150 MG tablet Take 1 tablet (150 mg total) by mouth once as needed for up to 1 dose (yeast infection). 11/21/18   Robinson, [place] N, PA-C  Prenatal Vit-Fe Fumarate-FA (PRENATAL COMPLETE) 14-0.4 MG TABS Take 1 tablet by mouth daily. 07/21/18   Zadie RhineWickline, Donald, MD    Family History No family history on file.  Social History Social History   Tobacco Use  . Smoking status: Current Some Day Smoker    Packs/day: 0.50    Types: Cigarettes  . Smokeless tobacco: Never Used  Substance Use Topics  . Alcohol use: No  . Drug use: No     Allergies   Aspirin and Contrast media [iodinated diagnostic agents]   Review of Systems Review of Systems  Constitutional: Negative for fever.  Respiratory: Negative for shortness of breath.   Cardiovascular: Negative for chest pain.  Gastrointestinal: Negative for abdominal pain.  Psychiatric/Behavioral: The patient is  nervous/anxious.      Physical Exam Updated Vital Signs BP (!) 141/91 (BP Location: Right Arm)   Pulse 73   Temp 98.1 F (36.7 C) (Oral)   Resp 18   Ht 5\' 4"  (1.626 m)   Wt 65.3 kg   SpO2 100%   BMI 24.72 kg/m   Physical Exam Vitals signs and nursing note reviewed.  Constitutional:      General: She is not in acute distress.    Appearance: She is well-developed.  HENT:     Head: Normocephalic and atraumatic.     Mouth/Throat:     Pharynx: No oropharyngeal exudate.  Eyes:     Pupils: Pupils are equal, round, and reactive to light.  Neck:     Musculoskeletal: Normal range of motion.  Cardiovascular:     Rate and Rhythm: Regular rhythm.     Heart sounds: Normal heart sounds.  Pulmonary:     Effort:  Pulmonary effort is normal. No respiratory distress.     Breath sounds: Normal breath sounds.  Abdominal:     General: Bowel sounds are normal. There is no distension.     Palpations: Abdomen is soft.     Tenderness: There is no abdominal tenderness.  Musculoskeletal:        General: No tenderness or deformity.     Right lower leg: No edema.     Left lower leg: No edema.  Skin:    General: Skin is warm and dry.  Neurological:     Mental Status: She is alert and oriented to person, place, and time.      ED Treatments / Results  Labs (all labs ordered are listed, but only abnormal results are displayed) Labs Reviewed - No data to display  EKG None  Radiology No results found.  Procedures Procedures (including critical care time)  Medications Ordered in ED Medications - No data to display   Initial Impression / Assessment and Plan / ED Course  I have reviewed the triage vital signs and the nursing notes.  Pertinent labs & imaging results that were available during my care of the patient were reviewed by me and considered in my medical decision making (see chart for details).     Patient with a past medical history of anxiety disorder, previously placed on Xanax along with Valium presents to the ED with complaints of recurrent panic attacks.  Reports she is had 5 panic attacks in the past 2 weeks, states she feels her hand side, states that COVID-19 has worsened her symptoms.  She was originally prescribed Xanax along with Valium in the past however she has not follow-up with any psychiatric entity.  According to patient's chart which have extensively review, she was seen in the past for anxiety disorder, does have some prescriptions for Xanax along with Valium.  Due to patient's symptoms affecting her work and life will provide her with a short course of abortive therapy of Xanax 0.5 to take as abortive therapy for her panic attacks.  Patient was encouraged to follow-up with  mental counselors.  She is aware she cannot get a full prescription of Xanax while in the ED and will need to follow-up with a behavioral specialist.  Patient has denied any chest pain, shortness of breath, other complaints.  [employer] reviewed at length.  Return precautions discussed with patient.  Portions of this note were generated with Lobbyist. Dictation errors may occur despite best attempts at proofreading.  Final Clinical Impressions(s) / ED Diagnoses   Final diagnoses:  Anxiety disorder, unspecified type    ED Discharge Orders         Ordered    ALPRAZolam (XANAX) 0.5 MG tablet  At bedtime PRN    Note to Pharmacy: Attempted to e prescribed several times. Please take prescription printed.   01/31/19 2024           Claude MangesSoto, Bashar Milam, PA-C 01/31/19 2030    Terrilee FilesButler, Michael C, MD 02/01/19 1539

## 2019-03-03 ENCOUNTER — Encounter (HOSPITAL_BASED_OUTPATIENT_CLINIC_OR_DEPARTMENT_OTHER): Payer: Self-pay | Admitting: Emergency Medicine

## 2019-03-03 ENCOUNTER — Other Ambulatory Visit: Payer: Self-pay

## 2019-03-03 ENCOUNTER — Emergency Department (HOSPITAL_BASED_OUTPATIENT_CLINIC_OR_DEPARTMENT_OTHER)
Admission: EM | Admit: 2019-03-03 | Discharge: 2019-03-03 | Disposition: A | Discharge status: Home / Self Care | Payer: Self-pay | Attending: Emergency Medicine | Admitting: Emergency Medicine

## 2019-03-03 DIAGNOSIS — F17210 Nicotine dependence, cigarettes, uncomplicated: Secondary | ICD-10-CM | POA: Insufficient documentation

## 2019-03-03 DIAGNOSIS — K047 Periapical abscess without sinus: Secondary | ICD-10-CM

## 2019-03-03 DIAGNOSIS — K029 Dental caries, unspecified: Secondary | ICD-10-CM | POA: Insufficient documentation

## 2019-03-03 DIAGNOSIS — K0381 Cracked tooth: Secondary | ICD-10-CM | POA: Insufficient documentation

## 2019-03-03 DIAGNOSIS — L240 Irritant contact dermatitis due to detergents: Secondary | ICD-10-CM

## 2019-03-03 DIAGNOSIS — S025XXA Fracture of tooth (traumatic), initial encounter for closed fracture: Secondary | ICD-10-CM

## 2019-03-03 HISTORY — DX: Periapical abscess without sinus: K04.7

## 2019-03-03 MED ORDER — AMOXICILLIN 500 MG PO CAPS: 500.0000 mg | ORAL_CAPSULE | Freq: Three times a day (TID) | ORAL | 0 refills | Status: DC

## 2019-03-03 MED FILL — AMOXICILLIN 500 MG CAPSULE: 500 | 7 days supply | Qty: 21 | Fill #0

## 2019-03-03 NOTE — ED Provider Notes (Signed)
[facility] EMERGENCY DEPARTMENT Provider Note   CSN: [id number] Arrival date & time: 03/03/19  [id number]     History   Chief Complaint Chief Complaint  Patient presents with  . Dental Pain    HPI Patricia Wilcox is a 38 y.o. female.     The history is provided by the patient.  Dental Pain Location:  Upper Upper teeth location:  1/RU 3rd molar Quality:  Aching, constant and localized Severity:  Moderate Onset quality:  Gradual Duration:  1 week Timing:  Constant Progression:  Worsening Chronicity:  Recurrent Context: dental caries and poor dentition   Context comment:  Piece of the tooth chipped off about 1 week ago Relieved by:  None tried Worsened by:  Cold food/drink Ineffective treatments:  None tried Associated symptoms: facial pain   Associated symptoms: no difficulty swallowing, no facial swelling, no fever, no gum swelling, no oral bleeding, no oral lesions and no trismus   Risk factors: lack of dental care and periodontal disease     Past Medical History:  Diagnosis Date  . Anemia   . Anxiety   . Anxiety   . Dental abscess   . Depression   . GERD (gastroesophageal reflux disease)   . Insomnia   . Palpitation   . PTSD (post-traumatic stress disorder)     Patient Active Problem List   Diagnosis Date Noted  . Chest pain 04/23/2013  . Anemia   . High cholesterol   . Anxiety   . Depression   . Insomnia   . Palpitation   . GERD (gastroesophageal reflux disease)     Past Surgical History:  Procedure Laterality Date  . BREAST REDUCTION SURGERY    . CHOLECYSTECTOMY    . gallstone removal    . WISDOM TOOTH EXTRACTION       OB History   No obstetric history on file.      Home Medications    Prior to Admission medications   Medication Sig Start Date End Date Taking? Authorizing Provider  amoxicillin (AMOXIL) 500 MG capsule Take 1 capsule (500 mg total) by mouth 3 (three) times daily. 03/03/19   Gwyneth SproutPlunkett, Zyana Amaro, MD    Family  History No family history on file.  Social History Social History   Tobacco Use  . Smoking status: Current Some Day Smoker    Packs/day: 0.50    Types: Cigarettes  . Smokeless tobacco: Never Used  Substance Use Topics  . Alcohol use: No  . Drug use: No     Allergies   Aspirin and Contrast media [iodinated diagnostic agents]   Review of Systems Review of Systems  Constitutional: Negative for fever.  HENT: Negative for facial swelling and mouth sores.   All other systems reviewed and are negative.    Physical Exam Updated Vital Signs BP 116/77 (BP Location: Right Arm)   Pulse 68   Temp 98.3 F (36.8 C) (Oral)   Resp 18   Ht 5\' 3"  (1.6 m)   Wt 63 kg   SpO2 100%   BMI 24.62 kg/m   Physical Exam Vitals signs and nursing note reviewed.  Constitutional:      General: She is not in acute distress.    Appearance: Normal appearance. She is normal weight.  HENT:     Head: Normocephalic.     Nose: Nose normal.     Mouth/Throat:     Mouth: Mucous membranes are moist.   Eyes:     Pupils: Pupils are  equal, round, and reactive to light.  Cardiovascular:     Rate and Rhythm: Normal rate.  Pulmonary:     Effort: Pulmonary effort is normal.  Skin:    General: Skin is warm.     Findings: Rash present.     Comments: Papular rash with skin discoloration over dorsum of both hands.  Neurological:     General: No focal deficit present.     Mental Status: She is alert and oriented to person, place, and time. Mental status is at baseline.  Psychiatric:        Mood and Affect: Mood normal.        Behavior: Behavior normal.        Thought Content: Thought content normal.      ED Treatments / Results  Labs (all labs ordered are listed, but only abnormal results are displayed) Labs Reviewed - No data to display  EKG None  Radiology No results found.  Procedures Procedures (including critical care time)  Medications Ordered in ED Medications - No data to  display   Initial Impression / Assessment and Plan / ED Course  I have reviewed the triage vital signs and the nursing notes.  Pertinent labs & imaging results that were available during my care of the patient were reviewed by me and considered in my medical decision making (see chart for details).        Pt with dental caries and no facial swelling.  No signs of ludwig's angina or difficulty swallowing and no systemic symptoms. Will treat with amoxicillin and have pt f/u with dentist.  Also patient with evidence of contact dermatitis of both hands related to excessive handwashing, hand sanitizer use and repeated gloving.  Patient will use hydrocortisone and a thick lotion for treatment.   Final Clinical Impressions(s) / ED Diagnoses   Final diagnoses:  Closed fracture of tooth, initial encounter  Infected dental caries  Irritant contact dermatitis due to detergent    ED Discharge Orders         Ordered    amoxicillin (AMOXIL) 500 MG capsule  3 times daily     03/03/19 0844           Blanchie Dessert, MD 03/03/19 [phone]

## 2019-03-03 NOTE — Discharge Instructions (Signed)
Place hydrocortisone on your hands 2 times a day and then use a thick lotion such as Aveeno, Eucerin or Vaseline.  It will also be important to follow-up with a dentist about your tooth

## 2019-03-03 NOTE — ED Triage Notes (Signed)
Pt states she chipped a tooth.  Pt has history of dental issues but not seen dentist.

## 2019-03-16 ENCOUNTER — Emergency Department (HOSPITAL_BASED_OUTPATIENT_CLINIC_OR_DEPARTMENT_OTHER)
Admission: EM | Admit: 2019-03-16 | Discharge: 2019-03-16 | Disposition: A | Discharge status: Home / Self Care | Payer: Self-pay | Attending: Emergency Medicine | Admitting: Emergency Medicine

## 2019-03-16 ENCOUNTER — Encounter (HOSPITAL_BASED_OUTPATIENT_CLINIC_OR_DEPARTMENT_OTHER): Payer: Self-pay | Admitting: Adult Health

## 2019-03-16 ENCOUNTER — Other Ambulatory Visit: Payer: Self-pay

## 2019-03-16 DIAGNOSIS — H6691 Otitis media, unspecified, right ear: Secondary | ICD-10-CM | POA: Insufficient documentation

## 2019-03-16 DIAGNOSIS — F17210 Nicotine dependence, cigarettes, uncomplicated: Secondary | ICD-10-CM | POA: Insufficient documentation

## 2019-03-16 DIAGNOSIS — H6690 Otitis media, unspecified, unspecified ear: Secondary | ICD-10-CM

## 2019-03-16 MED ORDER — AMOXICILLIN 500 MG PO CAPS: 500.0000 mg | ORAL_CAPSULE | Freq: Three times a day (TID) | ORAL | 0 refills | Status: DC

## 2019-03-16 NOTE — ED Triage Notes (Signed)
Present with right ear pain that began yesterday. TM erythamatous. Denies drainage.

## 2019-03-16 NOTE — Discharge Instructions (Signed)
Take antibiotics as directed. Please take all of your antibiotics until finished.  You can take Tylenol or Ibuprofen as directed for pain. You can alternate Tylenol and Ibuprofen every 4 hours. If you take Tylenol at 1pm, then you can take Ibuprofen at 5pm. Then you can take Tylenol again at 9pm.   Return emergency department for any fever, worsening pain or redness or swelling of your face or any other worsening or concerning symptoms.

## 2019-03-16 NOTE — ED Provider Notes (Signed)
[facility] EMERGENCY DEPARTMENT Provider Note   CSN: [id number] Arrival date & time: 03/16/19  1625     History   Chief Complaint Chief Complaint  Patient presents with  . Otalgia    HPI Patricia Wilcox is a 38 y.o. female who presents for evaluation of right ear pain that began yesterday.  Patient reports that she has not had any fevers.  She has taken 1 dose of Tylenol for pain but did not have much improvement.  She has not noted any drainage.  She was seen here 03/03/2019 for evaluation of right-sided dental pain.  At that time, she was discharged home with amoxicillin.  Patient states she took it for a few days and then started feeling better so she stopped taking the antibiotic.  She states that the dental pain is improved and has not bothered her since then.  Patient states she has not had any fever, drainage from the ear.     The history is provided by the patient.    Past Medical History:  Diagnosis Date  . Anemia   . Anxiety   . Anxiety   . Dental abscess   . Depression   . GERD (gastroesophageal reflux disease)   . Insomnia   . Palpitation   . PTSD (post-traumatic stress disorder)     Patient Active Problem List   Diagnosis Date Noted  . Chest pain 04/23/2013  . Anemia   . High cholesterol   . Anxiety   . Depression   . Insomnia   . Palpitation   . GERD (gastroesophageal reflux disease)     Past Surgical History:  Procedure Laterality Date  . BREAST REDUCTION SURGERY    . CHOLECYSTECTOMY    . gallstone removal    . WISDOM TOOTH EXTRACTION       OB History   No obstetric history on file.      Home Medications    Prior to Admission medications   Medication Sig Start Date End Date Taking? Authorizing Provider  amoxicillin (AMOXIL) 500 MG capsule Take 1 capsule (500 mg total) by mouth 3 (three) times daily. 03/16/19   Volanda Napoleon, PA-C    Family History History reviewed. No pertinent family history.  Social History Social  History   Tobacco Use  . Smoking status: Current Some Day Smoker    Packs/day: 0.50    Types: Cigarettes  . Smokeless tobacco: Never Used  Substance Use Topics  . Alcohol use: No  . Drug use: No     Allergies   Aspirin and Contrast media [iodinated diagnostic agents]   Review of Systems Review of Systems  Constitutional: Negative for fever.  HENT: Positive for ear pain. Negative for dental problem.   All other systems reviewed and are negative.    Physical Exam Updated Vital Signs BP 122/78 (BP Location: Left Arm)   Pulse 78   Temp 99 F (37.2 C) (Oral)   Resp 16   Ht 5\' 3"  (1.6 m)   SpO2 98%   BMI 24.62 kg/m   Physical Exam Vitals signs and nursing note reviewed.  Constitutional:      Appearance: She is well-developed.  HENT:     Head: Normocephalic and atraumatic.     Right Ear: No mastoid tenderness. Tympanic membrane is erythematous and bulging.     Left Ear: Tympanic membrane normal. No mastoid tenderness.     Ears:     Comments: Right TM is erythematous and slightly bulging.  Left TM is normal.  No pain, redness, tenderness, warmth noted to mastoid process bilaterally.    Mouth/Throat:     Comments: No obvious dental abscess. Eyes:     General: No scleral icterus.       Right eye: No discharge.        Left eye: No discharge.     Conjunctiva/sclera: Conjunctivae normal.  Pulmonary:     Effort: Pulmonary effort is normal.  Skin:    General: Skin is warm and dry.  Neurological:     Mental Status: She is alert.  Psychiatric:        Speech: Speech normal.        Behavior: Behavior normal.      ED Treatments / Results  Labs (all labs ordered are listed, but only abnormal results are displayed) Labs Reviewed - No data to display  EKG None  Radiology No results found.  Procedures Procedures (including critical care time)  Medications Ordered in ED Medications - No data to display   Initial Impression / Assessment and Plan / ED Course   I have reviewed the triage vital signs and the nursing notes.  Pertinent labs & imaging results that were available during my care of the patient were reviewed by me and considered in my medical decision making (see chart for details).        38 year old female who presents for evaluation of right ear pain that began yesterday.  Seen here a few weeks ago for evaluation of dental pain and was prescribed a course of antibiotics but did not finish.  States she has not any fever or drainage from the ear.  States that her dental pain has improved. Patient is afebrile, non-toxic appearing, sitting comfortably on examination table. Vital signs reviewed and stable.  On exam, right TM is erythematous and bulging.  Left TM is within normal limits.  Concern for acute otitis media.  History/physical exam is not concerning for mastoiditis.  Additionally, do not feel this is odontogenic in nature.  Will plan to treat with short course of antibiotic to treat for acute otitis media. At this time, patient exhibits no emergent life-threatening condition that require further evaluation in ED or admission. Patient had ample opportunity for questions and discussion. All patient's questions were answered with full understanding. Strict return precautions discussed. Patient expresses understanding and agreement to plan.   Portions of this note were generated with Scientist, clinical (histocompatibility and immunogenetics). Dictation errors may occur despite best attempts at proofreading.  Final Clinical Impressions(s) / ED Diagnoses   Final diagnoses:  Acute otitis media, unspecified otitis media type    ED Discharge Orders         Ordered    amoxicillin (AMOXIL) 500 MG capsule  3 times daily     03/16/19 1724           Rosana Hoes 03/16/19 2232    Vanetta Mulders, MD 03/22/19 [phone]

## 2019-05-07 ENCOUNTER — Emergency Department (HOSPITAL_BASED_OUTPATIENT_CLINIC_OR_DEPARTMENT_OTHER)
Admission: EM | Admit: 2019-05-07 | Discharge: 2019-05-07 | Disposition: A | Discharge status: Home / Self Care | Payer: Self-pay | Attending: Emergency Medicine | Admitting: Emergency Medicine

## 2019-05-07 ENCOUNTER — Other Ambulatory Visit: Payer: Self-pay

## 2019-05-07 ENCOUNTER — Encounter (HOSPITAL_BASED_OUTPATIENT_CLINIC_OR_DEPARTMENT_OTHER): Payer: Self-pay | Admitting: *Deleted

## 2019-05-07 DIAGNOSIS — F17210 Nicotine dependence, cigarettes, uncomplicated: Secondary | ICD-10-CM | POA: Insufficient documentation

## 2019-05-07 DIAGNOSIS — K029 Dental caries, unspecified: Secondary | ICD-10-CM | POA: Insufficient documentation

## 2019-05-07 MED ORDER — AMOXICILLIN 500 MG PO CAPS: 500.0000 mg | ORAL_CAPSULE | Freq: Three times a day (TID) | ORAL | 0 refills | Status: DC

## 2019-05-07 MED ORDER — FLUCONAZOLE 150 MG PO TABS: 150.0000 mg | ORAL_TABLET | Freq: Once | ORAL | 0 refills | Status: DC | PRN

## 2019-05-07 MED FILL — FLUCONAZOLE 150 MG TABS: 150 | 1 days supply | Qty: 1 | Fill #0

## 2019-05-07 MED FILL — AMOXICILLIN 500 MG CAPSULE: 500 | 7 days supply | Qty: 21 | Fill #0

## 2019-05-07 NOTE — ED Provider Notes (Signed)
[facility] EMERGENCY DEPARTMENT Provider Note   CSN: [id number] Arrival date & time: 05/07/19  1602     History   Chief Complaint Chief Complaint  Patient presents with  . Dental Pain    HPI Patricia SEESE is a 38 y.o. female presenting to the emergency department with complaint of right upper tooth ache for 3 days.  She states she has been having intermittent issues with this tooth for about a year now.  She has been seeing a dentist for her other teeth, however has to space out her dental procedures due to financial purposes.  She states she has been told she needs a root canal on this tooth and is waiting until she can afford getting this fixed.  She presents today with request for antibiotic.  Denies difficulty swallowing or breathing, fever, drainage in the mouth.  She is treated her symptoms with ibuprofen.     The history is provided by the patient.    Past Medical History:  Diagnosis Date  . Anemia   . Anxiety   . Anxiety   . Dental abscess   . Depression   . GERD (gastroesophageal reflux disease)   . Insomnia   . Palpitation   . PTSD (post-traumatic stress disorder)     Patient Active Problem List   Diagnosis Date Noted  . Chest pain 04/23/2013  . Anemia   . High cholesterol   . Anxiety   . Depression   . Insomnia   . Palpitation   . GERD (gastroesophageal reflux disease)     Past Surgical History:  Procedure Laterality Date  . BREAST REDUCTION SURGERY    . CHOLECYSTECTOMY    . gallstone removal    . WISDOM TOOTH EXTRACTION       OB History   No obstetric history on file.      Home Medications    Prior to Admission medications   Medication Sig Start Date End Date Taking? Authorizing Provider  amoxicillin (AMOXIL) 500 MG capsule Take 1 capsule (500 mg total) by mouth 3 (three) times daily. 05/07/19   Vedder Brittian, [place] N, PA-C  fluconazole (DIFLUCAN) 150 MG tablet Take 1 tablet (150 mg total) by mouth once as needed for up to 1  dose (yeast infection). 05/07/19   Zyan Coby, [place] N, PA-C    Family History No family history on file.  Social History Social History   Tobacco Use  . Smoking status: Current Some Day Smoker    Packs/day: 0.50    Types: Cigarettes  . Smokeless tobacco: Never Used  Substance Use Topics  . Alcohol use: No  . Drug use: No     Allergies   Aspirin and Contrast media [iodinated diagnostic agents]   Review of Systems Review of Systems  Constitutional: Negative for fever.  HENT: Positive for dental problem.      Physical Exam Updated Vital Signs BP 131/77   Pulse 84   Temp 98.3 F (36.8 C) (Oral)   Resp 18   Ht 5\' 3"  (1.6 m)   Wt 61.2 kg   LMP 04/30/2019   SpO2 100%   BMI 23.91 kg/m   Physical Exam Vitals signs and nursing note reviewed.  Constitutional:      General: She is not in acute distress.    Appearance: She is well-developed. She is not ill-appearing.  HENT:     Head: Normocephalic and atraumatic.     Mouth/Throat:     Mouth: Mucous membranes are moist.  Comments: Dental caries present.  Right last upper molar with tenderness.  No gingival erythema or fluctuance.  Uvula is midline, no swelling or trismus. Eyes:     Conjunctiva/sclera: Conjunctivae normal.  Neck:     Musculoskeletal: Normal range of motion. No muscular tenderness.  Cardiovascular:     Rate and Rhythm: Normal rate.  Pulmonary:     Effort: Pulmonary effort is normal.  Lymphadenopathy:     Cervical: No cervical adenopathy.  Neurological:     Mental Status: She is alert.  Psychiatric:        Mood and Affect: Mood normal.        Behavior: Behavior normal.      ED Treatments / Results  Labs (all labs ordered are listed, but only abnormal results are displayed) Labs Reviewed - No data to display  EKG None  Radiology No results found.  Procedures Procedures (including critical care time)  Medications Ordered in ED Medications - No data to display   Initial  Impression / Assessment and Plan / ED Course  I have reviewed the triage vital signs and the nursing notes.  Pertinent labs & imaging results that were available during my care of the patient were reviewed by me and considered in my medical decision making (see chart for details).        Patient with dental caries.  No gross abscess.  VSS, afebrile, tolerating secretions. Exam unconcerning for peritonsillar abscess, Ludwig's angina or spread of infection.  Will treat with abx and OTC pain medicine.  Patient requesting amoxicillin over penicillin as penicillin causes her to have significantly worse vaginal yeast infection.  Amoxicillin prescribed, as well as Diflucan for possible subsequent yeast infection.  Urged patient to follow-up with dentist. Pt safe for discharge.  Final Clinical Impressions(s) / ED Diagnoses   Final diagnoses:  Pain due to dental caries    ED Discharge Orders         Ordered    amoxicillin (AMOXIL) 500 MG capsule  3 times daily     05/07/19 1659    fluconazole (DIFLUCAN) 150 MG tablet  Once PRN     05/07/19 1659           Jareth Pardee, [place] N, PA-C 05/07/19 1802    Fredia Sorrow, MD 05/16/19 [phone]

## 2019-05-07 NOTE — Discharge Instructions (Addendum)
Please read instructions below. Take the antibiotic 3 times per day until they are gone. You can continue taking the ibuprofen every 6 hours, as needed for pain. Schedule an appointment with your dentist for further management. Return to the ER for difficulty swallowing or breathing, fever, or new or worsening symptoms.

## 2019-05-07 NOTE — ED Triage Notes (Signed)
Toothache x 3 days

## 2019-12-03 ENCOUNTER — Other Ambulatory Visit: Payer: Self-pay

## 2019-12-03 ENCOUNTER — Encounter (HOSPITAL_BASED_OUTPATIENT_CLINIC_OR_DEPARTMENT_OTHER): Payer: Self-pay

## 2019-12-03 ENCOUNTER — Emergency Department (HOSPITAL_BASED_OUTPATIENT_CLINIC_OR_DEPARTMENT_OTHER)
Admission: EM | Admit: 2019-12-03 | Discharge: 2019-12-03 | Disposition: A | Discharge status: Home / Self Care | Payer: Self-pay | Attending: Emergency Medicine | Admitting: Emergency Medicine

## 2019-12-03 DIAGNOSIS — S46911A Strain of unspecified muscle, fascia and tendon at shoulder and upper arm level, right arm, initial encounter: Secondary | ICD-10-CM | POA: Insufficient documentation

## 2019-12-03 DIAGNOSIS — Y92520 Airport as the place of occurrence of the external cause: Secondary | ICD-10-CM | POA: Insufficient documentation

## 2019-12-03 DIAGNOSIS — Y9389 Activity, other specified: Secondary | ICD-10-CM | POA: Insufficient documentation

## 2019-12-03 DIAGNOSIS — X500XXA Overexertion from strenuous movement or load, initial encounter: Secondary | ICD-10-CM | POA: Insufficient documentation

## 2019-12-03 DIAGNOSIS — Z87891 Personal history of nicotine dependence: Secondary | ICD-10-CM | POA: Insufficient documentation

## 2019-12-03 DIAGNOSIS — Y998 Other external cause status: Secondary | ICD-10-CM | POA: Insufficient documentation

## 2019-12-03 MED ORDER — DICLOFENAC SODIUM 1 % EX GEL: 2.0000 g | Freq: Four times a day (QID) | CUTANEOUS | 0 refills | Status: DC

## 2019-12-03 NOTE — ED Notes (Signed)
Rt mid to upper arm pain x 1.5 weeks  Unsure of inj

## 2019-12-03 NOTE — ED Triage Notes (Signed)
Pt c/o R arm pain x 1.5 weeks. Pain is progressively getting worse and is worse with movement. Pt states she does a lot of heavy pushing and pulling. Pain is worse in the AM.

## 2019-12-03 NOTE — Discharge Instructions (Signed)
Follow instructions regarding range of motion exercises. Use the Voltaren gel as directed. Follow-up with your primary care provider. Return to the ER for worsening pain, injuries or falls, changes to sensation or weakness.

## 2019-12-03 NOTE — ED Provider Notes (Signed)
[facility] EMERGENCY DEPARTMENT Provider Note   CSN: [id number] Arrival date & time: 12/03/19  1428     History Chief Complaint  Patient presents with  . Arm Pain    Patricia Wilcox is a 39 y.o. female with a past medical history of anxiety, depression, presenting to the ED with a chief complaint of right arm pain.  States for the past 1.5 weeks she has had aching right arm pain that radiates from her shoulder.  She attributes this to lifting and pushing at work. She states [place] wheelch[place]  Pain is worse early in the morning and improves throughout the day.  She has tried NSAIDs with some improvement in her symptoms.  She also reports pain worse with overhead reaching.  She denies any injuries or falls, changes to sensation, weakness, chest pain, shortness of breath, prior fracture, dislocations or procedures in the area.  HPI     Past Medical History:  Diagnosis Date  . Anemia   . Anxiety   . Anxiety   . Dental abscess   . Depression   . GERD (gastroesophageal reflux disease)   . Insomnia   . Palpitation   . PTSD (post-traumatic stress disorder)     Patient Active Problem List   Diagnosis Date Noted  . Chest pain 04/23/2013  . Anemia   . High cholesterol   . Anxiety   . Depression   . Insomnia   . Palpitation   . GERD (gastroesophageal reflux disease)     Past Surgical History:  Procedure Laterality Date  . BREAST REDUCTION SURGERY    . CHOLECYSTECTOMY    . gallstone removal    . WISDOM TOOTH EXTRACTION       OB History   No obstetric history on file.     No family history on file.  Social History   Tobacco Use  . Smoking status: Current Some Day Smoker    Packs/day: 0.50    Types: Cigarettes  . Smokeless tobacco: Never Used  Vaping Use  . Vaping Use: Some days  Substance Use Topics  . Alcohol use: No  . Drug use: No    Home Medications Prior to Admission medications   Medication Sig Start  Date End Date Taking? Authorizing Provider  amoxicillin (AMOXIL) 500 MG capsule Take 1 capsule (500 mg total) by mouth 3 (three) times daily. 05/07/19   Robinson, [place] N, PA-C  diclofenac Sodium (VOLTAREN) 1 % GEL Apply 2 g topically 4 (four) times daily. 12/03/19   Jimel Myler, PA-C  fluconazole (DIFLUCAN) 150 MG tablet Take 1 tablet (150 mg total) by mouth once as needed for up to 1 dose (yeast infection). 05/07/19   Robinson, [place] N, PA-C    Allergies    Aspirin and Contrast media [iodinated diagnostic agents]  Review of Systems   Review of Systems  Constitutional: Negative for chills and fever.  Musculoskeletal: Positive for myalgias.  Skin: Negative for wound.  Neurological: Negative for weakness and numbness.    Physical Exam Updated Vital Signs BP 107/78 (BP Location: Left Arm)   Pulse 87   Temp 98.6 F (37 C) (Oral)   Resp 20   Ht 5\' 3"  (1.6 m)   Wt 57.2 kg   LMP 11/20/2019   SpO2 100%   BMI 22.32 kg/m   Physical Exam Vitals and nursing note reviewed.  Constitutional:      General: She is not in acute distress.  Appearance: She is well-developed. She is not diaphoretic.  HENT:     Head: Normocephalic and atraumatic.  Eyes:     General: No scleral icterus.    Conjunctiva/sclera: Conjunctivae normal.  Cardiovascular:     Rate and Rhythm: Normal rate and regular rhythm.     Heart sounds: Normal heart sounds.  Pulmonary:     Effort: Pulmonary effort is normal. No respiratory distress.     Breath sounds: Normal breath sounds.  Musculoskeletal:     Right shoulder: Tenderness present.       Arms:     Cervical back: Normal range of motion.     Comments: Tenderness to palpation of the indicated area without significant changes to range of motion of right shoulder.  No deformities, erythema or warmth of joint noted.  2+ radial pulses noted bilaterally.  Strength 5/5 in bilateral upper extremities.  Skin:    Findings: No rash.  Neurological:     Mental  Status: She is alert.     ED Results / Procedures / Treatments   Labs (all labs ordered are listed, but only abnormal results are displayed) Labs Reviewed - No data to display  EKG None  Radiology No results found.  Procedures Procedures (including critical care time)  Medications Ordered in ED Medications - No data to display  ED Course  I have reviewed the triage vital signs and the nursing notes.  Pertinent labs & imaging results that were available during my care of the patient were reviewed by me and considered in my medical decision making (see chart for details).    MDM Rules/Calculators/A&P                          39 year old female presenting to the ED for right shoulder and arm pain after persistent pushing and lifting at work.  Some improvement noted with NSAIDs.  No trauma noted.  On exam there are 10 palpation of the shoulder and right upper arm without changes to range of motion.  No deformities, erythema or warmth of joint noted.  Area is neurovascularly intact.  Suspect that symptoms could be due to strain.  We will have her practice range of motion, give Voltaren gel and have her follow-up with PCP. Doubt infectious or vascular cause of symptoms.  Patient is hemodynamically stable, in NAD, and able to ambulate in the ED. Evaluation does not show pathology that would require ongoing emergent intervention or inpatient treatment. I explained the diagnosis to the patient. Pain has been managed and has no complaints prior to discharge. Patient is comfortable with above plan and is stable for discharge at this time. All questions were answered prior to disposition. Strict return precautions for returning to the ED were discussed. Encouraged follow up with PCP.   An After Visit Summary was printed and given to the patient.   Portions of this note were generated with Lobby[employer]. Dictation errors may occur despite best attempts at proofreading.  Final  Clinical Impression(s) / ED Diagnoses Final diagnoses:  Strain of right shoulder, initial encounter    Rx / DC Orders ED Discharge Orders         Ordered    diclofenac Sodium (VOLTAREN) 1 % GEL  4 times daily     Discontinue  Reprint     12/03/19 Turon, Blandon, PA-C 12/03/19 Cane [place], [place], DO 12/03/19  1659  

## 2020-03-16 ENCOUNTER — Emergency Department (HOSPITAL_BASED_OUTPATIENT_CLINIC_OR_DEPARTMENT_OTHER)
Admission: EM | Admit: 2020-03-16 | Discharge: 2020-03-16 | Disposition: A | Discharge status: Home / Self Care | Payer: Self-pay | Attending: Emergency Medicine | Admitting: Emergency Medicine

## 2020-03-16 ENCOUNTER — Other Ambulatory Visit: Payer: Self-pay

## 2020-03-16 ENCOUNTER — Encounter (HOSPITAL_BASED_OUTPATIENT_CLINIC_OR_DEPARTMENT_OTHER): Payer: Self-pay | Admitting: Emergency Medicine

## 2020-03-16 DIAGNOSIS — Z79899 Other long term (current) drug therapy: Secondary | ICD-10-CM | POA: Insufficient documentation

## 2020-03-16 DIAGNOSIS — S80862A Insect bite (nonvenomous), left lower leg, initial encounter: Secondary | ICD-10-CM | POA: Insufficient documentation

## 2020-03-16 DIAGNOSIS — F17210 Nicotine dependence, cigarettes, uncomplicated: Secondary | ICD-10-CM | POA: Insufficient documentation

## 2020-03-16 DIAGNOSIS — W57XXXA Bitten or stung by nonvenomous insect and other nonvenomous arthropods, initial encounter: Secondary | ICD-10-CM | POA: Insufficient documentation

## 2020-03-16 DIAGNOSIS — S80861A Insect bite (nonvenomous), right lower leg, initial encounter: Secondary | ICD-10-CM | POA: Insufficient documentation

## 2020-03-16 MED ORDER — LORATADINE 10 MG PO TABS
10.0000 mg | ORAL_TABLET | Freq: Once | ORAL | Status: AC
  Administered 2020-03-16: 10 mg via ORAL
  Filled 2020-03-16: qty 1

## 2020-03-16 MED ORDER — HYDROCORTISONE 1 % EX CREA: TOPICAL_CREAM | CUTANEOUS | 0 refills | Status: DC

## 2020-03-16 NOTE — ED Provider Notes (Signed)
[facility] EMERGENCY DEPARTMENT Provider Note   CSN: [id number] Arrival date & time: 03/16/20  0443     History Chief Complaint  Patient presents with  . Insect Bite    FARAH LEPAK is a 39 y.o. female.  The history is provided by the patient.  Animal Bite Contact animal:  Insect Location:  Leg Leg injury location:  R lower leg and L lower leg Pain details:    Quality:  Itching   Severity:  Mild   Timing:  Intermittent   Progression:  Unchanged Incident location:  Outside Provoked: unprovoked   Notifications:  None Animal's rabies vaccination status:  Never received Animal in possession: no   Relieved by:  Nothing Worsened by:  Nothing Ineffective treatments:  None tried Associated symptoms: no fever, no numbness and no swelling        Past Medical History:  Diagnosis Date  . Anemia   . Anxiety   . Anxiety   . Dental abscess   . Depression   . GERD (gastroesophageal reflux disease)   . Insomnia   . Palpitation   . PTSD (post-traumatic stress disorder)     Patient Active Problem List   Diagnosis Date Noted  . Chest pain 04/23/2013  . Anemia   . High cholesterol   . Anxiety   . Depression   . Insomnia   . Palpitation   . GERD (gastroesophageal reflux disease)     Past Surgical History:  Procedure Laterality Date  . BREAST REDUCTION SURGERY    . CHOLECYSTECTOMY    . gallstone removal    . WISDOM TOOTH EXTRACTION       OB History   No obstetric history on file.     History reviewed. No pertinent family history.  Social History   Tobacco Use  . Smoking status: Current Some Day Smoker    Packs/day: 0.50    Types: Cigarettes  . Smokeless tobacco: Never Used  Vaping Use  . Vaping Use: Some days  Substance Use Topics  . Alcohol use: No  . Drug use: No    Home Medications Prior to Admission medications   Medication Sig Start Date End Date Taking? Authorizing Provider  amoxicillin (AMOXIL) 500 MG capsule Take 1 capsule  (500 mg total) by mouth 3 (three) times daily. 05/07/19   Robinson, [place] N, PA-C  diclofenac Sodium (VOLTAREN) 1 % GEL Apply 2 g topically 4 (four) times daily. 12/03/19   Khatri, Hina, PA-C  fluconazole (DIFLUCAN) 150 MG tablet Take 1 tablet (150 mg total) by mouth once as needed for up to 1 dose (yeast infection). 05/07/19   Robinson, [place] N, PA-C  hydrocortisone cream 1 % Apply to affected area 2 times daily 03/16/20   Laprecious Austill, MD    Allergies    Aspirin and Contrast media [iodinated diagnostic agents]  Review of Systems   Review of Systems  Constitutional: Negative for fever.  HENT: Negative for congestion, drooling and facial swelling.   Eyes: Negative for visual disturbance.  Respiratory: Negative for shortness of breath.   Gastrointestinal: Negative for abdominal pain.  Genitourinary: Negative for difficulty urinating.  Musculoskeletal: Negative for arthralgias.  Skin: Negative for pallor.  Neurological: Negative for numbness.  Psychiatric/Behavioral: Negative for agitation.  All other systems reviewed and are negative.   Physical Exam Updated Vital Signs BP 98/60   Pulse 66   Temp 98.1 F (36.7 C) (Oral)   Resp 16   SpO2 100%   Physical Exam  Vitals and nursing note reviewed.  Constitutional:      General: She is not in acute distress.    Appearance: Normal appearance.  HENT:     Head: Normocephalic and atraumatic.     Nose: Nose normal.  Eyes:     Conjunctiva/sclera: Conjunctivae normal.     Pupils: Pupils are equal, round, and reactive to light.  Cardiovascular:     Rate and Rhythm: Normal rate and regular rhythm.     Pulses: Normal pulses.     Heart sounds: Normal heart sounds.  Pulmonary:     Effort: Pulmonary effort is normal.     Breath sounds: Normal breath sounds.  Abdominal:     General: Abdomen is flat. Bowel sounds are normal.     Palpations: Abdomen is soft.     Tenderness: There is no abdominal tenderness. There is no guarding.    Musculoskeletal:        General: Normal range of motion.     Cervical back: Normal range of motion and neck supple.  Skin:    General: Skin is warm and dry.     Capillary Refill: Capillary refill takes less than 2 seconds.     Comments: 3 lesions on shins consistent with insect bites   Neurological:     General: No focal deficit present.     Mental Status: She is alert and oriented to person, place, and time.     Deep Tendon Reflexes: Reflexes normal.  Psychiatric:        Mood and Affect: Mood normal.        Behavior: Behavior normal.     ED Results / Procedures / Treatments   Labs (all labs ordered are listed, but only abnormal results are displayed) Labs Reviewed - No data to display  EKG None  Radiology No results found.  Procedures Procedures (including critical care time)  Medications Ordered in ED Medications  loratadine (CLARITIN) tablet 10 mg (has no administration in time range)    ED Course  I have reviewed the triage vital signs and the nursing notes.  Pertinent labs & imaging results that were available during my care of the patient were reviewed by me and considered in my medical decision making (see chart for details).   Insect bites.  No signs of allergic reaction.  You may treat with hydrocortisone cream.    VITORIA CONYER was evaluated in Emergency Department on 03/16/2020 for the symptoms described in the history of present illness. She was evaluated in the context of the global COVID-19 pandemic, which necessitated consideration that the patient might be at risk for infection with the SARS-CoV-2 virus that causes COVID-19. Institutional protocols and algorithms that pertain to the evaluation of patients at risk for COVID-19 are in a state of rapid change based on information released by regulatory bodies including the CDC and federal and state organizations. These policies and algorithms were followed during the patient's care in the ED.  Final  Clinical Impression(s) / ED Diagnoses Final diagnoses:  Insect bite of lower leg, unspecified laterality, initial encounter   Return for intractable cough, coughing up blood,fevers >100.4 unrelieved by medication, shortness of breath, intractable vomiting, chest pain, shortness of breath, weakness,numbness, changes in speech, facial asymmetry,abdominal pain, passing out,Inability to tolerate liquids or food, cough, altered mental status or any concerns. No signs of systemic illness or infection. The patient is nontoxic-appearing on exam and vital signs are within normal limits.   I have reviewed the triage vital  signs and the nursing notes. Pertinent labs &imaging results that were available during my care of the patient were reviewed by me and considered in my medical decision making (see chart for details).After history, exam, and medical workup I feel the patient has beenappropriately medically screened and is safe for discharge home. Pertinent diagnoses were discussed with the patient. Patient was given return precautions. Rx / DC Orders ED Discharge Orders         Ordered    hydrocortisone cream 1 %        03/16/20 0502           Kaytlin Burklow, MD 03/16/20 1025

## 2020-03-16 NOTE — ED Triage Notes (Signed)
Pt c/o bug bites to legs bilaterally and to back.

## 2020-07-29 ENCOUNTER — Emergency Department (HOSPITAL_BASED_OUTPATIENT_CLINIC_OR_DEPARTMENT_OTHER)
Admission: EM | Admit: 2020-07-29 | Discharge: 2020-07-29 | Disposition: A | Discharge status: Home / Self Care | Payer: Self-pay | Attending: Emergency Medicine | Admitting: Emergency Medicine

## 2020-07-29 ENCOUNTER — Encounter (HOSPITAL_BASED_OUTPATIENT_CLINIC_OR_DEPARTMENT_OTHER): Payer: Self-pay | Admitting: *Deleted

## 2020-07-29 DIAGNOSIS — J019 Acute sinusitis, unspecified: Secondary | ICD-10-CM | POA: Insufficient documentation

## 2020-07-29 DIAGNOSIS — H9201 Otalgia, right ear: Secondary | ICD-10-CM | POA: Insufficient documentation

## 2020-07-29 DIAGNOSIS — F17210 Nicotine dependence, cigarettes, uncomplicated: Secondary | ICD-10-CM | POA: Insufficient documentation

## 2020-07-29 MED ORDER — AMOXICILLIN-POT CLAVULANATE 875-125 MG PO TABS: 1.0000 | ORAL_TABLET | Freq: Two times a day (BID) | ORAL | 0 refills | Status: DC

## 2020-07-29 MED ORDER — CIPRO HC 0.2-1 % OT SUSP: 3.0000 [drp] | Freq: Two times a day (BID) | OTIC | 0 refills | Status: DC

## 2020-07-29 NOTE — ED Provider Notes (Signed)
[facility] EMERGENCY DEPARTMENT Provider Note   CSN: [id number] Arrival date & time: 07/29/20  1435     History Chief Complaint  Patient presents with  . Otalgia    Patricia Wilcox is a 40 y.o. female.  Patient presents to the emergency department for evaluation of nasal congestion, runny nose, and right ear pain.  She states that the symptoms have been ongoing for about 10 days.  The ear pain has gotten much worse over the past couple of days.  No fevers, sore throat, nausea or vomiting.  She states that she has pressure in her face.  She has been putting a cotton ball in her ear to provide relief because air makes it worse.  Movement makes it feel like there is something shaking in her ear.  She has reported mild watery drainage.  No dental pain.  She has using OTC meds for pain.        Past Medical History:  Diagnosis Date  . Anemia   . Anxiety   . Anxiety   . Dental abscess   . Depression   . GERD (gastroesophageal reflux disease)   . Insomnia   . Palpitation   . PTSD (post-traumatic stress disorder)     Patient Active Problem List   Diagnosis Date Noted  . Chest pain 04/23/2013  . Anemia   . High cholesterol   . Anxiety   . Depression   . Insomnia   . Palpitation   . GERD (gastroesophageal reflux disease)     Past Surgical History:  Procedure Laterality Date  . BREAST REDUCTION SURGERY    . CHOLECYSTECTOMY    . gallstone removal    . WISDOM TOOTH EXTRACTION       OB History   No obstetric history on file.     History reviewed. No pertinent family history.  Social History   Tobacco Use  . Smoking status: Current Some Day Smoker    Packs/day: 0.50    Types: Cigarettes  . Smokeless tobacco: Never Used  Vaping Use  . Vaping Use: Some days  Substance Use Topics  . Alcohol use: No  . Drug use: No    Home Medications Prior to Admission medications   Medication Sig Start Date End Date Taking? Authorizing Provider   amoxicillin-clavulanate (AUGMENTIN) 875-125 MG tablet Take 1 tablet by mouth every 12 (twelve) hours. 07/29/20  Yes Renne Crigler, PA-C  cetirizine (ZYRTEC) 10 MG tablet Take by mouth. 08/09/16  Yes [provider]  ciprofloxacin-hydrocortisone (CIPRO HC) OTIC suspension Place 3 drops into the right ear 2 (two) times daily. 07/29/20  Yes Renne Crigler, PA-C  diazepam (VALIUM) 2 MG tablet Take 30 minutes before MRI. Repeat if needed. 10/04/15  Yes [provider]  Cholecalciferol 25 MCG (1000 UT) tablet Take by mouth.    [provider]  cyclobenzaprine (FLEXERIL) 10 MG tablet Take 10 mg by mouth 3 (three) times daily as needed. 06/26/20   [provider]    Allergies    Aspirin and Contrast media [iodinated diagnostic agents]  Review of Systems   Review of Systems  Constitutional: Negative for chills, fatigue and fever.  HENT: Positive for congestion, ear discharge, ear pain and sinus pressure. Negative for hearing loss, rhinorrhea and sore throat.   Eyes: Negative for redness.  Respiratory: Negative for cough and wheezing.   Gastrointestinal: Negative for abdominal pain, diarrhea, nausea and vomiting.  Genitourinary: Negative for dysuria.  Musculoskeletal: Negative for myalgias and neck  stiffness.  Skin: Negative for rash.  Neurological: Positive for headaches.  Hematological: Negative for adenopathy.    Physical Exam Updated Vital Signs BP 102/69 (BP Location: Left Arm)   Pulse 86   Temp 98.4 F (36.9 C) (Oral)   Resp 16   Ht 5\' 3"  (1.6 m)   Wt 56.7 kg   SpO2 100%   BMI 22.14 kg/m   Physical Exam Vitals and nursing note reviewed.  Constitutional:      Appearance: She is well-developed and well-nourished.  HENT:     Head: Normocephalic and atraumatic.     Jaw: No trismus.     Right Ear: Ear canal and external ear normal. No decreased hearing noted. Tympanic membrane is injected, erythematous and bulging. Tympanic membrane is not retracted.      Left Ear: Tympanic membrane, ear canal and external ear normal.     Ears:     Comments: Patient does not have significant pain with tugging on the pinna however inserting the speculum into the ear causes pain and she appears uncomfortable.    Nose: Mucosal edema and congestion present. No nasal tenderness or rhinorrhea.     Right Sinus: Frontal sinus tenderness present.     Left Sinus: Frontal sinus tenderness present.     Mouth/Throat:     Mouth: Oropharynx is clear and moist and mucous membranes are normal. Mucous membranes are not dry. No oral lesions.     Pharynx: Uvula midline. No oropharyngeal exudate, posterior oropharyngeal edema, posterior oropharyngeal erythema or uvula swelling.     Tonsils: No tonsillar abscesses.  Eyes:     General:        Right eye: No discharge.        Left eye: No discharge.     Conjunctiva/sclera: Conjunctivae normal.  Cardiovascular:     Rate and Rhythm: Normal rate and regular rhythm.     Heart sounds: Normal heart sounds.  Pulmonary:     Effort: Pulmonary effort is normal. No respiratory distress.     Breath sounds: Normal breath sounds. No wheezing or rales.  Abdominal:     Palpations: Abdomen is soft.     Tenderness: There is no abdominal tenderness.  Musculoskeletal:     Cervical back: Normal range of motion and neck supple.  Lymphadenopathy:     Cervical: No cervical adenopathy.  Skin:    General: Skin is warm and dry.  Neurological:     Mental Status: She is alert.  Psychiatric:        Mood and Affect: Mood and affect normal.     ED Results / Procedures / Treatments   Labs (all labs ordered are listed, but only abnormal results are displayed) Labs Reviewed - No data to display  EKG None  Radiology No results found.  Procedures Procedures   Medications Ordered in ED Medications - No data to display  ED Course  I have reviewed the triage vital signs and the nursing notes.  Pertinent labs & imaging results that were  available during my care of the patient were reviewed by me and considered in my medical decision making (see chart for details).  Patient seen and examined.  Discussed treatment options.  Will provide Cipro eardrops and Augmentin.  Patient in agreement.  Encouraged return with worsening symptoms, continued use of OTC meds for pain.  Vital signs reviewed and are as follows: BP 102/69 (BP Location: Left Arm)   Pulse 86   Temp 98.4 F (36.9 C) (Oral)  Resp 16   Ht 5\' 3"  (1.6 m)   Wt 56.7 kg   SpO2 100%   BMI 22.14 kg/m     MDM Rules/Calculators/A&P                          Patient with right earache.  She has some inflammation in the ear canal suggestive of an otitis externa but also bulging and mild injection of the TM concerning for otitis media.  Treatment plan as above.  She also has signs and symptoms of sinusitis > 10 days.    Final Clinical Impression(s) / ED Diagnoses Final diagnoses:  Right ear pain  Acute sinusitis, recurrence not specified, unspecified location    Rx / DC Orders ED Discharge Orders         Ordered    amoxicillin-clavulanate (AUGMENTIN) 875-125 MG tablet  Every 12 hours        07/29/20 1535    ciprofloxacin-hydrocortisone (CIPRO HC) OTIC suspension  2 times daily        07/29/20 1535           09/26/20, Renne Crigler 07/29/20 1541    09/26/20, MD 07/29/20 1927

## 2020-07-29 NOTE — Discharge Instructions (Signed)
Please read and follow all provided instructions.  Your diagnoses today include:  1. Right ear pain   2. Acute sinusitis, recurrence not specified, unspecified location     Tests performed today include:  Vital signs. See below for your results today.   Medications prescribed:   Augmentin - antibiotic  You have been prescribed an antibiotic medicine: take the entire course of medicine even if you are feeling better. Stopping early can cause the antibiotic not to work.   Ciprodex ear drops - ear drop for outer ear infection  Take any prescribed medications only as directed.  Home care instructions:  Follow any educational materials contained in this packet.  BE VERY CAREFUL not to take multiple medicines containing Tylenol (also called acetaminophen). Doing so can lead to an overdose which can damage your liver and cause liver failure and possibly death.   Follow-up instructions: Please follow-up with your primary care provider in the next 3 days for further evaluation of your symptoms.   Return instructions:   Please return to the Emergency Department if you experience worsening symptoms.   Please return if you have any other emergent concerns.  Additional Information:  Your vital signs today were: BP 102/69 (BP Location: Left Arm)   Pulse 86   Temp 98.4 F (36.9 C) (Oral)   Resp 16   Ht 5\' 3"  (1.6 m)   Wt 56.7 kg   SpO2 100%   BMI 22.14 kg/m  If your blood pressure (BP) was elevated above 135/85 this visit, please have this repeated by your doctor within one month. --------------

## 2020-07-29 NOTE — ED Notes (Signed)
MED LIST REVIEWED, STATES NOT TAKING ANY MEDICATION AT THIS TIME.

## 2020-07-29 NOTE — ED Triage Notes (Signed)
Rt ear pain, onset approx 1 week ago, having drainage from rt ear as well. Having HA, rt side of head, face, jaw hurts. Denies having fevers

## 2020-08-15 ENCOUNTER — Other Ambulatory Visit: Payer: Self-pay

## 2020-08-15 ENCOUNTER — Encounter (HOSPITAL_BASED_OUTPATIENT_CLINIC_OR_DEPARTMENT_OTHER): Payer: Self-pay | Admitting: Emergency Medicine

## 2020-08-15 DIAGNOSIS — H9201 Otalgia, right ear: Secondary | ICD-10-CM | POA: Insufficient documentation

## 2020-08-15 DIAGNOSIS — Z5321 Procedure and treatment not carried out due to patient leaving prior to being seen by health care provider: Secondary | ICD-10-CM | POA: Insufficient documentation

## 2020-08-15 NOTE — ED Triage Notes (Signed)
C/o right ear pain

## 2020-08-16 ENCOUNTER — Emergency Department (HOSPITAL_BASED_OUTPATIENT_CLINIC_OR_DEPARTMENT_OTHER)
Admission: EM | Admit: 2020-08-16 | Discharge: 2020-08-16 | Disposition: A | Discharge status: Home / Self Care | Payer: Self-pay | Attending: Emergency Medicine | Admitting: Emergency Medicine

## 2020-08-16 DIAGNOSIS — H9201 Otalgia, right ear: Secondary | ICD-10-CM

## 2020-08-16 NOTE — ED Provider Notes (Signed)
Patient presented for ear pain.  Patient eloped prior to my evaluation.   Nira Conn, MD 08/16/20 [phone]

## 2020-08-16 NOTE — ED Notes (Signed)
Pt awaiting to be seen by MD and can be heard throughout ED on phone cursing and yelling about having to wait so long. MD aware.

## 2020-08-16 NOTE — ED Notes (Signed)
Called to lobby to talk with pt. Pt complaining that people are being called ahead of her. Explained to pt that only 1 person had been called ahead of her due to that patient's severity. Pt expressed that I was not telling the truth and would wait and talk to someone else.

## 2020-08-16 NOTE — ED Notes (Signed)
Pt continues to yell and slammed door to her room. Security called to standby.

## 2021-10-07 ENCOUNTER — Emergency Department (HOSPITAL_BASED_OUTPATIENT_CLINIC_OR_DEPARTMENT_OTHER)
Admission: EM | Admit: 2021-10-07 | Discharge: 2021-10-07 | Disposition: A | Discharge status: Home / Self Care | Payer: Self-pay | Attending: Emergency Medicine | Admitting: Emergency Medicine

## 2021-10-07 ENCOUNTER — Encounter (HOSPITAL_BASED_OUTPATIENT_CLINIC_OR_DEPARTMENT_OTHER): Payer: Self-pay | Admitting: Emergency Medicine

## 2021-10-07 ENCOUNTER — Emergency Department (HOSPITAL_BASED_OUTPATIENT_CLINIC_OR_DEPARTMENT_OTHER): Payer: Self-pay

## 2021-10-07 ENCOUNTER — Other Ambulatory Visit: Payer: Self-pay

## 2021-10-07 DIAGNOSIS — H5789 Other specified disorders of eye and adnexa: Secondary | ICD-10-CM

## 2021-10-07 DIAGNOSIS — H53142 Visual discomfort, left eye: Secondary | ICD-10-CM | POA: Insufficient documentation

## 2021-10-07 DIAGNOSIS — M25522 Pain in left elbow: Secondary | ICD-10-CM | POA: Insufficient documentation

## 2021-10-07 DIAGNOSIS — W010XXA Fall on same level from slipping, tripping and stumbling without subsequent striking against object, initial encounter: Secondary | ICD-10-CM | POA: Insufficient documentation

## 2021-10-07 DIAGNOSIS — S40022A Contusion of left upper arm, initial encounter: Secondary | ICD-10-CM | POA: Insufficient documentation

## 2021-10-07 DIAGNOSIS — M25512 Pain in left shoulder: Secondary | ICD-10-CM | POA: Insufficient documentation

## 2021-10-07 MED ORDER — IBUPROFEN 800 MG PO TABS
800.0000 mg | ORAL_TABLET | Freq: Once | ORAL | Status: AC
  Administered 2021-10-07: 800 mg via ORAL
  Filled 2021-10-07: qty 1

## 2021-10-07 MED ORDER — CETIRIZINE HCL 10 MG PO TABS: 10.0000 mg | ORAL_TABLET | Freq: Every day | ORAL | 0 refills | Status: AC

## 2021-10-07 MED ORDER — NAPHAZOLINE-PHENIRAMINE 0.025-0.3 % OP SOLN: 1.0000 [drp] | OPHTHALMIC | 0 refills | Status: AC | PRN

## 2021-10-07 NOTE — ED Provider Notes (Signed)
?[facility] EMERGENCY DEPARTMENT ?Provider Note ? ? ?CSN: [id number] ?Arrival date & time: 10/07/21  1723 ? ?  ? ?History ? ?Chief Complaint  ?Patient presents with  ? Fall  ? ? ?Patricia Wilcox is a 41 y.o. female here presenting with fall.  Patient states that she slipped on grease and fell onto the left arm about 6 hours ago.  She states that she took some Tylenol but is very sore in her left upper arm.  Denies any head injury or other injuries. Of note, patient also noticed some irritation in the left eye for the last several weeks.  She states that she has a new fake eyelashes.  She states that she has some clear discharge from the left eye.  Denies any purulent discharge or fevers.  ? ?The history is provided by the patient.  ? ?  ? ?Home Medications ?Prior to Admission medications   ?Not on File  ?   ? ?Allergies    ?Aspirin and Contrast media [iodinated contrast media]   ? ?Review of Systems   ?Review of Systems  ?Eyes:  Positive for pain.  ?Musculoskeletal:   ?     L arm pain   ?All other systems reviewed and are negative. ? ?Physical Exam ?Updated Vital Signs ?BP 112/74   Pulse 78   Temp 98.6 ?F (37 ?C) (Oral)   Resp 16   Ht 5\' 5"  (1.651 m)   Wt 54.4 kg   LMP 09/30/2021   SpO2 100%   BMI 19.97 kg/m?  ?Physical Exam ?Vitals and nursing note reviewed.  ?Constitutional:   ?   Appearance: Normal appearance.  ?HENT:  ?   Head: Normocephalic.  ?   Nose: Nose normal.  ?   Mouth/Throat:  ?   Mouth: Mucous membranes are moist.  ?Eyes:  ?   Comments: Patient has fake eye lashes bilaterally.  Left eye with mild erythema.  There is no obvious purulent discharge.  There is mild eyelid swelling the left side.  Extraocular movements are intact.   ?Cardiovascular:  ?   Rate and Rhythm: Normal rate and regular rhythm.  ?   Pulses: Normal pulses.  ?   Heart sounds: Normal heart sounds.  ?Pulmonary:  ?   Effort: Pulmonary effort is normal.  ?   Breath sounds: Normal breath sounds.  ?Abdominal:  ?   General:  Abdomen is flat.  ?   Palpations: Abdomen is soft.  ?Musculoskeletal:  ?   Cervical back: Normal range of motion and neck supple.  ?   Comments: Mild diffuse tenderness from the left shoulder to the left elbow.  No forearm tenderness.  Able to range the shoulder and elbow.  Neurovascular intact in the left upper extremity.  No scapular tenderness and no rib or abdominal tenderness or other extremity trauma  ?Skin: ?   General: Skin is warm.  ?   Capillary Refill: Capillary refill takes less than 2 seconds.  ?Neurological:  ?   General: No focal deficit present.  ?   Mental Status: She is alert and oriented to person, place, and time.  ?Psychiatric:     ?   Mood and Affect: Mood normal.     ?   Behavior: Behavior normal.  ? ? ?ED Results / Procedures / Treatments   ?Labs ?(all labs ordered are listed, but only abnormal results are displayed) ?Labs Reviewed - No data to display ? ?EKG ?None ? ?Radiology ?No results found. ? ?Procedures ?  Procedures  ? ? ?Medications Ordered in ED ?Medications  ?ibuprofen (ADVIL) tablet 800 mg (has no administration in time range)  ? ? ?ED Course/ Medical Decision Making/ A&P ?  ?                        ?Medical Decision Making ?Patricia Wilcox is a 41 y.o. female here presenting with fall with left upper arm pain.  Patient also has left eye pain as well.  I think the eye pain is likely secondary to irritation from her fake leg lashes versus seasonal allergies.  No obvious corneal abrasions or conjunctivitis.  I think she can be started on Visine drops and Zyrtec.  Patient also fell and has left arm pain so we will get left humerus x-rays ? ?6:28 PM ?X-ray showed no fracture.  She can take Tylenol Motrin for pain. She can also use Visine and Zyrtec for the left eye ? ?Problems Addressed: ?Arm contusion, left, initial encounter: acute illness or injury ?Irritation of left eye: acute illness or injury ? ?Amount and/or Complexity of Data Reviewed ?Radiology: ordered and independent  interpretation performed. Decision-making details documented in ED Course. ? ?Risk ?Prescription drug management. ? ?Final Clinical Impression(s) / ED Diagnoses ?Final diagnoses:  ?None  ? ? ?Rx / DC Orders ?ED Discharge Orders   ? ? None  ? ?  ? ? ?  ?Charlynne Pander, MD ?10/07/21 1829 ? ?

## 2021-10-07 NOTE — ED Triage Notes (Signed)
Pt slipped in grease about 6 hrs ago; c/o LUE pain ?

## 2021-10-07 NOTE — Discharge Instructions (Addendum)
Use visine drops to left eye and try zyrtec  ? ?Your x-rays did not showed any fractures.  Take Tylenol or Motrin for pain ? ?See your doctor for follow-up.  Consider following up with the eye doctor.  ? ?Return to ER if you have worse arm pain, purulent discharge from the eye, eye swelling, fever  ?

## 2022-03-29 ENCOUNTER — Other Ambulatory Visit: Payer: Self-pay

## 2022-03-29 ENCOUNTER — Encounter (HOSPITAL_BASED_OUTPATIENT_CLINIC_OR_DEPARTMENT_OTHER): Payer: Self-pay | Admitting: Urology

## 2022-03-29 ENCOUNTER — Emergency Department (HOSPITAL_BASED_OUTPATIENT_CLINIC_OR_DEPARTMENT_OTHER)
Admission: EM | Admit: 2022-03-29 | Discharge: 2022-03-29 | Disposition: A | Discharge status: Home / Self Care | Payer: Self-pay | Attending: Emergency Medicine | Admitting: Emergency Medicine

## 2022-03-29 DIAGNOSIS — K029 Dental caries, unspecified: Secondary | ICD-10-CM | POA: Insufficient documentation

## 2022-03-29 DIAGNOSIS — K0889 Other specified disorders of teeth and supporting structures: Secondary | ICD-10-CM

## 2022-03-29 MED ORDER — AMOXICILLIN-POT CLAVULANATE 875-125 MG PO TABS: 1.0000 | ORAL_TABLET | Freq: Two times a day (BID) | ORAL | 0 refills | Status: AC

## 2022-03-29 MED ORDER — NAPROXEN 500 MG PO TABS: 500.0000 mg | ORAL_TABLET | Freq: Two times a day (BID) | ORAL | 0 refills | Status: AC

## 2022-03-29 NOTE — ED Provider Notes (Signed)
[place] EMERGENCY DEPARTMENT Provider Note   CSN: [id number] Arrival date & time: 03/29/22  1432     History  Chief Complaint  Patient presents with   Dental Pain    Patricia Wilcox is a 41 y.o. female.  Patient presents to the emergency department today for evaluation of worsening right-sided upper dental pain ongoing over the past 2 days.  She has a known cavity in the area of the pain.  No facial swelling or difficulty swallowing.  No fevers.  Pain radiates to her ear.  She states that she had an illness and was vomiting prior to this and this may have exacerbated her symptoms.  Currently does not have a dentist.       Home Medications Prior to Admission medications   Medication Sig Start Date End Date Taking? Authorizing Provider  amoxicillin-clavulanate (AUGMENTIN) 875-125 MG tablet Take 1 tablet by mouth every 12 (twelve) hours. 03/29/22  Yes Carlisle Cater, PA-C  naproxen (NAPROSYN) 500 MG tablet Take 1 tablet (500 mg total) by mouth 2 (two) times daily. 03/29/22  Yes Carlisle Cater, PA-C  cetirizine (ZYRTEC ALLERGY) 10 MG tablet Take 1 tablet (10 mg total) by mouth daily. 10/07/21   Drenda Freeze, MD  naphazoline-pheniramine (NAPHCON-A) 0.025-0.3 % ophthalmic solution Place 1 drop into the left eye every 4 (four) hours as needed for eye irritation. 10/07/21   Drenda Freeze, MD      Allergies    Aspirin and Contrast media [iodinated contrast media]    Review of Systems   Review of Systems  Physical Exam Updated Vital Signs BP 126/73 (BP Location: Right Arm)   Pulse 60   Temp 98.2 F (36.8 C) (Oral)   Resp 18   Ht 5\' 5"  (1.651 m)   Wt 54.4 kg   LMP 03/29/2022 (Approximate)   SpO2 100%   BMI 19.96 kg/m  Physical Exam Vitals and nursing note reviewed.  Constitutional:      Appearance: She is well-developed.  HENT:     Head: Normocephalic and atraumatic.     Jaw: No trismus.     Right Ear: Tympanic membrane, ear canal and external ear  normal.     Left Ear: External ear normal.     Nose: Nose normal.     Mouth/Throat:     Mouth: Mucous membranes are moist.     Dentition: Abnormal dentition. Dental caries present. No dental abscesses.     Pharynx: Uvula midline. No uvula swelling.     Tonsils: No tonsillar abscesses.     Comments: Patient with cavity noted in tooth #2, tenderness of the gumline without gross abscess or drainage. Eyes:     Conjunctiva/sclera: Conjunctivae normal.  Neck:     Comments: No neck swelling or Ludwig's angina Musculoskeletal:     Cervical back: Normal range of motion and neck supple.  Lymphadenopathy:     Cervical: No cervical adenopathy.  Skin:    General: Skin is warm and dry.  Neurological:     Mental Status: She is alert.     ED Results / Procedures / Treatments   Labs (all labs ordered are listed, but only abnormal results are displayed) Labs Reviewed - No data to display  EKG None  Radiology No results found.  Procedures Procedures    Medications Ordered in ED Medications - No data to display  ED Course/ Medical Decision Making/ A&P    Patient seen and examined. History obtained directly from patient.  Labs/EKG: None ordered.  Imaging: Independently reviewed and interpreted.  This included: None ordered.  Medications/Fluids: None ordered  Most recent vital signs reviewed and are as follows: BP 126/73 (BP Location: Right Arm)   Pulse 60   Temp 98.2 F (36.8 C) (Oral)   Resp 18   Ht 5\' 5"  (1.651 m)   Wt 54.4 kg   LMP 03/29/2022 (Approximate)   SpO2 100%   BMI 19.96 kg/m   Initial impression: Dental pain  Home treatment plan: Prescription for Augmentin, naproxen.  Return instructions discussed with patient: Return with worsening swelling, difficulty breathing or swallowing, fever  Follow-up instructions discussed with patient: Follow-up with dentist when able, referral given.                          Medical Decision Making Risk Prescription  drug management.   Patient presents for dental pain. They do not have a fever and do not appear septic. Exam unconcerning for Ludwig's angina or other deep tissue infection in neck and I do not feel that advanced imaging is indicated at this time. Low suspicion for PTA, RPA, epiglottis based on exam.   Patient will be treated for dental infection with antibiotic. Encouraged tylenol/NSAIDs as prescribed or as directed on the packaging for pain. Encouraged follow-up with a dentist for definitive and long-term management.          Final Clinical Impression(s) / ED Diagnoses Final diagnoses:  Pain, dental    Rx / DC Orders ED Discharge Orders          Ordered    amoxicillin-clavulanate (AUGMENTIN) 875-125 MG tablet  Every 12 hours        03/29/22 1457    naproxen (NAPROSYN) 500 MG tablet  2 times daily        03/29/22 1457              05/29/22, PA-C 03/29/22 1501    05/29/22, DO 04/01/22 0831

## 2022-03-29 NOTE — Discharge Instructions (Signed)
Please read and follow all provided instructions.  Your diagnoses today include:  1. Pain, dental    The exam and treatment you received today has been provided on an emergency basis only. This is not a substitute for complete medical or dental care.  Tests performed today include: Vital signs. See below for your results today.   Medications prescribed:  Naproxen - anti-inflammatory pain medication Do not exceed 500mg  naproxen every 12 hours, take with food  You have been prescribed an anti-inflammatory medication or NSAID. Take with food. Take smallest effective dose for the shortest duration needed for your pain. Stop taking if you experience stomach pain or vomiting.   Augmentin - antibiotic  You have been prescribed an antibiotic medicine: take the entire course of medicine even if you are feeling better. Stopping early can cause the antibiotic not to work.  Take any prescribed medications only as directed.  Home care instructions:  Follow any educational materials contained in this packet.  Follow-up instructions: Please follow-up with your dentist for further evaluation of your symptoms.   Dental Assistance: See attached dental referral and/or resource guide.   Return instructions:  Please return to the Emergency Department if you experience worsening symptoms. Please return if you develop a fever, you develop more swelling in your face or neck, you have trouble breathing or swallowing food. Please return if you have any other emergent concerns.  Additional Information:  Your vital signs today were: BP 126/73 (BP Location: Right Arm)   Pulse 60   Temp 98.2 F (36.8 C) (Oral)   Resp 18   Ht 5\' 5"  (1.651 m)   Wt 54.4 kg   LMP 03/29/2022 (Approximate)   SpO2 100%   BMI 19.96 kg/m  If your blood pressure (BP) was elevated above 135/85 this visit, please have this repeated by your doctor within one month. --------------

## 2022-03-29 NOTE — ED Triage Notes (Signed)
Right uppper dental pain x 2 days  Pt reports no dentist established Denies any fever, states pain radiating to jaw and ear

## 2022-03-29 NOTE — ED Notes (Signed)
ED Provider at bedside.
